# Patient Record
Sex: Male | Born: 1960
Health system: Southern US, Community
[De-identification: ages and names within clinical notes are randomized; demographics above are authoritative.]

## PROBLEM LIST (undated history)

## (undated) DIAGNOSIS — K429 Umbilical hernia without obstruction or gangrene: Secondary | ICD-10-CM

## (undated) DIAGNOSIS — I341 Nonrheumatic mitral (valve) prolapse: Secondary | ICD-10-CM

## (undated) DIAGNOSIS — C801 Malignant (primary) neoplasm, unspecified: Secondary | ICD-10-CM

## (undated) DIAGNOSIS — Z8249 Family history of ischemic heart disease and other diseases of the circulatory system: Secondary | ICD-10-CM

## (undated) DIAGNOSIS — E119 Type 2 diabetes mellitus without complications: Secondary | ICD-10-CM

## (undated) DIAGNOSIS — E785 Hyperlipidemia, unspecified: Secondary | ICD-10-CM

## (undated) DIAGNOSIS — R079 Chest pain, unspecified: Secondary | ICD-10-CM

## (undated) DIAGNOSIS — I77810 Thoracic aortic ectasia: Secondary | ICD-10-CM

## (undated) HISTORY — DX: Nonrheumatic mitral (valve) prolapse: I34.1

## (undated) HISTORY — PX: COLONOSCOPY: SHX174

## (undated) HISTORY — DX: Thoracic aortic ectasia: I77.810

## (undated) HISTORY — DX: Type 2 diabetes mellitus without complications: E11.9

## (undated) HISTORY — PX: HERNIA REPAIR: SHX51

## (undated) HISTORY — DX: Hyperlipidemia, unspecified: E78.5

## (undated) HISTORY — DX: Umbilical hernia without obstruction or gangrene: K42.9

## (undated) HISTORY — DX: Chest pain, unspecified: R07.9

## (undated) HISTORY — DX: Family history of ischemic heart disease and other diseases of the circulatory system: Z82.49

## (undated) HISTORY — PX: NECK SURGERY: SHX720

## (undated) HISTORY — DX: Malignant (primary) neoplasm, unspecified: C80.1

## (undated) HISTORY — PX: PLANTAR FASCIA RELEASE: SHX2239

---

## 1992-07-08 HISTORY — PX: SEPTOPLASTY: SUR1290

## 2001-12-22 ENCOUNTER — Encounter: Admission: RE | Admit: 2001-12-22 | Discharge: 2001-12-22 | Payer: Self-pay | Admitting: *Deleted

## 2001-12-22 ENCOUNTER — Encounter: Payer: Self-pay | Admitting: *Deleted

## 2012-05-20 ENCOUNTER — Encounter (INDEPENDENT_AMBULATORY_CARE_PROVIDER_SITE_OTHER): Payer: Self-pay | Admitting: Surgery

## 2012-05-20 ENCOUNTER — Ambulatory Visit (INDEPENDENT_AMBULATORY_CARE_PROVIDER_SITE_OTHER): Payer: BC Managed Care – PPO | Admitting: Surgery

## 2012-05-20 VITALS — BP 122/68 | HR 70 | Temp 97.9°F | Resp 18 | Ht 74.0 in | Wt 273.2 lb

## 2012-05-20 DIAGNOSIS — K429 Umbilical hernia without obstruction or gangrene: Secondary | ICD-10-CM

## 2012-05-20 NOTE — Patient Instructions (Signed)
If you decide you want to have the umbilical hernia fixed, call our office and we can set this up over the telephone.

## 2012-05-20 NOTE — Progress Notes (Signed)
NAME: Preston Gamble DOB: 1960/07/31 MRN: [id number]                                                                                      DATE: 05/20/2012  PCP: Georgann Housekeeper, MD Referring Provider: No ref. provider found  IMPRESSION:  Small  Reducible umbilical hernia  PLAN:   Discussed repair, risks etc. This is small and unlikely to have an issue with incarceration, so I told him it can be left alone. He does have some pain in it when he exercises so is thinking about having it repaired, but wants to discuss with his wife before deciding                 CC:  Chief Complaint  Patient presents with  . Umbilical Hernia    HPI:  Preston Gamble is a 51 y.o.  male who presents for evaluation of Umbilical hernia.he recently saw his dermatologist who noted a hernia pointed out to him and suggested he obtain surgical consultation. He has a little discomfort in it when he does certain exercises including sit-ups and lifting weights. He's been aware of it for 5 or 6 years but doesn't think it has gotten any bigger. If he is not exercising he really doesn't have many problems.  PMH:  has a past medical history of Chest pain and Umbilical hernia.  PSH:   has no past surgical history on file.  ALLERGIES:  Not on File  MEDICATIONS: Current outpatient prescriptions:glipiZIDE (GLUCOTROL XL) 5 MG 24 hr tablet, Twice daily., Disp: , Rfl: ;  simvastatin (ZOCOR) 20 MG tablet, Daily., Disp: , Rfl:   ROS: He has filled out our 12 point review of systems and it is negative . EXAM:    Vital signs:BP 122/68  Pulse 70  Temp 97.9 F (36.6 C) (Temporal)  Resp 18  Ht 6\' 2"  (1.88 m)  Wt 273 lb 4 oz (123.945 kg)  BMI 35.08 kg/m2 General: Patient alert oriented generally healthy-appearing, NAD. Abdomen: Visible umbilical hernia. Small defect is about a centimeter. It reduces easily and I think has omentum or preperitoneal fat. His abdomen is otherwise benign with no masses tenderness or  organomegaly.  DATA REVIEWED:  No other information available.    Grethel Zenk J 05/20/2012  CC: No ref. provider found, Georgann Housekeeper, MD

## 2012-06-03 ENCOUNTER — Other Ambulatory Visit (INDEPENDENT_AMBULATORY_CARE_PROVIDER_SITE_OTHER): Payer: Self-pay | Admitting: Surgery

## 2012-06-12 ENCOUNTER — Ambulatory Visit: Payer: Self-pay | Admitting: Internal Medicine

## 2012-06-12 ENCOUNTER — Ambulatory Visit: Payer: Self-pay

## 2012-06-12 VITALS — BP 115/75 | HR 77 | Temp 97.8°F | Resp 18 | Ht 74.0 in | Wt 273.0 lb

## 2012-06-12 DIAGNOSIS — R200 Anesthesia of skin: Secondary | ICD-10-CM

## 2012-06-12 DIAGNOSIS — R209 Unspecified disturbances of skin sensation: Secondary | ICD-10-CM

## 2012-06-12 DIAGNOSIS — M542 Cervicalgia: Secondary | ICD-10-CM

## 2012-06-12 DIAGNOSIS — Z23 Encounter for immunization: Secondary | ICD-10-CM

## 2012-06-12 MED ORDER — METHOCARBAMOL 750 MG PO TABS: 750.0000 mg | ORAL_TABLET | Freq: Four times a day (QID) | ORAL | Status: DC

## 2012-06-12 MED ORDER — PREDNISONE 10 MG PO TABS: ORAL_TABLET | ORAL | Status: DC

## 2012-06-12 NOTE — Patient Instructions (Signed)
Prednisone tablets What is this medicine? PREDNISONE (PRED ni sone) is a corticosteroid. It is commonly used to treat inflammation of the skin, joints, lungs, and other organs. Common conditions treated include asthma, allergies, and arthritis. It is also used for other conditions, such as blood disorders and diseases of the adrenal glands. This medicine may be used for other purposes; ask your health care provider or pharmacist if you have questions. What should I tell my health care provider before I take this medicine? They need to know if you have any of these conditions: -Cushing's syndrome -diabetes -glaucoma -heart disease -high blood pressure -infection (especially a virus infection such as chickenpox, cold sores, or herpes) -kidney disease -liver disease -mental illness -myasthenia gravis -osteoporosis -seizures -stomach or intestine problems -thyroid disease -an unusual or allergic reaction to lactose, prednisone, other medicines, foods, dyes, or preservatives -pregnant or trying to get pregnant -breast-feeding How should I use this medicine? Take this medicine by mouth with a Kieara Schwark of water. Follow the directions on the prescription label. Take this medicine with food. If you are taking this medicine once a day, take it in the morning. Do not take more medicine than you are told to take. Do not suddenly stop taking your medicine because you may develop a severe reaction. Your doctor will tell you how much medicine to take. If your doctor wants you to stop the medicine, the dose may be slowly lowered over time to avoid any side effects. Talk to your pediatrician regarding the use of this medicine in children. Special care may be needed. Overdosage: If you think you have taken too much of this medicine contact a poison control center or emergency room at once. NOTE: This medicine is only for you. Do not share this medicine with others. What if I miss a dose? If you miss a dose,  take it as soon as you can. If it is almost time for your next dose, talk to your doctor or health care professional. You may need to miss a dose or take an extra dose. Do not take double or extra doses without advice. What may interact with this medicine? Do not take this medicine with any of the following medications: -metyrapone -mifepristone This medicine may also interact with the following medications: -aminoglutethimide -amphotericin B -aspirin and aspirin-like medicines -barbiturates -certain medicines for diabetes, like glipizide or glyburide -cholestyramine -cholinesterase inhibitors -cyclosporine -digoxin -diuretics -ephedrine -male hormones, like estrogens and birth control pills -isoniazid -ketoconazole -NSAIDS, medicines for pain and inflammation, like ibuprofen or naproxen -phenytoin -rifampin -toxoids -vaccines -warfarin This list may not describe all possible interactions. Give your health care provider a list of all the medicines, herbs, non-prescription drugs, or dietary supplements you use. Also tell them if you smoke, drink alcohol, or use illegal drugs. Some items may interact with your medicine. What should I watch for while using this medicine? Visit your doctor or health care professional for regular checks on your progress. If you are taking this medicine over a prolonged period, carry an identification card with your name and address, the type and dose of your medicine, and your doctor's name and address. This medicine may increase your risk of getting an infection. Tell your doctor or health care professional if you are around anyone with measles or chickenpox, or if you develop sores or blisters that do not heal properly. If you are going to have surgery, tell your doctor or health care professional that you have taken this medicine within the last   twelve months. Ask your doctor or health care professional about your diet. You may need to lower the amount  of salt you eat. This medicine may affect blood sugar levels. If you have diabetes, check with your doctor or health care professional before you change your diet or the dose of your diabetic medicine. What side effects may I notice from receiving this medicine? Side effects that you should report to your doctor or health care professional as soon as possible: -allergic reactions like skin rash, itching or hives, swelling of the face, lips, or tongue -changes in emotions or moods -changes in vision -depressed mood -eye pain -fever or chills, cough, sore throat, pain or difficulty passing urine -increased thirst -swelling of ankles, feet Side effects that usually do not require medical attention (report to your doctor or health care professional if they continue or are bothersome): -confusion, excitement, restlessness -headache -nausea, vomiting -skin problems, acne, thin and shiny skin -trouble sleeping -weight gain This list may not describe all possible side effects. Call your doctor for medical advice about side effects. You may report side effects to FDA at 1-800-FDA-1088. Where should I keep my medicine? Keep out of the reach of children. Store at room temperature between 15 and 30 degrees C (59 and 86 degrees F). Protect from light. Keep container tightly closed. Throw away any unused medicine after the expiration date. NOTE: This sheet is a summary. It may not cover all possible information. If you have questions about this medicine, talk to your doctor, pharmacist, or health care provider.  2012, [employer]. (02/07/2011 10:57:14 AM)

## 2012-06-12 NOTE — Progress Notes (Signed)
  Subjective:    Patient ID: Preston Gamble, male    DOB: 1961/05/18, 51 y.o.   MRN: [id number]  HPI C/o 9 months of tingling and numbness left upper arm. No weakness, no hx serious trauma. Drives and talks on phone all day.   Review of Systems     Objective:   Physical Exam  Constitutional: He is oriented to person, place, and time. He appears well-developed and well-nourished. No distress.  HENT:  Nose: Nose normal.  Neck: Normal range of motion. Neck supple. Muscular tenderness present. No rigidity.    Pulmonary/Chest: Effort normal.  Musculoskeletal:       Cervical back: He exhibits tenderness, pain and spasm. He exhibits no bony tenderness.  Neurological: He is alert and oriented to person, place, and time. He has normal strength. A sensory deficit is present.  Tingling goes down to elbow + spurling left [facility] reading (PRIMARY) by  Dr.Mayara Paulson mild spondylosis c5-6-7        Assessment & Plan:  Neck pain with sensory radiculopathy Need hands free phone use Avoid neck extention Neck manual Prednisone/robaxin MRI if no improvement

## 2012-07-02 ENCOUNTER — Encounter (HOSPITAL_BASED_OUTPATIENT_CLINIC_OR_DEPARTMENT_OTHER): Payer: Self-pay | Admitting: *Deleted

## 2012-07-02 NOTE — Progress Notes (Signed)
To come in for bmet-ekg Has stress test done occ due to family hx-will get last one Does say he snores and wife says her does have occ apnea-he has talked with an ent about doing a study-he will talk with dr Isaac Laud also-did tell him he may need to stay overnight if he has any problems'

## 2012-07-06 ENCOUNTER — Encounter (HOSPITAL_BASED_OUTPATIENT_CLINIC_OR_DEPARTMENT_OTHER)
Admission: RE | Admit: 2012-07-06 | Discharge: 2012-07-06 | Disposition: A | Discharge status: Home / Self Care | Payer: BC Managed Care – PPO | Source: Ambulatory Visit | Attending: Surgery | Admitting: Surgery

## 2012-07-06 LAB — BASIC METABOLIC PANEL
BUN: 16 mg/dL (ref 6–23)
CO2: 23 mEq/L (ref 19–32)
Chloride: 101 mEq/L (ref 96–112)
Glucose, Bld: 252 mg/dL — ABNORMAL HIGH (ref 70–99)
Potassium: 4 mEq/L (ref 3.5–5.1)

## 2012-07-09 ENCOUNTER — Encounter (HOSPITAL_BASED_OUTPATIENT_CLINIC_OR_DEPARTMENT_OTHER)
Admission: RE | Disposition: A | Discharge status: Home / Self Care | Payer: Self-pay | Source: Ambulatory Visit | Attending: Surgery

## 2012-07-09 ENCOUNTER — Encounter (HOSPITAL_BASED_OUTPATIENT_CLINIC_OR_DEPARTMENT_OTHER): Payer: Self-pay | Admitting: *Deleted

## 2012-07-09 ENCOUNTER — Ambulatory Visit (HOSPITAL_BASED_OUTPATIENT_CLINIC_OR_DEPARTMENT_OTHER)
Admission: RE | Admit: 2012-07-09 | Discharge: 2012-07-09 | Disposition: A | Discharge status: Home / Self Care | Payer: BC Managed Care – PPO | Source: Ambulatory Visit | Attending: Surgery | Admitting: Surgery

## 2012-07-09 ENCOUNTER — Encounter (HOSPITAL_BASED_OUTPATIENT_CLINIC_OR_DEPARTMENT_OTHER): Payer: Self-pay | Admitting: Certified Registered Nurse Anesthetist

## 2012-07-09 ENCOUNTER — Ambulatory Visit (HOSPITAL_BASED_OUTPATIENT_CLINIC_OR_DEPARTMENT_OTHER): Payer: BC Managed Care – PPO | Admitting: Certified Registered Nurse Anesthetist

## 2012-07-09 DIAGNOSIS — Z01810 Encounter for preprocedural cardiovascular examination: Secondary | ICD-10-CM | POA: Insufficient documentation

## 2012-07-09 DIAGNOSIS — Z01812 Encounter for preprocedural laboratory examination: Secondary | ICD-10-CM | POA: Insufficient documentation

## 2012-07-09 DIAGNOSIS — Z87891 Personal history of nicotine dependence: Secondary | ICD-10-CM | POA: Insufficient documentation

## 2012-07-09 DIAGNOSIS — K429 Umbilical hernia without obstruction or gangrene: Secondary | ICD-10-CM

## 2012-07-09 DIAGNOSIS — E119 Type 2 diabetes mellitus without complications: Secondary | ICD-10-CM | POA: Insufficient documentation

## 2012-07-09 HISTORY — PX: UMBILICAL HERNIA REPAIR: SHX196

## 2012-07-09 LAB — POCT HEMOGLOBIN-HEMACUE
Hemoglobin: 14.5 g/dL (ref 13.0–17.0)
Hemoglobin: 18.1 g/dL — ABNORMAL HIGH (ref 13.0–17.0)

## 2012-07-09 SURGERY — REPAIR, HERNIA, UMBILICAL, ADULT
Anesthesia: General | Site: Abdomen | Wound class: Clean

## 2012-07-09 MED ORDER — FENTANYL CITRATE 0.05 MG/ML IJ SOLN
INTRAMUSCULAR | Status: DC | PRN
  Administered 2012-07-09: 25 ug via INTRAVENOUS
  Administered 2012-07-09: 50 ug via INTRAVENOUS
  Administered 2012-07-09 (×2): 25 ug via INTRAVENOUS

## 2012-07-09 MED ORDER — ONDANSETRON HCL 4 MG/2ML IJ SOLN
INTRAMUSCULAR | Status: DC | PRN
  Administered 2012-07-09: 4 mg via INTRAVENOUS

## 2012-07-09 MED ORDER — CHLORHEXIDINE GLUCONATE 4 % EX LIQD: 1.0000 "application " | Freq: Once | CUTANEOUS | Status: DC

## 2012-07-09 MED ORDER — CEFAZOLIN SODIUM-DEXTROSE 2-3 GM-% IV SOLR
2.0000 g | INTRAVENOUS | Status: AC
  Administered 2012-07-09: 2 g via INTRAVENOUS

## 2012-07-09 MED ORDER — LIDOCAINE HCL (CARDIAC) 20 MG/ML IV SOLN
INTRAVENOUS | Status: DC | PRN
  Administered 2012-07-09: 60 mg via INTRAVENOUS

## 2012-07-09 MED ORDER — BUPIVACAINE HCL (PF) 0.25 % IJ SOLN
INTRAMUSCULAR | Status: DC | PRN
  Administered 2012-07-09: 20 mL

## 2012-07-09 MED ORDER — FENTANYL CITRATE 0.05 MG/ML IJ SOLN
25.0000 ug | INTRAMUSCULAR | Status: DC | PRN
  Administered 2012-07-09: 50 ug via INTRAVENOUS

## 2012-07-09 MED ORDER — HYDROCODONE-ACETAMINOPHEN 5-325 MG PO TABS: 1.0000 | ORAL_TABLET | ORAL | Status: DC | PRN

## 2012-07-09 MED ORDER — OXYCODONE HCL 5 MG/5ML PO SOLN: 5.0000 mg | Freq: Once | ORAL | Status: DC | PRN

## 2012-07-09 MED ORDER — PROPOFOL 10 MG/ML IV BOLUS
INTRAVENOUS | Status: DC | PRN
  Administered 2012-07-09: 260 mg via INTRAVENOUS

## 2012-07-09 MED ORDER — ONDANSETRON HCL 4 MG/2ML IJ SOLN: 4.0000 mg | Freq: Four times a day (QID) | INTRAMUSCULAR | Status: DC | PRN

## 2012-07-09 MED ORDER — MIDAZOLAM HCL 5 MG/5ML IJ SOLN
INTRAMUSCULAR | Status: DC | PRN
  Administered 2012-07-09: 2 mg via INTRAVENOUS

## 2012-07-09 MED ORDER — DEXAMETHASONE SODIUM PHOSPHATE 4 MG/ML IJ SOLN
INTRAMUSCULAR | Status: DC | PRN
  Administered 2012-07-09: 4 mg via INTRAVENOUS

## 2012-07-09 MED ORDER — LACTATED RINGERS IV SOLN
INTRAVENOUS | Status: DC
  Administered 2012-07-09 (×2): via INTRAVENOUS

## 2012-07-09 MED ORDER — OXYCODONE HCL 5 MG PO TABS
5.0000 mg | ORAL_TABLET | Freq: Once | ORAL | Status: DC | PRN
Start: 2012-07-09 — End: 2012-07-09

## 2012-07-09 SURGICAL SUPPLY — 54 items
ADH SKN CLS APL DERMABOND .7 (GAUZE/BANDAGES/DRESSINGS)
APL SKNCLS STERI-STRIP NONHPOA (GAUZE/BANDAGES/DRESSINGS)
BALL CTTN LRG ABS STRL LF (GAUZE/BANDAGES/DRESSINGS) ×1
BENZOIN TINCTURE PRP APPL 2/3 (GAUZE/BANDAGES/DRESSINGS) IMPLANT
BLADE HEX COATED 2.75 (ELECTRODE) ×2 IMPLANT
BLADE SURG 15 STRL LF DISP TIS (BLADE) ×1 IMPLANT
BLADE SURG 15 STRL SS (BLADE) ×2
BLADE SURG ROTATE 9660 (MISCELLANEOUS) ×1 IMPLANT
CANISTER SUCTION 1200CC (MISCELLANEOUS) IMPLANT
CHLORAPREP W/TINT 26ML (MISCELLANEOUS) ×2 IMPLANT
CLOTH BEACON ORANGE TIMEOUT ST (SAFETY) ×2 IMPLANT
COTTONBALL LRG STERILE PKG (GAUZE/BANDAGES/DRESSINGS) ×2 IMPLANT
COVER MAYO STAND STRL (DRAPES) ×2 IMPLANT
COVER TABLE BACK 60X90 (DRAPES) ×2 IMPLANT
DECANTER SPIKE VIAL GLASS SM (MISCELLANEOUS) ×2 IMPLANT
DERMABOND ADVANCED (GAUZE/BANDAGES/DRESSINGS)
DERMABOND ADVANCED .7 DNX12 (GAUZE/BANDAGES/DRESSINGS) IMPLANT
DRAPE PED LAPAROTOMY (DRAPES) ×2 IMPLANT
DRAPE UTILITY XL STRL (DRAPES) ×2 IMPLANT
ELECT REM PT RETURN 9FT ADLT (ELECTROSURGICAL) ×2
ELECTRODE REM PT RTRN 9FT ADLT (ELECTROSURGICAL) ×1 IMPLANT
GAUZE SPONGE 4X4 12PLY STRL LF (GAUZE/BANDAGES/DRESSINGS) ×4 IMPLANT
GLOVE EUDERMIC 7 POWDERFREE (GLOVE) ×2 IMPLANT
GOWN PREVENTION PLUS XLARGE (GOWN DISPOSABLE) ×4 IMPLANT
MESH HERNIA 3X6 (Mesh General) ×1 IMPLANT
NDL HYPO 25X1 1.5 SAFETY (NEEDLE) ×1 IMPLANT
NEEDLE HYPO 25X1 1.5 SAFETY (NEEDLE) ×2 IMPLANT
NS IRRIG 1000ML POUR BTL (IV SOLUTION) ×1 IMPLANT
PACK BASIN DAY SURGERY FS (CUSTOM PROCEDURE TRAY) ×2 IMPLANT
PENCIL BUTTON HOLSTER BLD 10FT (ELECTRODE) ×2 IMPLANT
SLEEVE SCD COMPRESS KNEE MED (MISCELLANEOUS) ×1 IMPLANT
SPONGE LAP 4X18 X RAY DECT (DISPOSABLE) ×2 IMPLANT
STRIP CLOSURE SKIN 1/2X4 (GAUZE/BANDAGES/DRESSINGS) IMPLANT
SUT CHROMIC 2 0 SH (SUTURE) IMPLANT
SUT MNCRL AB 4-0 PS2 18 (SUTURE) ×2 IMPLANT
SUT PROLENE 0 CT 1 CR/8 (SUTURE) ×2 IMPLANT
SUT PROLENE 0 CT 2 (SUTURE) IMPLANT
SUT PROLENE 2 0 CT2 30 (SUTURE) IMPLANT
SUT SILK 2 0 TIES 17X18 (SUTURE)
SUT SILK 2-0 18XBRD TIE BLK (SUTURE) IMPLANT
SUT SILK 4 0 TIES 17X18 (SUTURE) IMPLANT
SUT VIC AB 2-0 CT1 27 (SUTURE)
SUT VIC AB 2-0 CT1 TAPERPNT 27 (SUTURE) IMPLANT
SUT VIC AB 3-0 54X BRD REEL (SUTURE) IMPLANT
SUT VIC AB 3-0 BRD 54 (SUTURE)
SUT VIC AB 3-0 CT1 27 (SUTURE) ×2
SUT VIC AB 3-0 CT1 27XBRD (SUTURE) ×1 IMPLANT
SUT VICRYL 3-0 CR8 SH (SUTURE) IMPLANT
SYR CONTROL 10ML LL (SYRINGE) ×2 IMPLANT
TOWEL OR 17X24 6PK STRL BLUE (TOWEL DISPOSABLE) ×2 IMPLANT
TOWEL OR NON WOVEN STRL DISP B (DISPOSABLE) ×2 IMPLANT
TUBE CONNECTING 20X1/4 (TUBING) IMPLANT
WATER STERILE IRR 1000ML POUR (IV SOLUTION) ×1 IMPLANT
YANKAUER SUCT BULB TIP NO VENT (SUCTIONS) IMPLANT

## 2012-07-09 NOTE — H&P (Signed)
HPI: Preston Gamble is a 52 y.o. male who presents for surgical repair of an Umbilical hernia.he recently saw his dermatologist who noted a hernia pointed out to him and suggested he obtain surgical consultation. He has a little discomfort in it when he does certain exercises including sit-ups and lifting weights. He's been aware of it for 5 or 6 years but doesn't think it has gotten any bigger. If he is not exercising he really doesn't have many problems.  PMH: has a past medical history of Chest pain and Umbilical hernia.  PSH: has no past surgical history on file.  ALLERGIES: Not on File  MEDICATIONS: Current outpatient prescriptions:glipiZIDE (GLUCOTROL XL) 5 MG 24 hr tablet, Twice daily., Disp: , Rfl: ; simvastatin (ZOCOR) 20 MG tablet, Daily., Disp: , Rfl:  ROS: He  has filled out our 12 point review of systems and it is negative .  EXAM:  Vital signs:BP 125/85  Pulse 68  Temp 97.7 F (36.5 C) (Oral)  Resp 18  Ht 6\' 2"  (1.88 m)  Wt 269 lb 3.2 oz (122.108 kg)  BMI 34.56 kg/m2  SpO2 96%  General: Patient alert oriented generally healthy-appearing, NAD.  Lungs: Clear to auscultation Heart: Reg, no M,R,G. Abdomen: Visible umbilical hernia. Small defect is about a centimeter. It reduces easily and I think has omentum or preperitoneal fat. His abdomen is otherwise benign with no masses tenderness or organomegaly  IMP: Reducible umbilical hernia  PLAN: Repair. Discussed with patient and his wife and all questions answered

## 2012-07-09 NOTE — Anesthesia Preprocedure Evaluation (Signed)
Anesthesia Evaluation  Patient identified by MRN, date of birth, ID band Patient awake    Reviewed: Allergy & Precautions, H&P , NPO status , Patient's Chart, lab work & pertinent test results  Airway Mallampati: II  Neck ROM: full    Dental   Pulmonary former smoker,          Cardiovascular     Neuro/Psych    GI/Hepatic   Endo/Other  diabetes, Type 2obese  Renal/GU      Musculoskeletal   Abdominal   Peds  Hematology   Anesthesia Other Findings   Reproductive/Obstetrics                           Anesthesia Physical Anesthesia Plan  ASA: II  Anesthesia Plan: General   Post-op Pain Management:    Induction: Intravenous  Airway Management Planned: LMA  Additional Equipment:   Intra-op Plan:   Post-operative Plan:   Informed Consent: I have reviewed the patients History and Physical, chart, labs and discussed the procedure including the risks, benefits and alternatives for the proposed anesthesia with the patient or authorized representative who has indicated his/her understanding and acceptance.     Plan Discussed with: CRNA and Surgeon  Anesthesia Plan Comments:         Anesthesia Quick Evaluation

## 2012-07-09 NOTE — Transfer of Care (Signed)
Immediate Anesthesia Transfer of Care Note  Patient: Preston Gamble  Procedure(s) Performed: Procedure(s) (LRB) with comments: HERNIA REPAIR UMBILICAL ADULT (N/A) - repair umbilical hernia  Patient Location: PACU  Anesthesia Type:General  Level of Consciousness: awake and patient cooperative  Airway & Oxygen Therapy: Patient Spontanous Breathing and Patient connected to face mask oxygen  Post-op Assessment: Report given to PACU RN and Post -op Vital signs reviewed and stable  Post vital signs: Reviewed and stable  Complications: No apparent anesthesia complications

## 2012-07-09 NOTE — Op Note (Signed)
TATSUO MUSIAL [birth year] [id number] 06/09/2012  Preoperative diagnosis: reducible umbilical hernia  Postoperative diagnosis: same  Procedure: Umbilical Hernia Repair  Surgeon: Currie Paris, MD, FACS  Anesthesia: General   Clinical History and Indications: this patient has a small umbilical hernia that he wished to have repaired.  Procedure: The patient was seen in the preoperative area and the plans for the procedure reviewed again. He  had no further questions. I marked the area of the umbilicus as the operative site. He wishes to prodeed.  The patient was taken to the operating room and after satisfactory Gen. anesthesia had been obtained the area was clipped as needed, prepped and draped. The timeout was performed.  I used some 0.25% plain Marcaine anesthesia to help with postoperative pain management. This was infiltrated around the umbilical area and additional infiltrated as I worked.  A curvilinear incision was made on the inferior aspect of the umbilicus. The umbilical skin was elevated off of the hernia sac. The hernia sac was dissected free of the subcutaneous tissues.  There is some omentum protruding through which was freed up from the fascia and reduced. The fascial defect was about 1.5 cm. Once I had everything reduced I elevated the subcutaneous tissue off of the fascia for placement of some onlay mesh. The fascial defect was closed with interrupted 2-0 Prolene. A circular piece of mesh was then attached to the closing sutures and then packed laterally around the periphery with some additional 0 Prolene.  Once the repair was complete the incision was closed by using some 3-0 Vicryl subcutaneous and 4-0 Monocryl subcuticular sutures.  The patient tolerated the procedure well. There were no operative complications. There was minimal blood loss. All counts were correct. He was taken to the PACU in satisfactory condition.  Currie Paris, MD,  FACS 07/09/2012 8:12 AM

## 2012-07-09 NOTE — Anesthesia Postprocedure Evaluation (Signed)
Anesthesia Post Note  Patient: Preston Gamble  Procedure(s) Performed: Procedure(s) (LRB): HERNIA REPAIR UMBILICAL ADULT (N/A)  Anesthesia type: General  Patient location: PACU  Post pain: Pain level controlled and Adequate analgesia  Post assessment: Post-op Vital signs reviewed, Patient's Cardiovascular Status Stable, Respiratory Function Stable, Patent Airway and Pain level controlled  Last Vitals:  Filed Vitals:   07/09/12 1000  BP:   Pulse: 61  Temp: 36.4 C  Resp:     Post vital signs: Reviewed and stable  Level of consciousness: awake, alert  and oriented  Complications: No apparent anesthesia complications

## 2012-07-09 NOTE — Anesthesia Procedure Notes (Signed)
Procedure Name: LMA Insertion Date/Time: 07/09/2012 7:28 AM Performed by: Kavan Devan D Pre-anesthesia Checklist: Patient identified, Emergency Drugs available, Suction available and Patient being monitored Patient Re-evaluated:Patient Re-evaluated prior to inductionOxygen Delivery Method: Circle System Utilized Preoxygenation: Pre-oxygenation with 100% oxygen Intubation Type: IV induction Ventilation: Mask ventilation without difficulty LMA: LMA inserted LMA Size: 5.0 Number of attempts: 1 Placement Confirmation: positive ETCO2 Tube secured with: Tape Dental Injury: Teeth and Oropharynx as per pre-operative assessment

## 2012-07-13 ENCOUNTER — Encounter (HOSPITAL_BASED_OUTPATIENT_CLINIC_OR_DEPARTMENT_OTHER): Payer: Self-pay | Admitting: Surgery

## 2012-07-28 ENCOUNTER — Encounter (INDEPENDENT_AMBULATORY_CARE_PROVIDER_SITE_OTHER): Payer: BC Managed Care – PPO | Admitting: Surgery

## 2012-07-31 ENCOUNTER — Ambulatory Visit (INDEPENDENT_AMBULATORY_CARE_PROVIDER_SITE_OTHER): Payer: BC Managed Care – PPO | Admitting: Surgery

## 2012-07-31 ENCOUNTER — Encounter (INDEPENDENT_AMBULATORY_CARE_PROVIDER_SITE_OTHER): Payer: Self-pay | Admitting: Surgery

## 2012-07-31 VITALS — BP 122/82 | HR 69 | Temp 97.0°F | Resp 16 | Ht 74.0 in | Wt 272.6 lb

## 2012-07-31 DIAGNOSIS — Z09 Encounter for follow-up examination after completed treatment for conditions other than malignant neoplasm: Secondary | ICD-10-CM

## 2012-07-31 NOTE — Progress Notes (Signed)
NAME: Preston Gamble                                            DOB: [birth year] DATE: 07/31/2012                                                  MRN: [id number]  CC: Post op   HPI: This patient comes in for post op follow-up .Heunderwent UH repair on 07/09/12 . He feels that he is doing well.  PE:  VITAL SIGNS: BP 122/82  Pulse 69  Temp 97 F (36.1 C) (Temporal)  Resp 16  Ht 6\' 2"  (1.88 m)  Wt 272 lb 9.6 oz (123.651 kg)  BMI 35.00 kg/m2  General: The patient appears to be healthy, NAD Abd: Soft and benign   IMPRESSION: The patient is doing well S/P UH Repair.    PLAN: RTC PRN. Limited activities for a few weeks

## 2012-07-31 NOTE — Patient Instructions (Signed)
We will see you again on an as needed basis. Please call the office at [phone] if you have any questions or concerns. Thank you for allowing us to take care of you.  

## 2012-12-21 ENCOUNTER — Ambulatory Visit: Payer: BC Managed Care – PPO | Admitting: Internal Medicine

## 2013-03-17 ENCOUNTER — Encounter: Payer: Self-pay | Admitting: Gastroenterology

## 2013-05-03 ENCOUNTER — Ambulatory Visit (AMBULATORY_SURGERY_CENTER): Payer: Self-pay

## 2013-05-03 VITALS — Ht 74.0 in | Wt 260.0 lb

## 2013-05-03 DIAGNOSIS — Z1211 Encounter for screening for malignant neoplasm of colon: Secondary | ICD-10-CM

## 2013-05-03 MED ORDER — MOVIPREP 100 G PO SOLR: 1.0000 | Freq: Once | ORAL | Status: DC

## 2013-05-03 NOTE — Progress Notes (Signed)
Pt stresses he wants "to be down"/completely out for procedure.

## 2013-05-04 ENCOUNTER — Encounter: Payer: Self-pay | Admitting: Gastroenterology

## 2013-05-17 ENCOUNTER — Ambulatory Visit (AMBULATORY_SURGERY_CENTER): Payer: BC Managed Care – PPO | Admitting: Gastroenterology

## 2013-05-17 ENCOUNTER — Encounter: Payer: Self-pay | Admitting: Gastroenterology

## 2013-05-17 VITALS — BP 116/69 | HR 53 | Temp 96.8°F | Resp 15 | Ht 74.0 in | Wt 260.0 lb

## 2013-05-17 DIAGNOSIS — D126 Benign neoplasm of colon, unspecified: Secondary | ICD-10-CM

## 2013-05-17 DIAGNOSIS — Z1211 Encounter for screening for malignant neoplasm of colon: Secondary | ICD-10-CM

## 2013-05-17 DIAGNOSIS — K5730 Diverticulosis of large intestine without perforation or abscess without bleeding: Secondary | ICD-10-CM

## 2013-05-17 LAB — GLUCOSE, CAPILLARY

## 2013-05-17 MED ORDER — SODIUM CHLORIDE 0.9 % IV SOLN: 500.0000 mL | INTRAVENOUS | Status: DC

## 2013-05-17 NOTE — Patient Instructions (Signed)
Discharge instructions given with verbal understanding. Handouts on polyps and diverticulosis. Resume previous medications. YOU HAD AN ENDOSCOPIC PROCEDURE TODAY AT THE  ENDOSCOPY CENTER: Refer to the procedure report that was given to you for any specific questions about what was found during the examination.  If the procedure report does not answer your questions, please call your gastroenterologist to clarify.  If you requested that your care partner not be given the details of your procedure findings, then the procedure report has been included in a sealed envelope for you to review at your convenience later.  YOU SHOULD EXPECT: Some feelings of bloating in the abdomen. Passage of more gas than usual.  Walking can help get rid of the air that was put into your GI tract during the procedure and reduce the bloating. If you had a lower endoscopy (such as a colonoscopy or flexible sigmoidoscopy) you may notice spotting of blood in your stool or on the toilet paper. If you underwent a bowel prep for your procedure, then you may not have a normal bowel movement for a few days.  DIET: Your first meal following the procedure should be a light meal and then it is ok to progress to your normal diet.  A half-sandwich or bowl of soup is an example of a good first meal.  Heavy or fried foods are harder to digest and may make you feel nauseous or bloated.  Likewise meals heavy in dairy and vegetables can cause extra gas to form and this can also increase the bloating.  Drink plenty of fluids but you should avoid alcoholic beverages for 24 hours.  ACTIVITY: Your care partner should take you home directly after the procedure.  You should plan to take it easy, moving slowly for the rest of the day.  You can resume normal activity the day after the procedure however you should NOT DRIVE or use heavy machinery for 24 hours (because of the sedation medicines used during the test).    SYMPTOMS TO REPORT  IMMEDIATELY: A gastroenterologist can be reached at any hour.  During normal business hours, 8:30 AM to 5:00 PM Monday through Friday, call [phone].  After hours and on weekends, please call the GI answering service at [phone] who will take a message and have the physician on call contact you.   Following lower endoscopy (colonoscopy or flexible sigmoidoscopy):  Excessive amounts of blood in the stool  Significant tenderness or worsening of abdominal pains  Swelling of the abdomen that is new, acute  Fever of 100F or higher  FOLLOW UP: If any biopsies were taken you will be contacted by phone or by letter within the next 1-3 weeks.  Call your gastroenterologist if you have not heard about the biopsies in 3 weeks.  Our staff will call the home number listed on your records the next business day following your procedure to check on you and address any questions or concerns that you may have at that time regarding the information given to you following your procedure. This is a courtesy call and so if there is no answer at the home number and we have not heard from you through the emergency physician on call, we will assume that you have returned to your regular daily activities without incident.  SIGNATURES/CONFIDENTIALITY: You and/or your care partner have signed paperwork which will be entered into your electronic medical record.  These signatures attest to the fact that that the information above on your After Visit Summary   has been reviewed and is understood.  Full responsibility of the confidentiality of this discharge information lies with you and/or your care-partner. 

## 2013-05-17 NOTE — Progress Notes (Signed)
The pt tolerated the colonoscopy very well. Maw   

## 2013-05-17 NOTE — Op Note (Signed)
[facility] [address] [place], [address]   COLONOSCOPY PROCEDURE REPORT  PATIENT: Preston Gamble, Preston Gamble  MR#: [id number] BIRTHDATE: 09/13/1960 , 52  yrs. old GENDER: Male ENDOSCOPIST: Rachael Fee, MD REFERRED XB:[id number] Tisovec, M.D. PROCEDURE DATE:  05/17/2013 PROCEDURE:   Colonoscopy with snare polypectomy First Screening Colonoscopy - Avg.  risk and is 50 yrs.  old or older Yes.  Prior Negative Screening - Now for repeat screening. N/A  History of Adenoma - Now for follow-up colonoscopy & has been > or = to 3 yrs.  N/A  Polyps Removed Today? Yes. ASA CLASS:   Class II INDICATIONS:average risk screening. MEDICATIONS: Fentanyl 75 mcg IV, Versed 7 mg IV, and These medications were titrated to patient response per physician's verbal order  DESCRIPTION OF PROCEDURE:   After the risks benefits and alternatives of the procedure were thoroughly explained, informed consent was obtained.  A digital rectal exam revealed no abnormalities of the rectum.   The LB [id number]  endoscope was introduced through the anus and advanced to the cecum, which was identified by both the appendix and ileocecal valve. No adverse events experienced.   The quality of the prep was good.  The instrument was then slowly withdrawn as the colon was fully examined.  COLON FINDINGS: Five polyps were found, removed and all were sent to pathology.  Four of them were sessile, ranging in size from 3mm to 6mm, located in descending and sigmoid segments, all were completely removed with cold snare (path jar 1).  The last was pedunculated, 11mm across, located in proximal rectum, completely removed with snare/cautery, path jar 1.  There were numerous diverticulum in the left colon.  The examination was otherwise normal.  Retroflexed views revealed no abnormalities. The time to cecum=3 minutes 35 seconds.  Withdrawal time=15 minutes 15 seconds. The scope was withdrawn and the procedure  completed. COMPLICATIONS: There were no complications. ENDOSCOPIC IMPRESSION: Five polyps were found, removed and all were sent to pathology. There were numerous diverticulum in the left colon. The examination was otherwise normal.  RECOMMENDATIONS: If the polyp(s) removed today are proven to be adenomatous (pre-cancerous) polyps, you will need a colonoscopy in 3 years. Otherwise you should continue to follow colorectal cancer screening guidelines for "routine risk" patients with a colonoscopy in 10 years.  You will receive a letter within 1-2 weeks with the results of your biopsy as well as final recommendations.  Please call my office if you have not received a letter after 3 weeks.   eSigned:  Rachael Fee, MD 05/17/2013 8:27 AM

## 2013-05-17 NOTE — Progress Notes (Signed)
Patient did not experience any of the following events: a burn prior to discharge; a fall within the facility; wrong site/side/patient/procedure/implant event; or a hospital transfer or hospital admission upon discharge from the facility. (G8907) Patient did not have preoperative order for IV antibiotic SSI prophylaxis. (G8918)  

## 2013-05-18 ENCOUNTER — Telehealth: Payer: Self-pay | Admitting: *Deleted

## 2013-05-18 NOTE — Telephone Encounter (Signed)
  Follow up Call-  Call back number 05/17/2013  Post procedure Call Back phone  # [phone]  Permission to leave phone message Yes     Patient questions:  Do you have a fever, pain , or abdominal swelling? no Pain Score  0 *  Have you tolerated food without any problems? yes  Have you been able to return to your normal activities? yes  Do you have any questions about your discharge instructions: Diet   no Medications  no Follow up visit  no  Do you have questions or concerns about your Care? no  Actions: * If pain score is 4 or above: No action needed, pain <4.

## 2013-05-21 ENCOUNTER — Encounter: Payer: Self-pay | Admitting: Gastroenterology

## 2013-07-06 ENCOUNTER — Ambulatory Visit (INDEPENDENT_AMBULATORY_CARE_PROVIDER_SITE_OTHER): Payer: BC Managed Care – PPO | Admitting: Family Medicine

## 2013-07-06 VITALS — BP 126/72 | HR 67 | Temp 98.4°F | Resp 16 | Ht 74.0 in | Wt 262.8 lb

## 2013-07-06 DIAGNOSIS — M79609 Pain in unspecified limb: Secondary | ICD-10-CM

## 2013-07-06 DIAGNOSIS — S86112A Strain of other muscle(s) and tendon(s) of posterior muscle group at lower leg level, left leg, initial encounter: Secondary | ICD-10-CM

## 2013-07-06 DIAGNOSIS — S838X9A Sprain of other specified parts of unspecified knee, initial encounter: Secondary | ICD-10-CM

## 2013-07-06 DIAGNOSIS — M79662 Pain in left lower leg: Secondary | ICD-10-CM

## 2013-07-06 NOTE — Progress Notes (Addendum)
Subjective:    Patient ID: Preston Gamble, male    DOB: 08-04-1960, 52 y.o.   MRN: [id number]  This chart was scribed for Preston Flood, MD by Blanchard Kelch, ED Scribe. The patient was seen in room 2. Patient's care was started at 7:57 PM.  Chief Complaint  Patient presents with  . Leg Pain    left calf pulled while playing basketball   PCP: Gaspar Garbe, MD  HPI  Preston Gamble is a 52 y.o. male who presents to office complaining of a left calf injury that occurred about three hours ago. He states he was playing basketball with some 50 year old boys when he heard a "pop" and felt pain in his left calf immediately after. The pain is worsened with movement of his foot. He has been icing the area with mild relief. He believes he was just standing still when it occurred.  He states it felt like he was hit with a basketball in his leg. He denies falling. He is able to walk but cannot put pressure on the leg without pain. He denies any prior injury or problem with the leg.   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   There are no active problems to display for this patient.  Past Medical History  Diagnosis Date  . Chest pain     due to injury  . Umbilical hernia   . Diabetes mellitus without complication   . Hyperlipidemia    Past Surgical History  Procedure Laterality Date  . Septoplasty  1994  . Umbilical hernia repair  07/09/2012    Procedure: HERNIA REPAIR UMBILICAL ADULT;  Surgeon: Currie Paris, MD;  Location: [facility];  Service: General;  Laterality: N/A;  repair umbilical hernia   No Known Allergies Prior to Admission medications   Medication Sig Start Date End Date Taking? Authorizing Provider  aspirin 81 MG tablet Take 81 mg by mouth daily.   Yes Historical Provider, MD  atorvastatin (LIPITOR) 40 MG tablet  04/18/13  Yes Historical Provider, MD  JENTADUETO 2.11-998 MG TABS  05/03/13  Yes Historical Provider, MD   History    Social History  . Marital Status: Married    Spouse Name: N/A    Number of Children: N/A  . Years of Education: N/A   Occupational History  . Not on file.   Social History Main Topics  . Smoking status: Former Smoker    Quit date: 05/20/1997  . Smokeless tobacco: Never Used  . Alcohol Use: 1.8 oz/week    3 Cans of beer per week     Comment: occasional  . Drug Use: No  . Sexual Activity: Not on file   Other Topics Concern  . Not on file   Social History Narrative  . No narrative on file   Review of Systems  Constitutional: Negative for fever.  HENT: Negative for drooling.   Eyes: Negative for discharge.  Respiratory: Negative for cough.   Cardiovascular: Negative for leg swelling.  Gastrointestinal: Negative for vomiting.  Endocrine: Negative for polyuria.  Genitourinary: Negative for hematuria.  Musculoskeletal: Positive for arthralgias and gait problem.  Skin: Negative for rash.  Allergic/Immunologic: Negative for immunocompromised state.  Neurological: Negative for speech difficulty.  Hematological: Negative for adenopathy.  Psychiatric/Behavioral: Negative for confusion.       Objective:   Physical Exam  Nursing note and vitals reviewed. Constitutional: He is oriented to person, place, and time. He appears  well-developed and well-nourished. No distress.  HENT:  Head: Normocephalic and atraumatic.  Eyes: EOM are normal.  Neck: Neck supple. No tracheal deviation present.  Cardiovascular: Normal rate.   Pulmonary/Chest: Effort normal. No respiratory distress.  Musculoskeletal: Normal range of motion. He exhibits tenderness.  Achilles non tender. Tender to palpation on the medial lower gastroc. Lateral gastroc non tender. Minimal soft tissue swelling at affected area without palpable defect. Tibia non tender.  Neurological: He is alert and oriented to person, place, and time.  Skin: Skin is warm and dry.  Psychiatric: He has a normal mood and affect. His  behavior is normal.   NVI distally with warm toes. Negative thompsons.Ceasar Mons Vitals:   07/06/13 1925  BP: 126/72  Pulse: 67  Temp: 98.4 F (36.9 C)  TempSrc: Oral  Resp: 16  Height: 6\' 2"  (1.88 m)  Weight: 262 lb 12.8 oz (119.205 kg)  SpO2: 100%       Assessment & Plan:  Preston Gamble is a 52 y.o. male Gastrocnemius muscle strain, left, initial encounter  Calf pain, left  No defect appreciated and strength with plantar flexion noted. Will cont compression with Ace bandage,cam walker, crutches until less pain with ambulation, HEP when improving as below. Recheck in 2 weeks. PRICE tx discussed and compartment syndrome precautions reviewed.     No orders of the defined types were placed in this encounter.   Patient Instructions  Ice, wrap with ACE bandage, elevate and crutches if needed. Ibuprofen over the counter if needed. See other information below. Recheck in next 2 weeks. Return to the clinic or go to the nearest emergency room if any of your symptoms worsen or new symptoms occur. Watch for any signs of compartment syndrome as discussed (numbness, cold toes, increased pain - go to ER if these occur).  Medial Head Gastrocnemius Tear (Tennis Leg)  with Rehab Medial head gastrocnemius tear, also called tennis leg, is a tear (strain) in a muscle or tendon of the inner portion (medial head) of one of the calf muscles (gastrocnemius). The inner portion of the calf muscle attaches to the thigh bone (femur) and is responsible for bending the knee and straightening the foot (standing on "tippy toes"). Strains are classified into three categories. Grade 1 strains cause pain, but the tendon is not lengthened. Grade 2 strains include a lengthened ligament, due to the ligament being stretched or partially ruptured. With grade 2 strains there is still function, although function may be decreased. Grade 3 strains involve a complete tear of the tendon or muscle, and function is usually  impaired. SYMPTOMS   Sudden "pop" or tear felt at the time of injury.  Pain, tenderness, swelling, warmth, or redness over the middle inner calf.  Pain and weakness with ankle motion, especially flexing the ankle against resistance, as well as pain with lifting the foot up (extending the ankle).  Bruising (contusion) of the calf, heel and, sometimes, foot within 48 hours of injury.  Muscle spasm in the calf. CAUSES  Muscle and ligament strains occur when a force is placed on the muscle or ligament that is greater than it can handle. Common causes of injury include:  Direct hit (trauma) to the calf.  Sudden forceful pushing off or landing on the foot (jumping, landing, serving a tennis ball, lunging). RISK INCREASES WITH:  Sports that require sudden, explosive calf muscle contraction, such as those involving jumping (basketball), hill running, quick starts (running), or lunging (racquetball, tennis).  Contact sports (  football, soccer, hockey).  Poor strength and flexibility.  Previous lower limb injury. PREVENTION  Warm up and stretch properly before activity.  Allow for adequate recovery between workouts.  Maintain physical fitness:  Strength, flexibility, and endurance.  Cardiovascular fitness.  Learn and use proper exercise technique.  Complete rehabilitation after lower limb injury, before returning to competition or practice. PROGNOSIS  If treated properly, tennis leg usually heals within 6 weeks of non-surgical treatment.  RELATED COMPLICATIONS   Longer healing time, if not properly treated or if not given enough time to heal.  Recurring symptoms and injury, if activity is resumed too soon, with overuse, with a direct blow, or with poor technique.  If untreated, may progress to a complete tear (rare) or other injury, due to limping and favoring of the injured leg.  Persistent limping, due to scarring and shortening of the calf muscles, as a result of  inadequate rehabilitation.  Prolonged disability. TREATMENT  Treatment first involves the use of ice and medication to help reduce pain and inflammation. The use of strengthening and stretching exercises may help reduce pain with activity. These exercises may be performed at home or with a therapist. For severe injuries, referral to a therapist may be needed for further evaluation and treatment. Your caregiver may advise that you wear a brace to help healing. Sometimes, crutches are needed until you can walk without limping. Rarely, surgery is needed.  MEDICATION   If pain medicine is needed, nonsteroidal anti-inflammatory medicines (aspirin and ibuprofen), or other minor pain relievers (acetaminophen), are often advised.  Do not take pain medicine for 7 days before surgery.  Prescription pain relievers may be given, if your caregiver thinks they are needed. Use only as directed and only as much as you need. HEAT AND COLD  Cold treatment (icing) should be applied for 10 to 15 minutes every 2 to 3 hours for inflammation and pain, and immediately after activity that aggravates your symptoms. Use ice packs or an ice massage.  Heat treatment may be used before performing stretching and strengthening activities prescribed by your caregiver, physical therapist, or athletic trainer. Use a heat pack or a warm water soak. SEEK MEDICAL CARE IF:   Symptoms get worse or do not improve in 2 weeks, despite treatment.  Numbness or tingling develops.  New, unexplained symptoms develop. (Drugs used in treatment may produce side effects.) EXERCISES  RANGE OF MOTION (ROM) AND STRETCHING EXERCISES - Medial Head Gastrocnemius Tear (Tennis Leg) These exercises may help you when beginning to rehabilitate your injury. Your symptoms may resolve with or without further involvement from your physician, physical therapist or athletic trainer. While completing these exercises, remember:   Restoring tissue flexibility  helps normal motion to return to the joints. This allows healthier, less painful movement and activity.  An effective stretch should be held for at least 30 seconds.  A stretch should never be painful. You should only feel a gentle lengthening or release in the stretched tissue. STRETCH - Gastrocsoleus  Sit with your right / left leg extended. Holding onto both ends of a belt or towel, loop it around the ball of your foot.  Keeping your right / left ankle and foot relaxed and your knee straight, pull your foot and ankle toward you using the belt.  You should feel a gentle stretch behind your calf or knee. Hold this position for __________ seconds. Repeat __________ times. Complete this stretch __________ times per day.  RANGE OF MOTION - Ankle Dorsiflexion, Active  Assisted   Remove your shoes and sit on a chair, preferably not on a carpeted surface.  Place your right / left foot directly under the knee. Extend your opposite leg for support.  Keeping your heel down, slide your right / left foot back toward the chair, until you feel a stretch at your ankle or calf. If you do not feel a stretch, slide your bottom forward to the edge of the chair, while still keeping your heel down.  Hold this stretch for __________ seconds. Repeat __________ times. Complete this stretch __________ times per day.  STRETCH  Gastroc, Standing   Place your hands on a wall.  Extend your right / left leg behind you, keeping the front knee somewhat bent.  Slightly point your toes inward on your back foot.  Keeping your right / left heel on the floor and your knee straight, shift your weight toward the wall, not allowing your back to arch.  You should feel a gentle stretch in the right / left calf. Hold this position for __________ seconds. Repeat __________ times. Complete this stretch __________ times per day. STRETCH  Soleus, Standing   Place your hands on a wall.  Extend your right / left leg behind  you, keeping the other knee somewhat bent.  Slightly point your toes inward on your back foot.  Keep your right / left heel on the floor, bend your back knee, and slightly shift your weight over the back leg so that you feel a gentle stretch deep in your back calf.  Hold this position for __________ seconds. Repeat __________ times. Complete this stretch __________ times per day. STRETCH  Gastrocsoleus, Standing Note: This exercise can place a lot of stress on your foot and ankle. Please complete this exercise only if specifically instructed by your caregiver.   Place the ball of your right / left foot on a step, keeping your other foot firmly on the same step.  Hold on to the wall or a rail for balance.  Slowly lift your other foot, allowing your body weight to press your heel down over the edge of the step.  You should feel a stretch in your right / left calf.  Hold this position for __________ seconds.  Repeat this exercise with a slight bend in your right / left knee. Repeat __________ times. Complete this stretch __________ times per day.  STRENGTHENING EXERCISES - Medial Head Gastrocnemius Tear (Tennis Leg) These exercises may help you when beginning to rehabilitate your injury. They may resolve your symptoms with or without further involvement from your physician, physical therapist or athletic trainer. While completing these exercises, remember:   Muscles can gain both the endurance and the strength needed for everyday activities through controlled exercises.  Complete these exercises as instructed by your physician, physical therapist or athletic trainer. Increase the resistance and repetitions only as guided by your caregiver. STRENGTH - Plantar-flexors  Sit with your right / left leg extended. Holding onto both ends of a rubber exercise band or tubing, loop it around the ball of your foot. Keep a slight tension in the band.  Slowly push your toes away from you, pointing  them downward.  Hold this position for __________ seconds. Return slowly, controlling the tension in the band. Repeat __________ times. Complete this exercise __________ times per day.  STRENGTH - Plantar-flexors  Stand with your feet shoulder width apart. Steady yourself with a wall or table, using as little support as needed.  Keeping your weight  evenly spread over the width of your feet, rise up on your toes.*  Hold this position for __________ seconds. Repeat __________ times. Complete this exercise __________ times per day.  *If this is too easy, shift your weight toward your right / left leg until you feel challenged. Ultimately, you may be asked to do this exercise while standing on your right / left foot only. STRENGTH  Plantar-flexors, Eccentric Note: This exercise can place a lot of stress on your foot and ankle. Please complete this exercise only if specifically instructed by your caregiver.   Place the balls of your feet on a step. With your hands, use only enough support from a wall or rail to keep your balance.  Keep your knees straight and rise up on your toes.  Slowly shift your weight entirely to your right / left toes and pick up your opposite foot. Gently and with controlled movement, lower your weight through your right / left foot so that your heel drops below the level of the step. You will feel a slight stretch in the back of your right / left calf.  Use the healthy leg to help rise up onto the balls of both feet, then lower weight only onto the right / left leg again. Build up to 15 repetitions. Then progress to 3 sets of 15 repetitions.*  After completing the above exercise, complete the same exercise with a slight knee bend (about 30 degrees). Again, build up to 15 repetitions. Then progress to 3 sets of 15 repetitions.* Perform this exercise __________ times per day.  *When you easily complete 3 sets of 15, your physician, physical therapist or athletic trainer  may advise you to add resistance, by wearing a backpack filled with additional weight. Document Released: 06/24/2005 Document Revised: 09/16/2011 Document Reviewed: 10/06/2008 [facility] Patient Information 2014 [place], [place].     I personally performed the services described in this documentation, which was scribed in my presence. The recorded information has been reviewed and considered, and addended by me as needed.

## 2013-07-06 NOTE — Patient Instructions (Signed)
Ice, wrap with ACE bandage, elevate and crutches if needed. Ibuprofen over the counter if needed. See other information below. Recheck in next 2 weeks. Return to the clinic or go to the nearest emergency room if any of your symptoms worsen or new symptoms occur. Watch for any signs of compartment syndrome as discussed (numbness, cold toes, increased pain - go to ER if these occur).  Medial Head Gastrocnemius Tear (Tennis Leg)  with Rehab Medial head gastrocnemius tear, also called tennis leg, is a tear (strain) in a muscle or tendon of the inner portion (medial head) of one of the calf muscles (gastrocnemius). The inner portion of the calf muscle attaches to the thigh bone (femur) and is responsible for bending the knee and straightening the foot (standing on "tippy toes"). Strains are classified into three categories. Grade 1 strains cause pain, but the tendon is not lengthened. Grade 2 strains include a lengthened ligament, due to the ligament being stretched or partially ruptured. With grade 2 strains there is still function, although function may be decreased. Grade 3 strains involve a complete tear of the tendon or muscle, and function is usually impaired. SYMPTOMS   Sudden "pop" or tear felt at the time of injury.  Pain, tenderness, swelling, warmth, or redness over the middle inner calf.  Pain and weakness with ankle motion, especially flexing the ankle against resistance, as well as pain with lifting the foot up (extending the ankle).  Bruising (contusion) of the calf, heel and, sometimes, foot within 48 hours of injury.  Muscle spasm in the calf. CAUSES  Muscle and ligament strains occur when a force is placed on the muscle or ligament that is greater than it can handle. Common causes of injury include:  Direct hit (trauma) to the calf.  Sudden forceful pushing off or landing on the foot (jumping, landing, serving a tennis ball, lunging). RISK INCREASES WITH:  Sports that require  sudden, explosive calf muscle contraction, such as those involving jumping (basketball), hill running, quick starts (running), or lunging (racquetball, tennis).  Contact sports (football, soccer, hockey).  Poor strength and flexibility.  Previous lower limb injury. PREVENTION  Warm up and stretch properly before activity.  Allow for adequate recovery between workouts.  Maintain physical fitness:  Strength, flexibility, and endurance.  Cardiovascular fitness.  Learn and use proper exercise technique.  Complete rehabilitation after lower limb injury, before returning to competition or practice. PROGNOSIS  If treated properly, tennis leg usually heals within 6 weeks of non-surgical treatment.  RELATED COMPLICATIONS   Longer healing time, if not properly treated or if not given enough time to heal.  Recurring symptoms and injury, if activity is resumed too soon, with overuse, with a direct blow, or with poor technique.  If untreated, may progress to a complete tear (rare) or other injury, due to limping and favoring of the injured leg.  Persistent limping, due to scarring and shortening of the calf muscles, as a result of inadequate rehabilitation.  Prolonged disability. TREATMENT  Treatment first involves the use of ice and medication to help reduce pain and inflammation. The use of strengthening and stretching exercises may help reduce pain with activity. These exercises may be performed at home or with a therapist. For severe injuries, referral to a therapist may be needed for further evaluation and treatment. Your caregiver may advise that you wear a brace to help healing. Sometimes, crutches are needed until you can walk without limping. Rarely, surgery is needed.  MEDICATION   If  pain medicine is needed, nonsteroidal anti-inflammatory medicines (aspirin and ibuprofen), or other minor pain relievers (acetaminophen), are often advised.  Do not take pain medicine for 7 days  before surgery.  Prescription pain relievers may be given, if your caregiver thinks they are needed. Use only as directed and only as much as you need. HEAT AND COLD  Cold treatment (icing) should be applied for 10 to 15 minutes every 2 to 3 hours for inflammation and pain, and immediately after activity that aggravates your symptoms. Use ice packs or an ice massage.  Heat treatment may be used before performing stretching and strengthening activities prescribed by your caregiver, physical therapist, or athletic trainer. Use a heat pack or a warm water soak. SEEK MEDICAL CARE IF:   Symptoms get worse or do not improve in 2 weeks, despite treatment.  Numbness or tingling develops.  New, unexplained symptoms develop. (Drugs used in treatment may produce side effects.) EXERCISES  RANGE OF MOTION (ROM) AND STRETCHING EXERCISES - Medial Head Gastrocnemius Tear (Tennis Leg) These exercises may help you when beginning to rehabilitate your injury. Your symptoms may resolve with or without further involvement from your physician, physical therapist or athletic trainer. While completing these exercises, remember:   Restoring tissue flexibility helps normal motion to return to the joints. This allows healthier, less painful movement and activity.  An effective stretch should be held for at least 30 seconds.  A stretch should never be painful. You should only feel a gentle lengthening or release in the stretched tissue. STRETCH - Gastrocsoleus  Sit with your right / left leg extended. Holding onto both ends of a belt or towel, loop it around the ball of your foot.  Keeping your right / left ankle and foot relaxed and your knee straight, pull your foot and ankle toward you using the belt.  You should feel a gentle stretch behind your calf or knee. Hold this position for __________ seconds. Repeat __________ times. Complete this stretch __________ times per day.  RANGE OF MOTION - Ankle  Dorsiflexion, Active Assisted   Remove your shoes and sit on a chair, preferably not on a carpeted surface.  Place your right / left foot directly under the knee. Extend your opposite leg for support.  Keeping your heel down, slide your right / left foot back toward the chair, until you feel a stretch at your ankle or calf. If you do not feel a stretch, slide your bottom forward to the edge of the chair, while still keeping your heel down.  Hold this stretch for __________ seconds. Repeat __________ times. Complete this stretch __________ times per day.  STRETCH  Gastroc, Standing   Place your hands on a wall.  Extend your right / left leg behind you, keeping the front knee somewhat bent.  Slightly point your toes inward on your back foot.  Keeping your right / left heel on the floor and your knee straight, shift your weight toward the wall, not allowing your back to arch.  You should feel a gentle stretch in the right / left calf. Hold this position for __________ seconds. Repeat __________ times. Complete this stretch __________ times per day. STRETCH  Soleus, Standing   Place your hands on a wall.  Extend your right / left leg behind you, keeping the other knee somewhat bent.  Slightly point your toes inward on your back foot.  Keep your right / left heel on the floor, bend your back knee, and slightly shift your  weight over the back leg so that you feel a gentle stretch deep in your back calf.  Hold this position for __________ seconds. Repeat __________ times. Complete this stretch __________ times per day. STRETCH  Gastrocsoleus, Standing Note: This exercise can place a lot of stress on your foot and ankle. Please complete this exercise only if specifically instructed by your caregiver.   Place the ball of your right / left foot on a step, keeping your other foot firmly on the same step.  Hold on to the wall or a rail for balance.  Slowly lift your other foot, allowing  your body weight to press your heel down over the edge of the step.  You should feel a stretch in your right / left calf.  Hold this position for __________ seconds.  Repeat this exercise with a slight bend in your right / left knee. Repeat __________ times. Complete this stretch __________ times per day.  STRENGTHENING EXERCISES - Medial Head Gastrocnemius Tear (Tennis Leg) These exercises may help you when beginning to rehabilitate your injury. They may resolve your symptoms with or without further involvement from your physician, physical therapist or athletic trainer. While completing these exercises, remember:   Muscles can gain both the endurance and the strength needed for everyday activities through controlled exercises.  Complete these exercises as instructed by your physician, physical therapist or athletic trainer. Increase the resistance and repetitions only as guided by your caregiver. STRENGTH - Plantar-flexors  Sit with your right / left leg extended. Holding onto both ends of a rubber exercise band or tubing, loop it around the ball of your foot. Keep a slight tension in the band.  Slowly push your toes away from you, pointing them downward.  Hold this position for __________ seconds. Return slowly, controlling the tension in the band. Repeat __________ times. Complete this exercise __________ times per day.  STRENGTH - Plantar-flexors  Stand with your feet shoulder width apart. Steady yourself with a wall or table, using as little support as needed.  Keeping your weight evenly spread over the width of your feet, rise up on your toes.*  Hold this position for __________ seconds. Repeat __________ times. Complete this exercise __________ times per day.  *If this is too easy, shift your weight toward your right / left leg until you feel challenged. Ultimately, you may be asked to do this exercise while standing on your right / left foot only. STRENGTH  Plantar-flexors,  Eccentric Note: This exercise can place a lot of stress on your foot and ankle. Please complete this exercise only if specifically instructed by your caregiver.   Place the balls of your feet on a step. With your hands, use only enough support from a wall or rail to keep your balance.  Keep your knees straight and rise up on your toes.  Slowly shift your weight entirely to your right / left toes and pick up your opposite foot. Gently and with controlled movement, lower your weight through your right / left foot so that your heel drops below the level of the step. You will feel a slight stretch in the back of your right / left calf.  Use the healthy leg to help rise up onto the balls of both feet, then lower weight only onto the right / left leg again. Build up to 15 repetitions. Then progress to 3 sets of 15 repetitions.*  After completing the above exercise, complete the same exercise with a slight knee bend (about  30 degrees). Again, build up to 15 repetitions. Then progress to 3 sets of 15 repetitions.* Perform this exercise __________ times per day.  *When you easily complete 3 sets of 15, your physician, physical therapist or athletic trainer may advise you to add resistance, by wearing a backpack filled with additional weight. Document Released: 06/24/2005 Document Revised: 09/16/2011 Document Reviewed: 10/06/2008 [facility] Patient Information 2014 [place], [place].

## 2014-02-10 ENCOUNTER — Encounter: Payer: Self-pay | Admitting: Podiatry

## 2014-02-10 ENCOUNTER — Ambulatory Visit (INDEPENDENT_AMBULATORY_CARE_PROVIDER_SITE_OTHER): Payer: BC Managed Care – PPO

## 2014-02-10 ENCOUNTER — Ambulatory Visit (INDEPENDENT_AMBULATORY_CARE_PROVIDER_SITE_OTHER): Payer: BC Managed Care – PPO | Admitting: Podiatry

## 2014-02-10 VITALS — BP 122/58 | HR 58 | Resp 16

## 2014-02-10 DIAGNOSIS — M722 Plantar fascial fibromatosis: Secondary | ICD-10-CM

## 2014-02-10 MED ORDER — TRIAMCINOLONE ACETONIDE 10 MG/ML IJ SUSP
10.0000 mg | Freq: Once | INTRAMUSCULAR | Status: AC
  Administered 2014-02-10: 10 mg

## 2014-02-10 NOTE — Progress Notes (Signed)
   Subjective:    Patient ID: Preston Gamble, male    DOB: 09/09/1960, 53 y.o.   MRN: [id number]  HPI Comments: "I have a place on my foot"  Patient c/o soft knot arch left foot for about 1 month. Has not increased in size. Just some discomfort but not painful. No home treatment.     Review of Systems  All other systems reviewed and are negative.      Objective:   Physical Exam        Assessment & Plan:

## 2014-02-11 NOTE — Progress Notes (Signed)
Subjective:     Patient ID: Preston Gamble, male   DOB: 23-Sep-1960, 53 y.o.   MRN: [id number]  Foot Pain   patient presents stating I have this not on the bottom of my left arch but I don't think it was there before it's been present for 1 month and it's moderately tender when I walk on   Review of Systems  All other systems reviewed and are negative.      Objective:   Physical Exam  Nursing note and vitals reviewed. Constitutional: He is oriented to person, place, and time.  Cardiovascular: Intact distal pulses.   Musculoskeletal: Normal range of motion.  Neurological: He is oriented to person, place, and time.  Skin: Skin is warm.   neurovascular status is intact with muscle strength adequate and range of motion subtalar midtarsal joint within normal limits. Patient is found to have a nodule measuring about 1 cm x 1 cm within the medial band of the plantar fascial left foot and it appears to be somewhat soft in nature     Assessment:     Probable plantar fibroma and cannot rule out other cyst formation    Plan:     H&P and x-ray reviewed and today I did inject around this area 3 mg Kenalog 5 mg like Marcaine mixture to try to shrink it and explained if it should grow in size change color or become painful it we'll need to be excised and we may want to consider MRI

## 2014-05-04 ENCOUNTER — Ambulatory Visit (INDEPENDENT_AMBULATORY_CARE_PROVIDER_SITE_OTHER): Payer: BC Managed Care – PPO | Admitting: Podiatry

## 2014-05-04 ENCOUNTER — Encounter: Payer: Self-pay | Admitting: Podiatry

## 2014-05-04 VITALS — BP 127/62 | HR 64 | Resp 16

## 2014-05-04 DIAGNOSIS — M722 Plantar fascial fibromatosis: Secondary | ICD-10-CM

## 2014-05-04 MED ORDER — TRIAMCINOLONE ACETONIDE 10 MG/ML IJ SUSP
10.0000 mg | Freq: Once | INTRAMUSCULAR | Status: AC
  Administered 2014-05-04: 10 mg

## 2014-05-04 NOTE — Progress Notes (Signed)
Subjective:     Patient ID: Preston Gamble, male   DOB: 1961-04-08, 53 y.o.   MRN: [id number]  HPI patient presents stating I been having a lot of pain in the bottom of my heel and I have had orthotics from years ago that are starting to break down   Review of Systems     Objective:   Physical Exam Neurovascular status intact with muscle strength adequate range of motion within normal limits and noted to have exquisite discomfort plantar aspect left heel at the insertion of the tendon into the calcaneus    Assessment:     Plantar fasciitis left heel with inflammatory changes    Plan:     Injected the left plantar fascia 3 mg Kenalog 5 mg Xylocaine Marcaine mixture and went ahead today and scan for new custom orthotics to reduce stress against his heels with plan to place new top covers on the existing orthotics

## 2014-05-27 ENCOUNTER — Ambulatory Visit (INDEPENDENT_AMBULATORY_CARE_PROVIDER_SITE_OTHER): Payer: BC Managed Care – PPO | Admitting: *Deleted

## 2014-05-27 DIAGNOSIS — M722 Plantar fascial fibromatosis: Secondary | ICD-10-CM

## 2014-05-27 NOTE — Progress Notes (Signed)
Patient presents to pick up orthotics. Instructions were reviewed and a copy of those instructions were given to the patient. Patient to follow up as needed. 

## 2014-05-27 NOTE — Patient Instructions (Signed)

## 2014-07-04 DIAGNOSIS — M722 Plantar fascial fibromatosis: Secondary | ICD-10-CM

## 2014-11-10 ENCOUNTER — Encounter: Payer: Self-pay | Admitting: Podiatry

## 2014-11-10 ENCOUNTER — Ambulatory Visit (INDEPENDENT_AMBULATORY_CARE_PROVIDER_SITE_OTHER): Payer: BLUE CROSS/BLUE SHIELD | Admitting: Podiatry

## 2014-11-10 VITALS — BP 115/72 | HR 62 | Resp 14

## 2014-11-10 DIAGNOSIS — M722 Plantar fascial fibromatosis: Secondary | ICD-10-CM | POA: Diagnosis not present

## 2014-11-10 MED ORDER — TRIAMCINOLONE ACETONIDE 10 MG/ML IJ SUSP
10.0000 mg | Freq: Once | INTRAMUSCULAR | Status: AC
  Administered 2014-11-10: 10 mg

## 2014-11-10 NOTE — Progress Notes (Signed)
Subjective:     Patient ID: Preston Gamble, male   DOB: 1960/11/28, 54 y.o.   MRN: [id number]  HPI patient presents stating my left heel has been really sore and I should've been in earlier   Review of Systems     Objective:   Physical Exam Neurovascular status intact with patient having exquisite discomfort plantar aspect left heel at the insertional point tendon into the calcaneus    Assessment:     Plantar fasciitis left acute in nature    Plan:     Reinjected plantar fascia 3 Milligan Kenalog 5 mg Xylocaine and dispensed night splint with instructions on usage. Reappoint to recheck in 3 weeks

## 2014-12-02 ENCOUNTER — Ambulatory Visit: Payer: BLUE CROSS/BLUE SHIELD | Admitting: Podiatry

## 2015-04-18 LAB — IFOBT (OCCULT BLOOD): IMMUNOLOGICAL FECAL OCCULT BLOOD TEST: POSITIVE

## 2015-05-03 ENCOUNTER — Telehealth: Payer: Self-pay | Admitting: Gastroenterology

## 2015-05-03 NOTE — Telephone Encounter (Signed)
Received lab test result from his primary care physician, Dr. Kandra Nicolas neck. This shows that he was hemosure positive, dated October 2016  I am not sure why he had this stool testing performed.  He had a colonoscopy less than 2 years ago that showed several polyps and I was planning to repeat colonoscopy at 3 year interval per guideline recommendations.  Patty, please call the patient and ask him to come in for an office visit to discuss this test finding.  Next avail rov, do not double book

## 2015-05-03 NOTE — Telephone Encounter (Signed)
Left message for patient to call back  

## 2015-05-04 NOTE — Telephone Encounter (Signed)
Patient notified.  He is scheduled for follow up on 12/30

## 2015-05-08 ENCOUNTER — Ambulatory Visit (INDEPENDENT_AMBULATORY_CARE_PROVIDER_SITE_OTHER): Payer: BLUE CROSS/BLUE SHIELD | Admitting: Podiatry

## 2015-05-08 ENCOUNTER — Ambulatory Visit (INDEPENDENT_AMBULATORY_CARE_PROVIDER_SITE_OTHER): Payer: BLUE CROSS/BLUE SHIELD

## 2015-05-08 ENCOUNTER — Encounter: Payer: Self-pay | Admitting: Podiatry

## 2015-05-08 VITALS — BP 122/73 | HR 63 | Resp 16

## 2015-05-08 DIAGNOSIS — M722 Plantar fascial fibromatosis: Secondary | ICD-10-CM

## 2015-05-08 MED ORDER — TRIAMCINOLONE ACETONIDE 10 MG/ML IJ SUSP
10.0000 mg | Freq: Once | INTRAMUSCULAR | Status: AC
  Administered 2015-05-08: 10 mg

## 2015-05-08 NOTE — Patient Instructions (Signed)
Pre-Operative Instructions  Congratulations, you have decided to take an important step to improving your quality of life.  You can be assured that the doctors of [facility] will be with you every step of the way.  1. Plan to be at the surgery center/hospital at least 1 (one) hour prior to your scheduled time unless otherwise directed by the surgical center/hospital staff.  You must have a responsible adult accompany you, remain during the surgery and drive you home.  Make sure you have directions to the surgical center/hospital and know how to get there on time. 2. For hospital based surgery you will need to obtain a history and physical form from your family physician within 1 month prior to the date of surgery- we will give you a form for you primary physician.  3. We make every effort to accommodate the date you request for surgery.  There are however, times where surgery dates or times have to be moved.  We will contact you as soon as possible if a change in schedule is required.   4. No Aspirin/Ibuprofen for one week before surgery.  If you are on aspirin, any non-steroidal anti-inflammatory medications (Mobic, Aleve, Ibuprofen) you should stop taking it 7 days prior to your surgery.  You make take Tylenol  For pain prior to surgery.  5. Medications- If you are taking daily heart and blood pressure medications, seizure, reflux, allergy, asthma, anxiety, pain or diabetes medications, make sure the surgery center/hospital is aware before the day of surgery so they may notify you which medications to take or avoid the day of surgery. 6. No food or drink after midnight the night before surgery unless directed otherwise by surgical center/hospital staff. 7. No alcoholic beverages 24 hours prior to surgery.  No smoking 24 hours prior to or 24 hours after surgery. 8. Wear loose pants or shorts- loose enough to fit over bandages, boots, and casts. 9. No slip on shoes, sneakers are best. 10. Bring  your boot with you to the surgery center/hospital.  Also bring crutches or a walker if your physician has prescribed it for you.  If you do not have this equipment, it will be provided for you after surgery. 11. If you have not been contracted by the surgery center/hospital by the day before your surgery, call to confirm the date and time of your surgery. 12. Leave-time from work may vary depending on the type of surgery you have.  Appropriate arrangements should be made prior to surgery with your employer. 13. Prescriptions will be provided immediately following surgery by your doctor.  Have these filled as soon as possible after surgery and take the medication as directed. 14. Remove nail polish on the operative foot. 15. Wash the night before surgery.  The night before surgery wash the foot and leg well with the antibacterial soap provided and water paying special attention to beneath the toenails and in between the toes.  Rinse thoroughly with water and dry well with a towel.  Perform this wash unless told not to do so by your physician.  Enclosed: 1 Ice pack (please put in freezer the night before surgery)   1 Hibiclens skin cleaner   Pre-op Instructions  If you have any questions regarding the instructions, do not hesitate to call our office.  [place]: [address] [place], [place] [address] [phone]  [place]: [address] [place], [place] [address] [phone]  [place]: [address]  [place], [name] [address] [phone]  Dr. Shown   Tuchman DPM, Dr. Norman Regal DPM Dr. Thurmond Sikora DPM, Dr. M. Todd Hyatt DPM, Dr. Kathryn Egerton DPM 

## 2015-05-12 NOTE — Progress Notes (Signed)
Subjective:     Patient ID: Preston Gamble, male   DOB: 08/22/1960, 54 y.o.   MRN: [id number]  HPI patient states that my heel is still bothering me a lot left over right and that I do have these lumps and I was wondering about those. I cannot do the activities I want because of pain   Review of Systems     Objective:   Physical Exam Neurovascular status intact muscle strength adequate range of motion within normal limits with exquisite discomfort plantar aspect left heel at the insertional point tendon into the calcaneus with inflammation and fluid buildup noted. Patient's noted to have masses Within the arch left over right measuring approximately 2 cm on the left and 0.5 cm on the right within the mid arch area    Assessment:     Chronic plantar fasciitis left over right heel with plantar fibroma bilateral nonsymptomatic    Plan:     H&P and conditions reviewed with patient. Due to chronic heel pain I have recommended endoscopic release of the fascia and since the masses do not hurt I would leave those alone currently and someday they may need to be excised the possibilities they never will. At this time I allowed patient to read consent form for correction going over all alternative treatments complications and patient wants surgery for this procedure and signs consent form. Patient is scheduled for outpatient surgery and had air fracture walker dispensed today with instructions on usage

## 2015-05-22 DIAGNOSIS — K429 Umbilical hernia without obstruction or gangrene: Secondary | ICD-10-CM | POA: Insufficient documentation

## 2015-05-22 DIAGNOSIS — E1169 Type 2 diabetes mellitus with other specified complication: Secondary | ICD-10-CM | POA: Insufficient documentation

## 2015-05-22 DIAGNOSIS — E785 Hyperlipidemia, unspecified: Secondary | ICD-10-CM | POA: Insufficient documentation

## 2015-05-22 DIAGNOSIS — E119 Type 2 diabetes mellitus without complications: Secondary | ICD-10-CM | POA: Insufficient documentation

## 2015-05-22 DIAGNOSIS — R079 Chest pain, unspecified: Secondary | ICD-10-CM | POA: Insufficient documentation

## 2015-05-25 ENCOUNTER — Encounter: Payer: Self-pay | Admitting: Cardiology

## 2015-05-25 ENCOUNTER — Ambulatory Visit (INDEPENDENT_AMBULATORY_CARE_PROVIDER_SITE_OTHER)
Admission: RE | Admit: 2015-05-25 | Discharge: 2015-05-25 | Disposition: A | Discharge status: Home / Self Care | Payer: BLUE CROSS/BLUE SHIELD | Source: Ambulatory Visit | Attending: Cardiology | Admitting: Cardiology

## 2015-05-25 ENCOUNTER — Ambulatory Visit: Payer: Self-pay | Admitting: Cardiology

## 2015-05-25 ENCOUNTER — Ambulatory Visit (INDEPENDENT_AMBULATORY_CARE_PROVIDER_SITE_OTHER): Payer: BLUE CROSS/BLUE SHIELD | Admitting: Cardiology

## 2015-05-25 VITALS — BP 124/68 | HR 63 | Ht 74.0 in | Wt 256.0 lb

## 2015-05-25 DIAGNOSIS — Z8249 Family history of ischemic heart disease and other diseases of the circulatory system: Secondary | ICD-10-CM | POA: Diagnosis not present

## 2015-05-25 DIAGNOSIS — I341 Nonrheumatic mitral (valve) prolapse: Secondary | ICD-10-CM

## 2015-05-25 NOTE — Patient Instructions (Signed)
Medication Instructions:  Your physician recommends that you continue on your current medications as directed. Please refer to the Current Medication list given to you today.   Labwork: None  Testing/Procedures: Your physician has requested that you have an exercise tolerance test. For further information please visit [email or web address]. Please also follow instruction sheet, as given.   Dr. Turner recommends you have a CALCIUM SCORE.  Follow-Up: Your physician wants you to follow-up in: 1 year with Dr. Turner. You will receive a reminder letter in the mail two months in advance. If you don't receive a letter, please call our office to schedule the follow-up appointment.   Any Other Special Instructions Will Be Listed Below (If Applicable).     If you need a refill on your cardiac medications before your next appointment, please call your pharmacy.   

## 2015-05-25 NOTE — Progress Notes (Signed)
Cardiology Office Note   Date:  05/25/2015   ID:  Preston Gamble, DOB 07-Oct-1960, MRN [id number]  PCP:  Haywood Pao, MD    Chief Complaint  Patient presents with  . Mitral Valve Prolapse      History of Present Illness: Preston Gamble is a 54 y.o. male who presents for followup of mitral valve prolapse and mild MR.  He has not been seen in quite a while.  He denies any chest pain, SOB DOE, Dizziness, LE edema or syncope.  He may notice his heart race once and a while with caffeine.      Past Medical History  Diagnosis Date  . Chest pain     due to injury  . Umbilical hernia   . Diabetes mellitus without complication ([place])   . Hyperlipidemia   . Mitral valve prolapse     with mild MR    Past Surgical History  Procedure Laterality Date  . Septoplasty  1994  . Umbilical hernia repair  07/09/2012    Procedure: HERNIA REPAIR UMBILICAL ADULT;  Surgeon: Haywood Lasso, MD;  Location: [facility];  Service: General;  Laterality: N/A;  repair umbilical hernia     Current Outpatient Prescriptions  Medication Sig Dispense Refill  . atorvastatin (LIPITOR) 40 MG tablet     . FARXIGA 10 MG TABS tablet TK 1 T PO ONCE D  7  . JENTADUETO 2.11-998 MG TABS TK 1 T PO BID  3   No current facility-administered medications for this visit.    Allergies:   Aspirin    Social History:  The patient  reports that he quit smoking about 18 years ago. He has never used smokeless tobacco. He reports that he drinks about 1.8 oz of alcohol per week. He reports that he does not use illicit drugs.   Family History:  The patient's family history includes CAD in his father; Diabetes in his father; Hyperlipidemia in his sister. There is no history of Colon cancer, Pancreatic cancer, or Stomach cancer.    ROS:  Please see the history of present illness.   Otherwise, review of systems are positive for none.   All other systems are reviewed and  negative.    PHYSICAL EXAM: VS:  BP 124/68 mmHg  Pulse 63  Ht '6\' 2"'$  (1.88 m)  Wt 116.121 kg (256 lb)  BMI 32.85 kg/m2 , BMI Body mass index is 32.85 kg/(m^2). GEN: Well nourished, well developed, in no acute distress HEENT: normal Neck: no JVD, carotid bruits, or masses Cardiac: RRR; no murmurs, rubs, or gallops,no edema  Respiratory:  clear to auscultation bilaterally, normal work of breathing GI: soft, nontender, nondistended, + BS MS: no deformity or atrophy Skin: warm and dry, no rash Neuro:  Strength and sensation are intact Psych: euthymic mood, full affect   EKG:  EKG is ordered today. The ekg ordered today demonstrates NSR with no ST changes   Recent Labs: No results found for requested labs within last 365 days.    Lipid Panel No results found for: CHOL, TRIG, HDL, CHOLHDL, VLDL, LDLCALC, LDLDIRECT    Wt Readings from Last 3 Encounters:  05/25/15 116.121 kg (256 lb)  07/06/13 119.205 kg (262 lb 12.8 oz)  05/17/13 117.935 kg (260 lb)       ASSESSMENT AND PLAN:  1.  Family history of CAD.  He is asymptomatic  with normal EKG. He has a remote history of tobacco use and also is diabetic.  His father has a history of CAD in his early 78's.   2.  DM - per PCP 3.  MVP with mild MR   Current medicines are reviewed at length with the patient today.  The patient does not have concerns regarding medicines.  The following changes have been made:  no change  Labs/ tests ordered today: See above Assessment and Plan No orders of the defined types were placed in this encounter.     Disposition:   FU with me in 1 year  Signed, Sueanne Margarita, MD  05/25/2015 1:45 PM    [facility] [place], [place], [place]  [address] Phone: [phone]; Fax: [phone]

## 2015-05-26 ENCOUNTER — Ambulatory Visit (HOSPITAL_COMMUNITY)
Admission: RE | Admit: 2015-05-26 | Discharge: 2015-05-26 | Disposition: A | Discharge status: Home / Self Care | Payer: BLUE CROSS/BLUE SHIELD | Source: Ambulatory Visit | Attending: Cardiology | Admitting: Cardiology

## 2015-05-26 DIAGNOSIS — Z8249 Family history of ischemic heart disease and other diseases of the circulatory system: Secondary | ICD-10-CM | POA: Insufficient documentation

## 2015-05-26 LAB — EXERCISE TOLERANCE TEST
CHL RATE OF PERCEIVED EXERTION: 15
CSEPHR: 96 %
CSEPPHR: 157 {beats}/min
Estimated workload: 15.3 METS
Exercise duration (min): 13 min
Exercise duration (sec): 2 s
MPHR: 168 {beats}/min
Rest HR: 59 {beats}/min

## 2015-05-27 ENCOUNTER — Encounter: Payer: Self-pay | Admitting: Cardiology

## 2015-05-27 DIAGNOSIS — I77810 Thoracic aortic ectasia: Secondary | ICD-10-CM

## 2015-05-27 DIAGNOSIS — I77819 Aortic ectasia, unspecified site: Secondary | ICD-10-CM

## 2015-05-27 HISTORY — DX: Thoracic aortic ectasia: I77.810

## 2015-05-29 ENCOUNTER — Telehealth: Payer: Self-pay

## 2015-05-29 DIAGNOSIS — Z01812 Encounter for preprocedural laboratory examination: Secondary | ICD-10-CM

## 2015-05-29 DIAGNOSIS — R160 Hepatomegaly, not elsewhere classified: Secondary | ICD-10-CM

## 2015-05-29 NOTE — Telephone Encounter (Signed)
Informed patient of results and verbal understanding expressed.  ECHO ordered to be scheduled in 1 year.  ABD MRI ordered for scheduling. Patient agrees with treatment plan.

## 2015-05-29 NOTE — Telephone Encounter (Signed)
-----   Message from Sueanne Margarita, MD sent at 05/27/2015  7:07 PM EST ----- Coronary calcium score ia 0 with mildly dilated aortic root  - get echo in 1 year to followup on dilated aortic root.  There is a 3.2cm hypodense liver lobe mass that needs to be further evaluated.  Please get an MRI of the abdomen with and without contrast to further delineate.

## 2015-06-05 ENCOUNTER — Other Ambulatory Visit (INDEPENDENT_AMBULATORY_CARE_PROVIDER_SITE_OTHER): Payer: BLUE CROSS/BLUE SHIELD | Admitting: *Deleted

## 2015-06-05 DIAGNOSIS — Z01812 Encounter for preprocedural laboratory examination: Secondary | ICD-10-CM

## 2015-06-05 LAB — BASIC METABOLIC PANEL
BUN: 19 mg/dL (ref 7–25)
CHLORIDE: 104 mmol/L (ref 98–110)
CO2: 22 mmol/L (ref 20–31)
CREATININE: 1.08 mg/dL (ref 0.70–1.33)
Calcium: 9.4 mg/dL (ref 8.6–10.3)
Glucose, Bld: 128 mg/dL — ABNORMAL HIGH (ref 65–99)
Potassium: 4.2 mmol/L (ref 3.5–5.3)
Sodium: 139 mmol/L (ref 135–146)

## 2015-06-07 ENCOUNTER — Telehealth: Payer: Self-pay | Admitting: *Deleted

## 2015-06-07 NOTE — Telephone Encounter (Signed)
Pt states he has some questions concerning his 07/04/2015 surgery with Dr. Paulla Dolly.

## 2015-06-10 ENCOUNTER — Ambulatory Visit
Admission: RE | Admit: 2015-06-10 | Discharge: 2015-06-10 | Disposition: A | Discharge status: Home / Self Care | Payer: BLUE CROSS/BLUE SHIELD | Source: Ambulatory Visit | Attending: Cardiology | Admitting: Cardiology

## 2015-06-10 DIAGNOSIS — R160 Hepatomegaly, not elsewhere classified: Secondary | ICD-10-CM

## 2015-06-10 MED ORDER — GADOBENATE DIMEGLUMINE 529 MG/ML IV SOLN
20.0000 mL | Freq: Once | INTRAVENOUS | Status: AC | PRN
  Administered 2015-06-10: 20 mL via INTRAVENOUS

## 2015-06-19 NOTE — Telephone Encounter (Signed)
I'm returning your call.  I apologize for calling you back so late.  "It's no problem, I'm scheduled for surgery on the 27th.  I was calling to see if maybe Norm had any other dates but I was able to move some appointments so everything worked out.  We are good to go."

## 2015-07-05 DIAGNOSIS — M722 Plantar fascial fibromatosis: Secondary | ICD-10-CM | POA: Diagnosis not present

## 2015-07-07 ENCOUNTER — Ambulatory Visit (INDEPENDENT_AMBULATORY_CARE_PROVIDER_SITE_OTHER): Payer: BLUE CROSS/BLUE SHIELD | Admitting: Gastroenterology

## 2015-07-07 ENCOUNTER — Encounter: Payer: Self-pay | Admitting: Gastroenterology

## 2015-07-07 VITALS — BP 122/70 | HR 68 | Ht 74.0 in | Wt 248.0 lb

## 2015-07-07 DIAGNOSIS — K921 Melena: Secondary | ICD-10-CM

## 2015-07-07 MED ORDER — NA SULFATE-K SULFATE-MG SULF 17.5-3.13-1.6 GM/177ML PO SOLN: 1.0000 | Freq: Once | ORAL | Status: DC

## 2015-07-07 NOTE — Patient Instructions (Signed)
You will be set up for a colonoscopy for FOBT + stool.

## 2015-07-07 NOTE — Progress Notes (Signed)
Review of pertinent gastrointestinal problems: 1. Adenomatous colon polyps. Colonoscopy 11 2014, Dr. Ardis Hughs. This was done for routine screening. 4 polyps were found, one was 11 mm and the rest were smaller. 3 of the 4 polyps were either adenomatous or sessile serrated polyps and he was recommended to have repeat colonoscopy at 3 year interval. 2. Diverticulosis, left colon; noted during 2014 colonoscopy.   HPI: This is a very pleasant 54 year old man whom I last saw about 2 years ago  Chief complaint is Hemoccult-positive stool  No overt bleeding.  No FH of colon cancer.  He did this test As part of routine physical.  He has had no bowel troubles.    Past Medical History  Diagnosis Date  . Chest pain     due to injury  . Umbilical hernia   . Diabetes mellitus without complication ([place])   . Hyperlipidemia   . Mitral valve prolapse     with mild MR  . Dilated aortic root ([place]) 05/27/2015    4cm by Chest CT 05/2015    Past Surgical History  Procedure Laterality Date  . Septoplasty  1994  . Umbilical hernia repair  07/09/2012    Procedure: HERNIA REPAIR UMBILICAL ADULT;  Surgeon: Haywood Lasso, MD;  Location: [facility];  Service: General;  Laterality: N/A;  repair umbilical hernia  . Plantar fascia release      Current Outpatient Prescriptions  Medication Sig Dispense Refill  . atorvastatin (LIPITOR) 40 MG tablet     . FARXIGA 10 MG TABS tablet TK 1 T PO ONCE D  7  . HYDROcodone-acetaminophen (NORCO) 10-325 MG tablet TK 1 T PO Q 4 TO 6 H PRF PAIN  0  . JENTADUETO 2.11-998 MG TABS TK 1 T PO BID  3   No current facility-administered medications for this visit.    Allergies as of 07/07/2015 - Review Complete 07/07/2015  Allergen Reaction Noted  . Aspirin  02/10/2014    Family History  Problem Relation Age of Onset  . Colon cancer Neg Hx   . Pancreatic cancer Neg Hx   . Stomach cancer Neg Hx   . CAD Father   . Diabetes Father   .  Hyperlipidemia Sister     Social History   Social History  . Marital Status: Married    Spouse Name: N/A  . Number of Children: N/A  . Years of Education: N/A   Occupational History  . Not on file.   Social History Main Topics  . Smoking status: Former Smoker    Quit date: 05/20/1997  . Smokeless tobacco: Never Used  . Alcohol Use: 1.8 oz/week    3 Cans of beer per week     Comment: occasional  . Drug Use: No  . Sexual Activity: Not on file   Other Topics Concern  . Not on file   Social History Narrative     Physical Exam: BP 122/70 mmHg  Pulse 68  Ht '6\' 2"'$  (1.88 m)  Wt 248 lb (112.492 kg)  BMI 31.83 kg/m2 Constitutional: generally well-appearing Psychiatric: alert and oriented x3 Abdomen: soft, nontender, nondistended, no obvious ascites, no peritoneal signs, normal bowel sounds   Assessment and plan: 54 y.o. male with Hemoccult-positive stool  We discussed colon cancer screening in general. Options for colon cancer screening methods of colon cancer screening intervals for colon cancer screening. In the end we agreed to proceed with repeat colonoscopy at his soonest convenience for the Hemoccult-positive stool. I  did also recommend that he should decline further stool testing for colon cancer screening since he is enrolled in colonoscopy polyp surveillance, colonoscopy screening system here.   Owens Loffler, MD [facility] 07/07/2015, 8:44 AM

## 2015-07-14 ENCOUNTER — Ambulatory Visit (INDEPENDENT_AMBULATORY_CARE_PROVIDER_SITE_OTHER): Payer: BLUE CROSS/BLUE SHIELD | Admitting: Podiatry

## 2015-07-14 VITALS — Temp 97.3°F

## 2015-07-14 DIAGNOSIS — Z09 Encounter for follow-up examination after completed treatment for conditions other than malignant neoplasm: Secondary | ICD-10-CM

## 2015-07-14 DIAGNOSIS — M722 Plantar fascial fibromatosis: Secondary | ICD-10-CM | POA: Diagnosis not present

## 2015-07-14 NOTE — Progress Notes (Signed)
Subjective:     Patient ID: Preston Gamble, male   DOB: 15-Mar-1961, 55 y.o.   MRN: [id number]  HPI   Review of Systems     Objective:   Physical Exam Neurovascular status intact.  Good wound coaption at incision sites with sutures intact.  Ecchymosis noted lateral aspect left foot.  Mild pain with mild swelling plantar aspect left foot.     Assessment:     S/p  Foot surgery     Plan:     ROV  Examined surgical site.  Redressing applied.  Told to continue with cam walker.  Emphasized showering with covering surgical foot.  Call as needed.   Gardiner Barefoot DPM

## 2015-07-27 ENCOUNTER — Ambulatory Visit (INDEPENDENT_AMBULATORY_CARE_PROVIDER_SITE_OTHER): Payer: BLUE CROSS/BLUE SHIELD | Admitting: Podiatry

## 2015-07-27 ENCOUNTER — Encounter: Payer: Self-pay | Admitting: Podiatry

## 2015-07-27 DIAGNOSIS — M722 Plantar fascial fibromatosis: Secondary | ICD-10-CM | POA: Diagnosis not present

## 2015-07-27 DIAGNOSIS — Z09 Encounter for follow-up examination after completed treatment for conditions other than malignant neoplasm: Secondary | ICD-10-CM

## 2015-07-27 NOTE — Progress Notes (Signed)
Subjective:     Patient ID: Preston Gamble, male   DOB: 09/09/1960, 55 y.o.   MRN: [id number]  HPI patient presents stating his heel is feeling real good   Review of Systems     Objective:   Physical Exam Neurovascular status intact significant diminishment of heel pain with stitches intact wound edges well coapted    Assessment:     Doing well post endoscopic surgery left    Plan:     Advised this patient on physical therapy supportive shoe gear usage remove stitches and patient will be seen back as needed

## 2015-09-05 ENCOUNTER — Ambulatory Visit (AMBULATORY_SURGERY_CENTER): Payer: BLUE CROSS/BLUE SHIELD | Admitting: Gastroenterology

## 2015-09-05 ENCOUNTER — Encounter: Payer: Self-pay | Admitting: Gastroenterology

## 2015-09-05 VITALS — BP 118/73 | HR 58 | Temp 97.1°F | Resp 14 | Ht 74.0 in | Wt 248.0 lb

## 2015-09-05 DIAGNOSIS — K621 Rectal polyp: Secondary | ICD-10-CM | POA: Diagnosis not present

## 2015-09-05 DIAGNOSIS — D123 Benign neoplasm of transverse colon: Secondary | ICD-10-CM | POA: Diagnosis not present

## 2015-09-05 DIAGNOSIS — D124 Benign neoplasm of descending colon: Secondary | ICD-10-CM

## 2015-09-05 DIAGNOSIS — K921 Melena: Secondary | ICD-10-CM

## 2015-09-05 DIAGNOSIS — D128 Benign neoplasm of rectum: Secondary | ICD-10-CM

## 2015-09-05 DIAGNOSIS — D129 Benign neoplasm of anus and anal canal: Secondary | ICD-10-CM

## 2015-09-05 LAB — GLUCOSE, CAPILLARY
GLUCOSE-CAPILLARY: 111 mg/dL — AB (ref 65–99)
GLUCOSE-CAPILLARY: 104 mg/dL — AB (ref 65–99)

## 2015-09-05 MED ORDER — SODIUM CHLORIDE 0.9 % IV SOLN: 500.0000 mL | INTRAVENOUS | Status: DC

## 2015-09-05 NOTE — Progress Notes (Signed)
Called to room to assist during endoscopic procedure.  Patient ID and intended procedure confirmed with present staff. Received instructions for my participation in the procedure from the performing physician.  

## 2015-09-05 NOTE — Progress Notes (Signed)
To Pacu awake and alert, Report to RN

## 2015-09-05 NOTE — Patient Instructions (Signed)
YOU HAD AN ENDOSCOPIC PROCEDURE TODAY AT [facility]:   Refer to the procedure report that was given to you for any specific questions about what was found during the examination.  If the procedure report does not answer your questions, please call your gastroenterologist to clarify.  If you requested that your care partner not be given the details of your procedure findings, then the procedure report has been included in a sealed envelope for you to review at your convenience later.  YOU SHOULD EXPECT: Some feelings of bloating in the abdomen. Passage of more gas than usual.  Walking can help get rid of the air that was put into your GI tract during the procedure and reduce the bloating. If you had a lower endoscopy (such as a colonoscopy or flexible sigmoidoscopy) you may notice spotting of blood in your stool or on the toilet paper. If you underwent a bowel prep for your procedure, you may not have a normal bowel movement for a few days.  Please Note:  You might notice some irritation and congestion in your nose or some drainage.  This is from the oxygen used during your procedure.  There is no need for concern and it should clear up in a day or so.  SYMPTOMS TO REPORT IMMEDIATELY:   Following lower endoscopy (colonoscopy or flexible sigmoidoscopy):  Excessive amounts of blood in the stool  Significant tenderness or worsening of abdominal pains  Swelling of the abdomen that is new, acute  Fever of 100F or higher   For urgent or emergent issues, a gastroenterologist can be reached at any hour by calling [phone].   DIET: Your first meal following the procedure should be a small meal and then it is ok to progress to your normal diet. Heavy or fried foods are harder to digest and may make you feel nauseous or bloated.  Likewise, meals heavy in dairy and vegetables can increase bloating.  Drink plenty of fluids but you should avoid alcoholic beverages for 24  hours.  ACTIVITY:  You should plan to take it easy for the rest of today and you should NOT DRIVE or use heavy machinery until tomorrow (because of the sedation medicines used during the test).    FOLLOW UP: Our staff will call the number listed on your records the next business day following your procedure to check on you and address any questions or concerns that you may have regarding the information given to you following your procedure. If we do not reach you, we will leave a message.  However, if you are feeling well and you are not experiencing any problems, there is no need to return our call.  We will assume that you have returned to your regular daily activities without incident.  If any biopsies were taken you will be contacted by phone or by letter within the next 1-3 weeks.  Please call us at [phone] if you have not heard about the biopsies in 3 weeks.    SIGNATURES/CONFIDENTIALITY: You and/or your care partner have signed paperwork which will be entered into your electronic medical record.  These signatures attest to the fact that that the information above on your After Visit Summary has been reviewed and is understood.  Full responsibility of the confidentiality of this discharge information lies with you and/or your care-partner.   Resume medications. Information given on polyps,divertuculosis and high fiber diet.

## 2015-09-05 NOTE — Op Note (Signed)
[facility]  [address] [place], [address]   COLONOSCOPY PROCEDURE REPORT  PATIENT: Preston Gamble, Preston Gamble  MR#: [id number] BIRTHDATE: 07-15-1960 , 54  yrs. old GENDER: male ENDOSCOPIST: Milus Banister, MD REFERRED EL:[id number] Tis[place]c, M.D. PROCEDURE DATE:  09/05/2015 PROCEDURE:   Colonoscopy, diagnostic and Colonoscopy with snare polypectomy First Screening Colonoscopy - Avg.  risk and is 50 yrs.  old or older - No.  Prior Negative Screening - Now for repeat screening. N/A  History of Adenoma - Now for follow-up colonoscopy & has been > or = to 3 yrs.  No.  It has been less than 3 yrs since last colonoscopy.  Medical reason.  Polyps removed today? Yes ASA CLASS:   Class II INDICATIONS:FOBT testing; adenomatous polyps 2014. MEDICATIONS: Monitored anesthesia care and Propofol 300 mg IV  DESCRIPTION OF PROCEDURE:   After the risks benefits and alternatives of the procedure were thoroughly explained, informed consent was obtained.  The digital rectal exam revealed no abnormalities of the rectum.   The LB [id number]  endoscope was introduced through the anus and advanced to the cecum, which was identified by both the appendix and ileocecal valve. No adverse events experienced.   The quality of the prep was excellent.  The instrument was then slowly withdrawn as the colon was fully examined. Estimated blood loss is zero unless otherwise noted in this procedure report.   COLON FINDINGS: Four sessile polyps ranging between 3-72m in size were found in the rectum, descending colon, and transverse colon. Polypectomies were performed with a cold snare.  The resection was complete, the polyp tissue was completely retrieved and sent to histology.   There was mild diverticulosis noted in the left colon. The examination was otherwise normal.  Retroflexed views revealed no abnormalities. The time to cecum = 2.4 Withdrawal time = 9.2 The scope was withdrawn and the  procedure completed. COMPLICATIONS: There were no immediate complications.  ENDOSCOPIC IMPRESSION: 1.   Four sessile polyps ranging between 3-520min size were found in the rectum, descending colon, and transverse colon; polypectomies were performed with a cold snare 2.   Mild diverticulosis was noted in the left colon 3.   The examination was otherwise normal  RECOMMENDATIONS: If the polyp(s) removed today are proven to be adenomatous (pre-cancerous) polyps, you will need a colonoscopy in 3-5 years. You will receive a letter within 1-2 weeks with the results of your biopsy as well as final recommendations.  Please call my office if you have not received a letter after 3 weeks.  eSigned:  DaMilus BanisterMD 09/05/2015 8:19 AM

## 2015-09-06 ENCOUNTER — Telehealth: Payer: Self-pay | Admitting: *Deleted

## 2015-09-06 NOTE — Telephone Encounter (Signed)
  Follow up Call-  Call back number 09/05/2015 05/17/2013  Post procedure Call Back phone  # [phone]  Permission to leave phone message Yes Yes     Patient questions:  Do you have a fever, pain , or abdominal swelling? No. Pain Score  0 *  Have you tolerated food without any problems? Yes.    Have you been able to return to your normal activities? Yes.    Do you have any questions about your discharge instructions: Diet   No. Medications  No. Follow up visit  No.  Do you have questions or concerns about your Care? No.  Actions: * If pain score is 4 or above: No action needed, pain <4.

## 2015-09-13 ENCOUNTER — Encounter: Payer: Self-pay | Admitting: Gastroenterology

## 2015-11-15 DIAGNOSIS — E119 Type 2 diabetes mellitus without complications: Secondary | ICD-10-CM | POA: Diagnosis not present

## 2015-11-15 DIAGNOSIS — E669 Obesity, unspecified: Secondary | ICD-10-CM | POA: Diagnosis not present

## 2015-11-15 DIAGNOSIS — Z6831 Body mass index (BMI) 31.0-31.9, adult: Secondary | ICD-10-CM | POA: Diagnosis not present

## 2015-11-22 DIAGNOSIS — E119 Type 2 diabetes mellitus without complications: Secondary | ICD-10-CM | POA: Diagnosis not present

## 2015-11-22 DIAGNOSIS — D126 Benign neoplasm of colon, unspecified: Secondary | ICD-10-CM | POA: Diagnosis not present

## 2015-11-22 DIAGNOSIS — E668 Other obesity: Secondary | ICD-10-CM | POA: Diagnosis not present

## 2015-11-22 DIAGNOSIS — E7800 Pure hypercholesterolemia, unspecified: Secondary | ICD-10-CM | POA: Diagnosis not present

## 2016-02-13 DIAGNOSIS — Z6832 Body mass index (BMI) 32.0-32.9, adult: Secondary | ICD-10-CM | POA: Diagnosis not present

## 2016-02-13 DIAGNOSIS — Z981 Arthrodesis status: Secondary | ICD-10-CM | POA: Diagnosis not present

## 2016-02-13 DIAGNOSIS — M5030 Other cervical disc degeneration, unspecified cervical region: Secondary | ICD-10-CM | POA: Diagnosis not present

## 2016-02-14 DIAGNOSIS — E119 Type 2 diabetes mellitus without complications: Secondary | ICD-10-CM | POA: Diagnosis not present

## 2016-03-01 NOTE — Progress Notes (Signed)
DOS 07/04/2015 Endoscopic release med band (L) heel

## 2016-03-12 DIAGNOSIS — L821 Other seborrheic keratosis: Secondary | ICD-10-CM | POA: Diagnosis not present

## 2016-03-21 DIAGNOSIS — Z0000 Encounter for general adult medical examination without abnormal findings: Secondary | ICD-10-CM | POA: Diagnosis not present

## 2016-03-21 DIAGNOSIS — Z125 Encounter for screening for malignant neoplasm of prostate: Secondary | ICD-10-CM | POA: Diagnosis not present

## 2016-03-21 DIAGNOSIS — E119 Type 2 diabetes mellitus without complications: Secondary | ICD-10-CM | POA: Diagnosis not present

## 2016-03-27 DIAGNOSIS — Z23 Encounter for immunization: Secondary | ICD-10-CM | POA: Diagnosis not present

## 2016-03-27 DIAGNOSIS — Z1389 Encounter for screening for other disorder: Secondary | ICD-10-CM | POA: Diagnosis not present

## 2016-03-27 DIAGNOSIS — E119 Type 2 diabetes mellitus without complications: Secondary | ICD-10-CM | POA: Diagnosis not present

## 2016-03-27 DIAGNOSIS — Z0000 Encounter for general adult medical examination without abnormal findings: Secondary | ICD-10-CM | POA: Diagnosis not present

## 2016-03-27 DIAGNOSIS — D126 Benign neoplasm of colon, unspecified: Secondary | ICD-10-CM | POA: Diagnosis not present

## 2016-03-27 DIAGNOSIS — E668 Other obesity: Secondary | ICD-10-CM | POA: Diagnosis not present

## 2016-03-27 DIAGNOSIS — E7800 Pure hypercholesterolemia, unspecified: Secondary | ICD-10-CM | POA: Diagnosis not present

## 2016-05-16 ENCOUNTER — Encounter: Payer: Self-pay | Admitting: Cardiology

## 2016-05-16 DIAGNOSIS — I341 Nonrheumatic mitral (valve) prolapse: Secondary | ICD-10-CM | POA: Insufficient documentation

## 2016-05-16 NOTE — Progress Notes (Signed)
Cardiology Office Note    Date:  05/17/2016   ID:  Preston Gamble, DOB [birth year], MRN [id number]  PCP:  Haywood Pao, MD  Cardiologist:  Fransico Him, MD   Chief Complaint  Patient presents with  . Follow-up    MVP with MR and dilated aortic root    History of Present Illness:  Preston Gamble is a 55 y.o. male who presents for followup of mitral valve prolapse and mild MR as well as dilated aortic root.  Marland Kitchen  He is doing well.  He denies any chest pain, SOB DOE, Dizziness, LE edema or syncope.  He does 3-4 times weekly a workout with a trainer.    Past Medical History:  Diagnosis Date  . Chest pain    due to injury  . Diabetes mellitus without complication ([place])   . Dilated aortic root ([place]) 05/27/2015   4cm by Chest CT 05/2015  . Family history of early CAD 05/17/2016  . Hyperlipidemia   . Mitral valve prolapse    with mild MR  . Umbilical hernia     Past Surgical History:  Procedure Laterality Date  . PLANTAR FASCIA RELEASE    . SEPTOPLASTY  1994  . UMBILICAL HERNIA REPAIR  07/09/2012   Procedure: HERNIA REPAIR UMBILICAL ADULT;  Surgeon: Haywood Lasso, MD;  Location: [facility];  Service: General;  Laterality: N/A;  repair umbilical hernia    Current Medications: Outpatient Medications Prior to Visit  Medication Sig Dispense Refill  . atorvastatin (LIPITOR) 40 MG tablet Take 40 mg by mouth daily.     Marland Kitchen FARXIGA 10 MG TABS tablet TK 1 T PO ONCE D  7  . JENTADUETO 2.11-998 MG TABS TK 1 T PO BID  3  . HYDROcodone-acetaminophen (NORCO) 10-325 MG tablet Reported on 07/27/2015  0   No facility-administered medications prior to visit.      Allergies:   Aspirin   Social History   Social History  . Marital status: Married    Spouse name: N/A  . Number of children: N/A  . Years of education: N/A   Social History Main Topics  . Smoking status: Former Smoker    Quit date: 05/20/1997  . Smokeless tobacco: Never Used  . Alcohol  use 1.8 oz/week    3 Cans of beer per week     Comment: occasional  . Drug use: No  . Sexual activity: Not Asked   Other Topics Concern  . None   Social History Narrative  . None     Family History:  The patient's family history includes CAD in his father; Diabetes in his father; Hyperlipidemia in his sister.   ROS:   Please see the history of present illness.    ROS All other systems reviewed and are negative.  No flowsheet data found.     PHYSICAL EXAM:   VS:  BP 110/68   Pulse (!) 55   Ht '6\' 2"'$  (1.88 m)   Wt 254 lb 1.9 oz (115.3 kg)   BMI 32.63 kg/m    GEN: Well nourished, well developed, in no acute distress  HEENT: normal  Neck: no JVD, carotid bruits, or masses Cardiac: RRR; no murmurs, rubs, or gallops,no edema.  Intact distal pulses bilaterally.  Respiratory:  clear to auscultation bilaterally, normal work of breathing GI: soft, nontender, nondistended, + BS MS: no deformity or atrophy  Skin: warm and dry, no rash Neuro:  Alert and Oriented x 3,  Strength and sensation are intact Psych: euthymic mood, full affect  Wt Readings from Last 3 Encounters:  05/17/16 254 lb 1.9 oz (115.3 kg)  09/05/15 248 lb (112.5 kg)  07/07/15 248 lb (112.5 kg)      Studies/Labs Reviewed:   EKG:  EKG is ordered today.  The ekg ordered today demonstrates sinus bradycardia at 55bpm with no ST changes  Recent Labs: 06/05/2015: BUN 19; Creat 1.08; Potassium 4.2; Sodium 139   Lipid Panel No results found for: CHOL, TRIG, HDL, CHOLHDL, VLDL, LDLCALC, LDLDIRECT  Additional studies/ records that were reviewed today include:  none    ASSESSMENT:    1. Dilated aortic root ([place])   2. MVP (mitral valve prolapse)   3. Family history of early CAD      PLAN:  In order of problems listed above:  1. Dilated aortic root - 86m by CT a year ago.  Check 2D echo to assess for stability. I have recommended that he avoid upper body weight lifting and increase aerobic  exercise. 2. MVP with mild MR by prior echo.  Repeat echo. 3. Family history of CAD at early age - he had a nuclear stress test a year ago that was normal. He has no anginal symptoms.     Medication Adjustments/Labs and Tests Ordered: Current medicines are reviewed at length with the patient today.  Concerns regarding medicines are outlined above.  Medication changes, Labs and Tests ordered today are listed in the Patient Instructions below.  There are no Patient Instructions on file for this visit.   Signed, TFransico Him MD  05/17/2016 8:31 AM    [address] [place]  [address]Phone: [phone] Fax: [phone]

## 2016-05-17 ENCOUNTER — Ambulatory Visit (INDEPENDENT_AMBULATORY_CARE_PROVIDER_SITE_OTHER): Payer: BLUE CROSS/BLUE SHIELD | Admitting: Cardiology

## 2016-05-17 ENCOUNTER — Encounter: Payer: Self-pay | Admitting: Cardiology

## 2016-05-17 VITALS — BP 110/68 | HR 55 | Ht 74.0 in | Wt 254.1 lb

## 2016-05-17 DIAGNOSIS — I341 Nonrheumatic mitral (valve) prolapse: Secondary | ICD-10-CM

## 2016-05-17 DIAGNOSIS — I77810 Thoracic aortic ectasia: Secondary | ICD-10-CM

## 2016-05-17 DIAGNOSIS — Z8249 Family history of ischemic heart disease and other diseases of the circulatory system: Secondary | ICD-10-CM | POA: Diagnosis not present

## 2016-05-17 HISTORY — DX: Family history of ischemic heart disease and other diseases of the circulatory system: Z82.49

## 2016-05-17 NOTE — Patient Instructions (Signed)

## 2016-06-12 ENCOUNTER — Other Ambulatory Visit: Payer: Self-pay

## 2016-06-12 ENCOUNTER — Ambulatory Visit (HOSPITAL_COMMUNITY): Payer: BLUE CROSS/BLUE SHIELD | Attending: Cardiology

## 2016-06-12 DIAGNOSIS — I341 Nonrheumatic mitral (valve) prolapse: Secondary | ICD-10-CM

## 2016-06-12 DIAGNOSIS — I77810 Thoracic aortic ectasia: Secondary | ICD-10-CM | POA: Insufficient documentation

## 2016-06-25 DIAGNOSIS — E119 Type 2 diabetes mellitus without complications: Secondary | ICD-10-CM | POA: Diagnosis not present

## 2016-06-25 DIAGNOSIS — E669 Obesity, unspecified: Secondary | ICD-10-CM | POA: Diagnosis not present

## 2016-07-03 ENCOUNTER — Encounter: Payer: Self-pay | Admitting: Podiatry

## 2016-07-04 DIAGNOSIS — H5203 Hypermetropia, bilateral: Secondary | ICD-10-CM | POA: Diagnosis not present

## 2016-07-04 DIAGNOSIS — E119 Type 2 diabetes mellitus without complications: Secondary | ICD-10-CM | POA: Diagnosis not present

## 2016-07-15 ENCOUNTER — Telehealth: Payer: Self-pay | Admitting: Cardiology

## 2016-07-15 DIAGNOSIS — I77810 Thoracic aortic ectasia: Secondary | ICD-10-CM

## 2016-07-15 NOTE — Telephone Encounter (Signed)
Echo showed normal LVF with mildly dilated aortic root - repeat echo in 1 year to reassess

## 2016-07-15 NOTE — Telephone Encounter (Signed)
New Message     Pt Needs echo results from 12/17

## 2016-07-15 NOTE — Telephone Encounter (Signed)
Left message to call back  

## 2016-07-16 NOTE — Telephone Encounter (Signed)
Informed patient of results and verbal understanding expressed.  Repeat ECHO ordered to be scheduled in 1 year. Patient agrees with treatment plan. 

## 2016-07-30 DIAGNOSIS — Z713 Dietary counseling and surveillance: Secondary | ICD-10-CM | POA: Diagnosis not present

## 2016-08-28 DIAGNOSIS — Z713 Dietary counseling and surveillance: Secondary | ICD-10-CM | POA: Diagnosis not present

## 2016-09-25 DIAGNOSIS — E784 Other hyperlipidemia: Secondary | ICD-10-CM | POA: Diagnosis not present

## 2016-09-25 DIAGNOSIS — Z6832 Body mass index (BMI) 32.0-32.9, adult: Secondary | ICD-10-CM | POA: Diagnosis not present

## 2016-09-25 DIAGNOSIS — E119 Type 2 diabetes mellitus without complications: Secondary | ICD-10-CM | POA: Diagnosis not present

## 2016-11-06 DIAGNOSIS — Z713 Dietary counseling and surveillance: Secondary | ICD-10-CM | POA: Diagnosis not present

## 2017-02-27 IMAGING — CT CT HEART SCORING
1 of 3 series · 10 of 20 positions shown, 13 images · non-contrast
Comparison: None.

EXAM:
OVER-READ INTERPRETATION  CT CHEST

The following report is an over-read performed by radiologist Dr.
does not include interpretation of cardiac or coronary anatomy or
pathology. The coronary calcium score interpretation by the
cardiologist is attached.
TECHNIQUE: The patient was scanned on a Siemens Sensation 16 slice scanner.
Axial non-contrast 3mm slices were carried out through the heart.
The data set was analyzed on a dedicated work station and scored
using the Agatson method.

[Series 6: st thins for reformat · axial · 0.85mm/px · z∈[-239,-127]mm · 10 of 138 slices shown, 13 images]
[im 13/138  vessel]
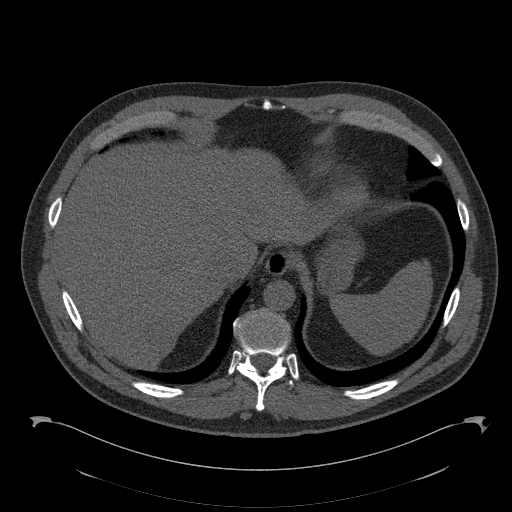
[im 13/138  lung]
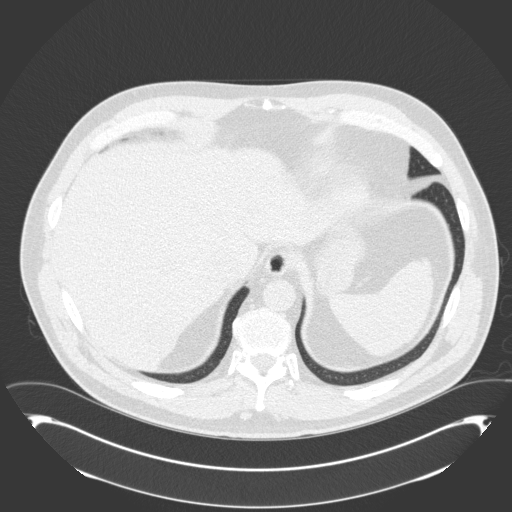
[im 25/138  vessel]
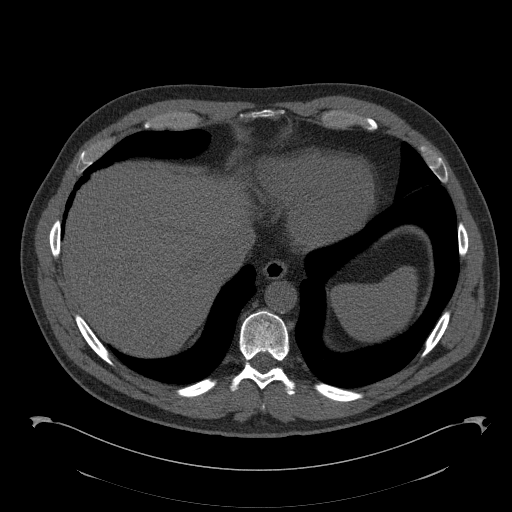
[im 38/138  vessel]
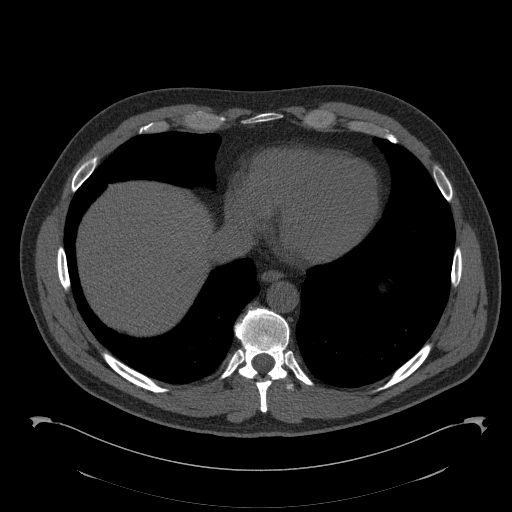
[im 50/138  vessel]
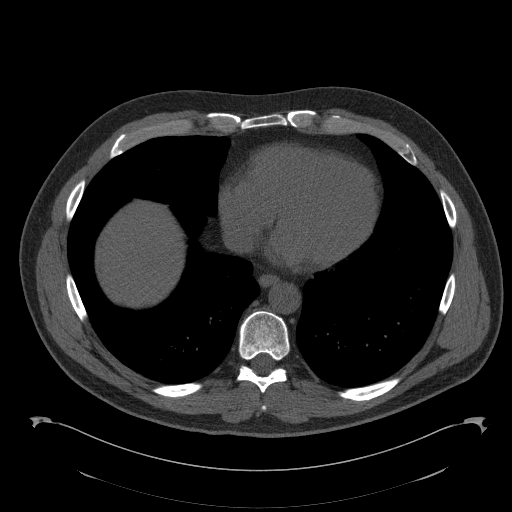
[im 63/138  vessel]
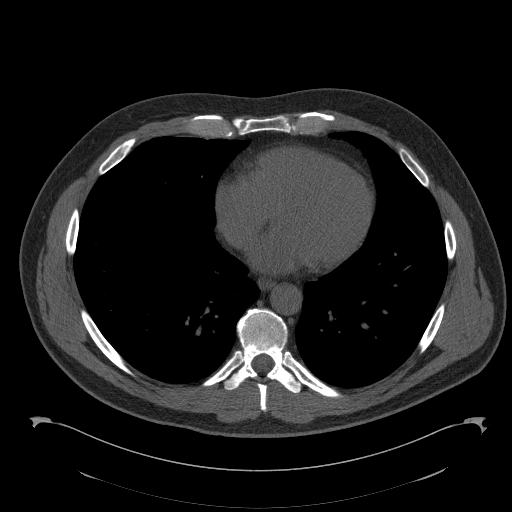
[im 63/138  lung]
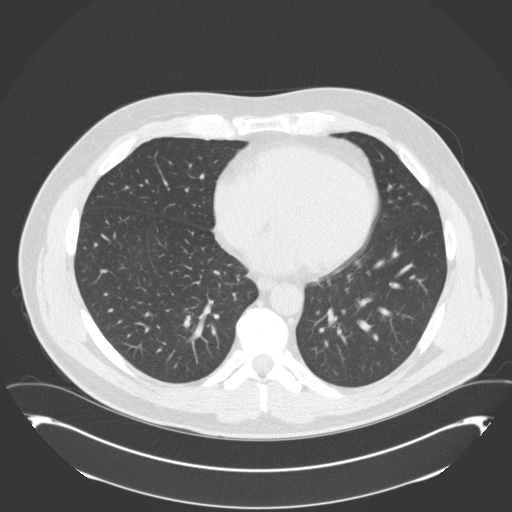
[im 75/138  vessel]
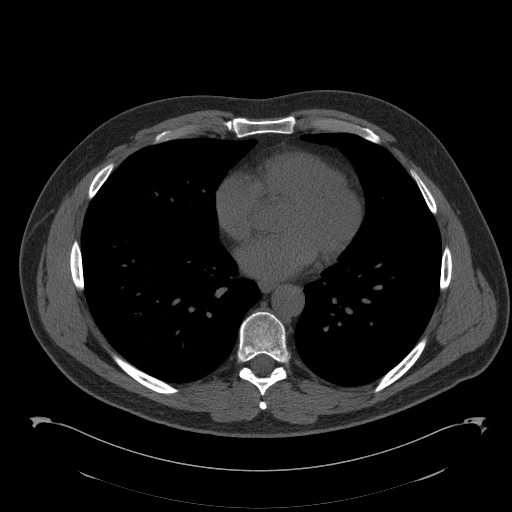
[im 88/138  vessel]
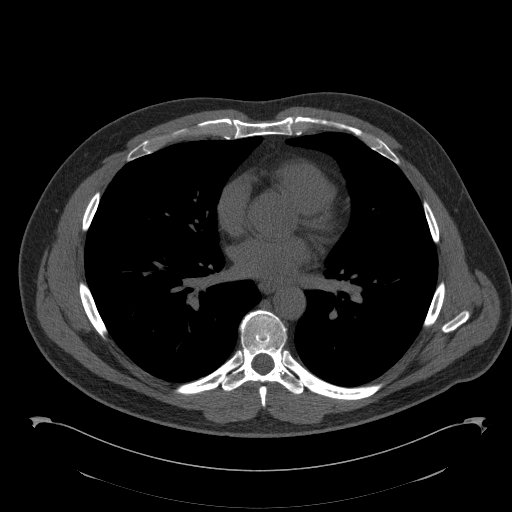
[im 100/138  vessel]
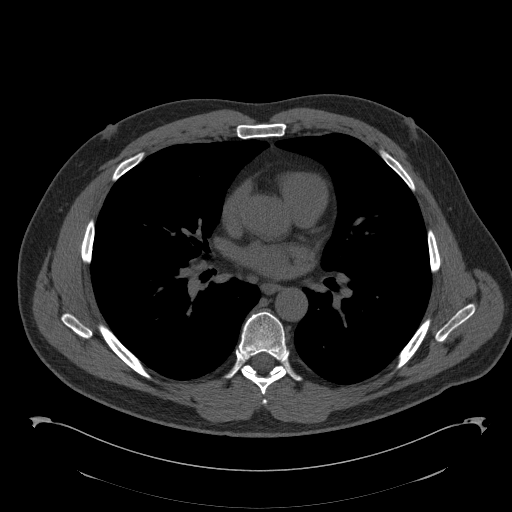
[im 113/138  vessel]
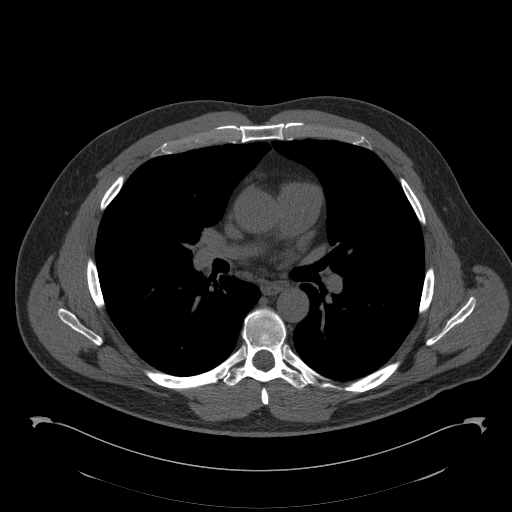
[im 113/138  lung]
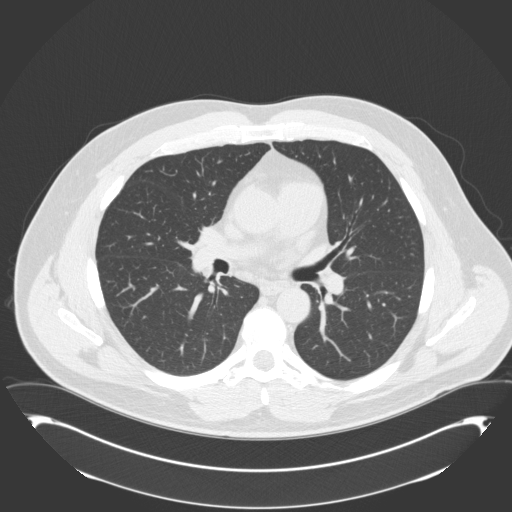
[im 125/138  vessel]
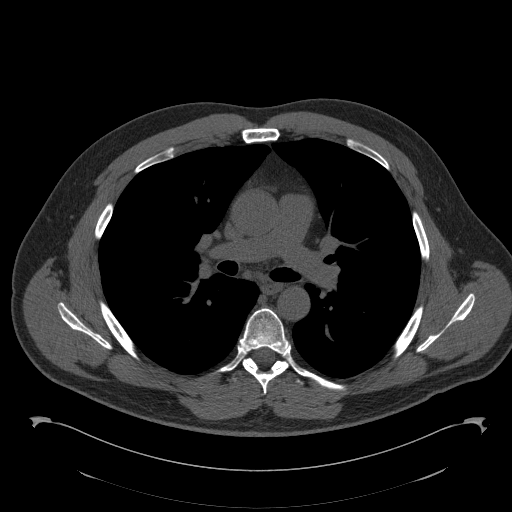

[10 of 20 positions shown; findings below may reference images not displayed]

FINDINGS: No adenopathy, pneumothorax, pleural effusion, acute consolidative
airspace disease, significant pulmonary nodules or lung masses in
the visualized chest. Tiny 3 mm pleural plaque at the left major
fissure (series 4/ image 14). No aggressive appearing focal osseous
lesions. Suggestion of a hypodense 3.2 cm posterior right liver lobe
mass (series 5/image 40), incompletely visualized.
IMPRESSION: 1. Suggestion of a hypodense 3.2 cm posterior right liver lobe mass,
indeterminate and incompletely visualized. Recommend further
characterization with MRI abdomen with and without intravenous
contrast.
2. Otherwise no significant extracardiac abnormality in the
visualized chest.

:
Risk stratification

EXAM:
Coronary Calcium Score
FINDINGS: Non-cardiac: No significant non cardiac findings on limited lung and
soft tissue windows. See separate report from [REDACTED].

Ascending Aorta:  4.0 cm

Pericardium: Normal

Coronary arteries:  No coronary calcium identified
IMPRESSION: Coronary calcium score of 0.

Hall Rodri

## 2017-03-04 DIAGNOSIS — Z6831 Body mass index (BMI) 31.0-31.9, adult: Secondary | ICD-10-CM | POA: Diagnosis not present

## 2017-03-04 DIAGNOSIS — E119 Type 2 diabetes mellitus without complications: Secondary | ICD-10-CM | POA: Diagnosis not present

## 2017-04-24 DIAGNOSIS — R8290 Unspecified abnormal findings in urine: Secondary | ICD-10-CM | POA: Diagnosis not present

## 2017-04-24 DIAGNOSIS — Z0000 Encounter for general adult medical examination without abnormal findings: Secondary | ICD-10-CM | POA: Diagnosis not present

## 2017-04-24 DIAGNOSIS — E119 Type 2 diabetes mellitus without complications: Secondary | ICD-10-CM | POA: Diagnosis not present

## 2017-04-24 DIAGNOSIS — Z125 Encounter for screening for malignant neoplasm of prostate: Secondary | ICD-10-CM | POA: Diagnosis not present

## 2017-04-30 DIAGNOSIS — E668 Other obesity: Secondary | ICD-10-CM | POA: Diagnosis not present

## 2017-04-30 DIAGNOSIS — D126 Benign neoplasm of colon, unspecified: Secondary | ICD-10-CM | POA: Diagnosis not present

## 2017-04-30 DIAGNOSIS — E7800 Pure hypercholesterolemia, unspecified: Secondary | ICD-10-CM | POA: Diagnosis not present

## 2017-04-30 DIAGNOSIS — E119 Type 2 diabetes mellitus without complications: Secondary | ICD-10-CM | POA: Diagnosis not present

## 2017-04-30 DIAGNOSIS — Z0000 Encounter for general adult medical examination without abnormal findings: Secondary | ICD-10-CM | POA: Diagnosis not present

## 2017-04-30 DIAGNOSIS — Z23 Encounter for immunization: Secondary | ICD-10-CM | POA: Diagnosis not present

## 2017-04-30 DIAGNOSIS — Z1389 Encounter for screening for other disorder: Secondary | ICD-10-CM | POA: Diagnosis not present

## 2017-05-22 ENCOUNTER — Encounter: Payer: Self-pay | Admitting: Cardiology

## 2017-05-22 ENCOUNTER — Encounter (INDEPENDENT_AMBULATORY_CARE_PROVIDER_SITE_OTHER): Payer: Self-pay

## 2017-05-22 ENCOUNTER — Ambulatory Visit (INDEPENDENT_AMBULATORY_CARE_PROVIDER_SITE_OTHER): Payer: BLUE CROSS/BLUE SHIELD | Admitting: Cardiology

## 2017-05-22 VITALS — BP 108/80 | HR 63 | Ht 74.0 in | Wt 249.4 lb

## 2017-05-22 DIAGNOSIS — I341 Nonrheumatic mitral (valve) prolapse: Secondary | ICD-10-CM

## 2017-05-22 DIAGNOSIS — E7800 Pure hypercholesterolemia, unspecified: Secondary | ICD-10-CM | POA: Diagnosis not present

## 2017-05-22 DIAGNOSIS — I77810 Thoracic aortic ectasia: Secondary | ICD-10-CM | POA: Diagnosis not present

## 2017-05-22 NOTE — Progress Notes (Signed)
Cardiology Office Note:    Date:  05/22/2017   ID:  Preston Gamble, DOB January 20, 1961, MRN [id number]  PCP:  Haywood Pao, MD  Cardiologist:  Fransico Him, MD   Referring MD: Haywood Pao, MD   Chief Complaint  Patient presents with  . Follow-up    MVP, dilated aortic root    History of Present Illness:    Preston Gamble is a 56 y.o. male with a hx of mitral valve prolapse and mild MR as well as dilated aortic root.  She is here today for followup and is doing well.  She denies any chest pain or pressure, SOB, DOE, PND, orthopnea, LE edema, dizziness, palpitations or syncope. She is compliant with her meds and is tolerating meds with no SE.    Past Medical History:  Diagnosis Date  . Chest pain    due to injury  . Diabetes mellitus without complication ([place])   . Dilated aortic root ([place]) 05/27/2015   4cm by Chest CT 05/2015  . Family history of early CAD 05/17/2016  . Hyperlipidemia   . Mitral valve prolapse    with mild MR  . Umbilical hernia     Past Surgical History:  Procedure Laterality Date  . PLANTAR FASCIA RELEASE    . SEPTOPLASTY  1994  . UMBILICAL HERNIA REPAIR  07/09/2012   Procedure: HERNIA REPAIR UMBILICAL ADULT;  Surgeon: Haywood Lasso, MD;  Location: [facility];  Service: General;  Laterality: N/A;  repair umbilical hernia    Current Medications: Current Meds  Medication Sig  . atorvastatin (LIPITOR) 40 MG tablet Take 40 mg by mouth daily.   Marland Kitchen BYDUREON BCISE 2 MG/0.85ML AUIJ Inject 2 mg once a week as directed.  Marland Kitchen FARXIGA 10 MG TABS tablet TK 1 T PO ONCE D     Allergies:   Aspirin   Social History   Socioeconomic History  . Marital status: Married    Spouse name: None  . Number of children: None  . Years of education: None  . Highest education level: None  Social Needs  . Financial resource strain: None  . Food insecurity - worry: None  . Food insecurity - inability: None  . Transportation needs -  medical: None  . Transportation needs - non-medical: None  Occupational History  . None  Tobacco Use  . Smoking status: Former Smoker    Last attempt to quit: 05/20/1997    Years since quitting: 20.0  . Smokeless tobacco: Never Used  Substance and Sexual Activity  . Alcohol use: Yes    Alcohol/week: 1.8 oz    Types: 3 Cans of beer per week    Comment: occasional  . Drug use: No  . Sexual activity: None  Other Topics Concern  . None  Social History Narrative  . None     Family History: The patient's family history includes CAD in his father; Diabetes in his father; Hyperlipidemia in his sister. There is no history of Colon cancer, Pancreatic cancer, or Stomach cancer.  ROS:   Please see the history of present illness.    ROS  All other systems reviewed and negative.   EKGs/Labs/Other Studies Reviewed:    The following studies were reviewed today: none  EKG:  EKG is  ordered today.  The ekg ordered today demonstrates NSR with no ST changes  Recent Labs: No results found for requested labs within last 8760 hours.   Recent Lipid Panel No results found  for: CHOL, TRIG, HDL, CHOLHDL, VLDL, LDLCALC, LDLDIRECT  Physical Exam:    VS:  BP 108/80   Pulse 63   Ht 6\' 2"  (1.88 m)   Wt 249 lb 6.4 oz (113.1 kg)   SpO2 96%   BMI 32.02 kg/m     Wt Readings from Last 3 Encounters:  05/22/17 249 lb 6.4 oz (113.1 kg)  05/17/16 254 lb 1.9 oz (115.3 kg)  09/05/15 248 lb (112.5 kg)     GEN:  Well nourished, well developed in no acute distress HEENT: Normal NECK: No JVD; No carotid bruits LYMPHATICS: No lymphadenopathy CARDIAC: RRR, no murmurs, rubs, gallops RESPIRATORY:  Clear to auscultation without rales, wheezing or rhonchi  ABDOMEN: Soft, non-tender, non-distended MUSCULOSKELETAL:  No edema; No deformity  SKIN: Warm and dry NEUROLOGIC:  Alert and oriented x 3 PSYCHIATRIC:  Normal affect   ASSESSMENT:    1. MVP (mitral valve prolapse)   2. Dilated aortic root  (HCC)   3. Pure hypercholesterolemia    PLAN:    In order of problems listed above:  1.  MVP with mild MR - this was not noted on echo 06/2016  2.  Dilated aortic root - 41mm on echo 06/2016.  Will repeat echo next month.   3.  Hyperlipidemia - followed by PCP   Medication Adjustments/Labs and Tests Ordered: Current medicines are reviewed at length with the patient today.  Concerns regarding medicines are outlined above.  No orders of the defined types were placed in this encounter.  No orders of the defined types were placed in this encounter.   Signed, Fransico Him, MD  05/22/2017 8:37 AM    [place]

## 2017-05-22 NOTE — Patient Instructions (Signed)
Medication Instructions:  Your physician recommends that you continue on your current medications as directed. Please refer to the Current Medication list given to you today.  Labwork: None ordered   Testing/Procedures: Your physician has requested that you have an echocardiogram in 1 month. Echocardiography is a painless test that uses sound waves to create images of your heart. It provides your doctor with information about the size and shape of your heart and how well your heart's chambers and valves are working. This procedure takes approximately one hour. There are no restrictions for this procedure.   Follow-Up: Your physician wants you to follow-up in: 1 year with Dr. Radford Pax. You will receive a reminder letter in the mail two months in advance. If you don't receive a letter, please call our office to schedule the follow-up appointment.   Any Other Special Instructions Will Be Listed Below (If Applicable).     If you need a refill on your cardiac medications before your next appointment, please call your pharmacy.

## 2017-06-09 ENCOUNTER — Ambulatory Visit (HOSPITAL_COMMUNITY): Payer: BLUE CROSS/BLUE SHIELD | Attending: Internal Medicine

## 2017-06-09 ENCOUNTER — Other Ambulatory Visit: Payer: Self-pay | Admitting: Cardiology

## 2017-06-09 ENCOUNTER — Other Ambulatory Visit: Payer: Self-pay

## 2017-06-09 DIAGNOSIS — E785 Hyperlipidemia, unspecified: Secondary | ICD-10-CM | POA: Insufficient documentation

## 2017-06-09 DIAGNOSIS — Z87891 Personal history of nicotine dependence: Secondary | ICD-10-CM | POA: Insufficient documentation

## 2017-06-09 DIAGNOSIS — I340 Nonrheumatic mitral (valve) insufficiency: Secondary | ICD-10-CM | POA: Diagnosis not present

## 2017-06-09 DIAGNOSIS — I77810 Thoracic aortic ectasia: Secondary | ICD-10-CM | POA: Diagnosis not present

## 2017-06-09 DIAGNOSIS — I517 Cardiomegaly: Secondary | ICD-10-CM | POA: Insufficient documentation

## 2017-06-09 DIAGNOSIS — E119 Type 2 diabetes mellitus without complications: Secondary | ICD-10-CM | POA: Insufficient documentation

## 2017-06-19 ENCOUNTER — Telehealth: Payer: Self-pay

## 2017-06-19 DIAGNOSIS — E119 Type 2 diabetes mellitus without complications: Secondary | ICD-10-CM | POA: Diagnosis not present

## 2017-06-19 DIAGNOSIS — Z6831 Body mass index (BMI) 31.0-31.9, adult: Secondary | ICD-10-CM | POA: Diagnosis not present

## 2017-06-19 DIAGNOSIS — I341 Nonrheumatic mitral (valve) prolapse: Secondary | ICD-10-CM

## 2017-06-19 NOTE — Telephone Encounter (Signed)
Patient made aware of results. Patient verbalizes understanding and would like to further discuss results with MD. Will forward to Dr. Radford Pax     Notes recorded by Sueanne Margarita, MD on 06/19/2017 at 6:42 AM EST Mildly dilated ascending aorta at 66mm which is stable - repeat echo in 1 year  Notes recorded by Sueanne Margarita, MD on 06/19/2017 at 6:40 AM EST Echo showed normal LVF with mild MR

## 2017-06-19 NOTE — Telephone Encounter (Signed)
pleaes reiterate to patient that mild MR is a normal finding in a lot of patients.  If he is still concerned then set up appt with extender to discuss further

## 2017-06-23 NOTE — Telephone Encounter (Signed)
Left message to call back to schedule appointment with extender if he would like more information regarding test results.

## 2017-06-24 DIAGNOSIS — D2221 Melanocytic nevi of right ear and external auricular canal: Secondary | ICD-10-CM | POA: Diagnosis not present

## 2017-07-02 DIAGNOSIS — E119 Type 2 diabetes mellitus without complications: Secondary | ICD-10-CM | POA: Diagnosis not present

## 2017-07-04 ENCOUNTER — Encounter: Payer: Self-pay | Admitting: Cardiology

## 2017-07-04 ENCOUNTER — Encounter (INDEPENDENT_AMBULATORY_CARE_PROVIDER_SITE_OTHER): Payer: Self-pay

## 2017-07-04 ENCOUNTER — Ambulatory Visit (INDEPENDENT_AMBULATORY_CARE_PROVIDER_SITE_OTHER): Payer: BLUE CROSS/BLUE SHIELD | Admitting: Cardiology

## 2017-07-04 VITALS — BP 102/66 | HR 66 | Ht 74.0 in | Wt 247.1 lb

## 2017-07-04 DIAGNOSIS — I340 Nonrheumatic mitral (valve) insufficiency: Secondary | ICD-10-CM

## 2017-07-04 NOTE — Progress Notes (Signed)
07/04/2017 Preston Gamble   04-29-1961  [id number]  Primary Physician Tisovec, Fransico Him, MD Primary Cardiologist: Dr. Radford Pax   Reason for Visit/CC: F/u for MVP w/ mild MR and mildly dilated Aortic Root.   HPI:  Preston Gamble is a 56 y.o. male who is being seen today for the evaluation MVP with mild MR and mildly dilated aortic root. Also with h/o HLD. No established history of CAD. He is followed by Dr. Radford Pax. He recently had an Echocardiogram 06/09/17 that showed normal LVF with mild MR and mildly dilated ascending aorta at 39 mm, which is stable in comparison to previous echo. Dr. Radford Pax recommended f/u in 1 year with repeat echo, however the patient wanted to further discuss diagnosis of MR. Pt is w/o cardiac symptoms. No CP, dyspnea or palpitations. VSS.   2D Echo 06/09/17 Study Conclusions  - Left ventricle: The cavity size was normal. Wall thickness was   increased in a pattern of mild LVH. Systolic function was normal.   The estimated ejection fraction was in the range of 55% to 60%. - Mitral valve: There was mild regurgitation. - Right atrium: The atrium was mildly dilated. - Pulmonary arteries: PA peak pressure: 34 mm Hg (S).  No outpatient medications have been marked as taking for the 07/04/17 encounter (Office Visit) with Consuelo Pandy, PA-C.   Allergies  Allergen Reactions  . Aspirin    Past Medical History:  Diagnosis Date  . Chest pain    due to injury  . Diabetes mellitus without complication ([place])   . Dilated aortic root ([place]) 05/27/2015   4cm by Chest CT 05/2015  . Family history of early CAD 05/17/2016  . Hyperlipidemia   . Mitral valve prolapse    with mild MR  . Umbilical hernia    Family History  Problem Relation Age of Onset  . CAD Father   . Diabetes Father   . Hyperlipidemia Sister   . Colon cancer Neg Hx   . Pancreatic cancer Neg Hx   . Stomach cancer Neg Hx    Past Surgical History:  Procedure Laterality Date  .  PLANTAR FASCIA RELEASE    . SEPTOPLASTY  1994  . UMBILICAL HERNIA REPAIR  07/09/2012   Procedure: HERNIA REPAIR UMBILICAL ADULT;  Surgeon: Haywood Lasso, MD;  Location: [facility];  Service: General;  Laterality: N/A;  repair umbilical hernia   Social History   Socioeconomic History  . Marital status: Married    Spouse name: Not on file  . Number of children: Not on file  . Years of education: Not on file  . Highest education level: Not on file  Social Needs  . Financial resource strain: Not on file  . Food insecurity - worry: Not on file  . Food insecurity - inability: Not on file  . Transportation needs - medical: Not on file  . Transportation needs - non-medical: Not on file  Occupational History  . Not on file  Tobacco Use  . Smoking status: Former Smoker    Last attempt to quit: 05/20/1997    Years since quitting: 20.1  . Smokeless tobacco: Never Used  Substance and Sexual Activity  . Alcohol use: Yes    Alcohol/week: 1.8 oz    Types: 3 Cans of beer per week    Comment: occasional  . Drug use: No  . Sexual activity: Not on file  Other Topics Concern  . Not on file  Social History Narrative  .  Not on file     Review of Systems: General: negative for chills, fever, night sweats or weight changes.  Cardiovascular: negative for chest pain, dyspnea on exertion, edema, orthopnea, palpitations, paroxysmal nocturnal dyspnea or shortness of breath Dermatological: negative for rash Respiratory: negative for cough or wheezing Urologic: negative for hematuria Abdominal: negative for nausea, vomiting, diarrhea, bright red blood per rectum, melena, or hematemesis Neurologic: negative for visual changes, syncope, or dizziness All other systems reviewed and are otherwise negative except as noted above.   Physical Exam:  Blood pressure 102/66, pulse 66, height 6\' 2"  (1.88 m), weight 247 lb 1.9 oz (112.1 kg), SpO2 98 %.  General appearance: alert, cooperative  and no distress Neck: no carotid bruit and no JVD Lungs: clear to auscultation bilaterally Heart: regular rate and rhythm, S1, S2 normal, no murmur, click, rub or gallop Extremities: extremities normal, atraumatic, no cyanosis or edema Pulses: 2+ and symmetric Skin: Skin color, texture, turgor normal. No rashes or lesions Neurologic: Grossly normal  EKG not performed-- personally reviewed   ASSESSMENT AND PLAN:   1. MVP with mild MR: noted on echo. Pt asymptomatic. Plan for f/u 2D echo in 1 year.   2. Mildly Dilated Aortic Root: 39 mm. Monitor. F/u echo in 1 year. BP is well controlled and he is on statin therapy.    Follow-Up w/ Dr. Radford Pax in 1 year   Preston Gamble, MHS [facility] 07/04/2017 11:40 AM

## 2017-07-04 NOTE — Patient Instructions (Signed)
Medication Instructions:  Your physician recommends that you continue on your current medications as directed. Please refer to the Current Medication list given to you today.   Labwork: None Ordered   Testing/Procedures: None Ordered   Follow-Up: Your physician wants you to follow-up in: November 2019 with Dr. Radford Pax.  You will receive a reminder letter in the mail two months in advance. If you don't receive a letter, please call our office to schedule the follow-up appointment.   If you need a refill on your cardiac medications before your next appointment, please call your pharmacy.   Thank you for choosing [facility]! Christen Bame, RN [phone]

## 2017-10-21 DIAGNOSIS — Z6831 Body mass index (BMI) 31.0-31.9, adult: Secondary | ICD-10-CM | POA: Diagnosis not present

## 2017-10-21 DIAGNOSIS — E119 Type 2 diabetes mellitus without complications: Secondary | ICD-10-CM | POA: Diagnosis not present

## 2018-03-04 DIAGNOSIS — E119 Type 2 diabetes mellitus without complications: Secondary | ICD-10-CM | POA: Diagnosis not present

## 2018-03-04 DIAGNOSIS — Z23 Encounter for immunization: Secondary | ICD-10-CM | POA: Diagnosis not present

## 2018-04-01 ENCOUNTER — Ambulatory Visit (INDEPENDENT_AMBULATORY_CARE_PROVIDER_SITE_OTHER): Payer: BLUE CROSS/BLUE SHIELD | Admitting: Podiatry

## 2018-04-01 ENCOUNTER — Encounter: Payer: Self-pay | Admitting: Podiatry

## 2018-04-01 ENCOUNTER — Ambulatory Visit (INDEPENDENT_AMBULATORY_CARE_PROVIDER_SITE_OTHER): Payer: BLUE CROSS/BLUE SHIELD

## 2018-04-01 VITALS — BP 120/78

## 2018-04-01 DIAGNOSIS — M722 Plantar fascial fibromatosis: Secondary | ICD-10-CM

## 2018-04-01 MED ORDER — TRIAMCINOLONE ACETONIDE 10 MG/ML IJ SUSP
10.0000 mg | Freq: Once | INTRAMUSCULAR | Status: AC
  Administered 2018-04-01: 10 mg

## 2018-04-04 NOTE — Progress Notes (Signed)
Subjective:   Patient ID: Preston Gamble, male   DOB: 57 y.o.   MRN: [id number]   HPI Patient states that he started to develop a lot of pain in the back and bottom of his right heel and is not sure what happened but he is active   ROS      Objective:  Physical Exam  Neurovascular status intact with exquisite discomfort plantar lateral aspect of the right heel     Assessment:  Acute plantar fasciitis plantar lateral right     Plan:  X-ray reviewed and today careful lateral injection administered 3 mg Kenalog 5 mg Xylocaine advised on reduced activity and reappoint to recheck  X-ray indicated spur with no indication of stress fracture arthritis

## 2018-05-06 DIAGNOSIS — Z0000 Encounter for general adult medical examination without abnormal findings: Secondary | ICD-10-CM | POA: Diagnosis not present

## 2018-05-06 DIAGNOSIS — R82998 Other abnormal findings in urine: Secondary | ICD-10-CM | POA: Diagnosis not present

## 2018-05-06 DIAGNOSIS — Z125 Encounter for screening for malignant neoplasm of prostate: Secondary | ICD-10-CM | POA: Diagnosis not present

## 2018-05-06 DIAGNOSIS — E119 Type 2 diabetes mellitus without complications: Secondary | ICD-10-CM | POA: Diagnosis not present

## 2018-05-13 DIAGNOSIS — Z1389 Encounter for screening for other disorder: Secondary | ICD-10-CM | POA: Diagnosis not present

## 2018-05-13 DIAGNOSIS — D126 Benign neoplasm of colon, unspecified: Secondary | ICD-10-CM | POA: Diagnosis not present

## 2018-05-13 DIAGNOSIS — I341 Nonrheumatic mitral (valve) prolapse: Secondary | ICD-10-CM | POA: Diagnosis not present

## 2018-05-13 DIAGNOSIS — Z125 Encounter for screening for malignant neoplasm of prostate: Secondary | ICD-10-CM | POA: Diagnosis not present

## 2018-05-13 DIAGNOSIS — I77819 Aortic ectasia, unspecified site: Secondary | ICD-10-CM | POA: Diagnosis not present

## 2018-05-13 DIAGNOSIS — Z0000 Encounter for general adult medical examination without abnormal findings: Secondary | ICD-10-CM | POA: Diagnosis not present

## 2018-05-13 DIAGNOSIS — E7849 Other hyperlipidemia: Secondary | ICD-10-CM | POA: Diagnosis not present

## 2018-06-09 ENCOUNTER — Ambulatory Visit (INDEPENDENT_AMBULATORY_CARE_PROVIDER_SITE_OTHER): Payer: BLUE CROSS/BLUE SHIELD | Admitting: Cardiology

## 2018-06-09 ENCOUNTER — Other Ambulatory Visit: Payer: Self-pay

## 2018-06-09 ENCOUNTER — Encounter: Payer: Self-pay | Admitting: Cardiology

## 2018-06-09 ENCOUNTER — Ambulatory Visit (HOSPITAL_COMMUNITY): Payer: BLUE CROSS/BLUE SHIELD | Attending: Cardiology

## 2018-06-09 ENCOUNTER — Telehealth: Payer: Self-pay

## 2018-06-09 VITALS — BP 112/78 | HR 59 | Ht 74.0 in | Wt 253.6 lb

## 2018-06-09 DIAGNOSIS — R079 Chest pain, unspecified: Secondary | ICD-10-CM

## 2018-06-09 DIAGNOSIS — I341 Nonrheumatic mitral (valve) prolapse: Secondary | ICD-10-CM

## 2018-06-09 DIAGNOSIS — R0789 Other chest pain: Secondary | ICD-10-CM | POA: Diagnosis not present

## 2018-06-09 DIAGNOSIS — I77810 Thoracic aortic ectasia: Secondary | ICD-10-CM

## 2018-06-09 DIAGNOSIS — E119 Type 2 diabetes mellitus without complications: Secondary | ICD-10-CM | POA: Diagnosis not present

## 2018-06-09 LAB — ECHOCARDIOGRAM COMPLETE
Height: 74 in
Weight: 4057.6 oz

## 2018-06-09 NOTE — Telephone Encounter (Signed)
Spoke with the patient, he expressed understanding and accepted the repeat echo in 1 year.

## 2018-06-09 NOTE — Progress Notes (Signed)
Cardiology Office Note:    Date:  06/09/2018   ID:  Preston Gamble, DOB [birth year], MRN [id number]  PCP:  Haywood Pao, MD  Cardiologist:  No primary care provider on file.    Referring MD: Haywood Pao, MD   Chief Complaint  Patient presents with  . Mitral Regurgitation    History of Present Illness:    Preston Gamble is a 57 y.o. male with a hx of mitral valve prolapse and mild MRas well as dilated aortic root.   He is here today for followup and is doing well.  He denies any PND, orthopnea, LE edema, dizziness, palpitations or syncope. He has some DOE which he attributes to deconditioning.  He has had a few bouts of chest tightness over the past year that are nonexertional and not associated with any other sx. He is compliant with his meds and is tolerating meds with no SE.    Past Medical History:  Diagnosis Date  . Chest pain    due to injury  . Diabetes mellitus without complication ([place])   . Dilated aortic root ([employer]) 05/27/2015   4cm by Chest CT 05/2015  . Family history of early CAD 05/17/2016  . Hyperlipidemia   . Mitral valve prolapse    with mild MR  . Umbilical hernia     Past Surgical History:  Procedure Laterality Date  . PLANTAR FASCIA RELEASE    . SEPTOPLASTY  1994  . UMBILICAL HERNIA REPAIR  07/09/2012   Procedure: HERNIA REPAIR UMBILICAL ADULT;  Surgeon: Haywood Lasso, MD;  Location: [facility];  Service: General;  Laterality: N/A;  repair umbilical hernia    Current Medications: Current Meds  Medication Sig  . aspirin EC 81 MG tablet Take 81 mg by mouth daily.  Marland Kitchen atorvastatin (LIPITOR) 40 MG tablet Take 40 mg by mouth daily.   Marland Kitchen BYDUREON BCISE 2 MG/0.85ML AUIJ Inject 2 mg once a week as directed.  Marland Kitchen FARXIGA 10 MG TABS tablet TAKE 1 TABLET VMY MOUTH ONCE DAILY  . metFORMIN (GLUCOPHAGE) 1000 MG tablet Take 1 tablet by mouth 2 (two) times daily.     Allergies:   Patient has no active allergies.   Social  History   Socioeconomic History  . Marital status: Married    Spouse name: Not on file  . Number of children: Not on file  . Years of education: Not on file  . Highest education level: Not on file  Occupational History  . Not on file  Social Needs  . Financial resource strain: Not on file  . Food insecurity:    Worry: Not on file    Inability: Not on file  . Transportation needs:    Medical: Not on file    Non-medical: Not on file  Tobacco Use  . Smoking status: Former Smoker    Last attempt to quit: 05/20/1997    Years since quitting: 21.0  . Smokeless tobacco: Never Used  Substance and Sexual Activity  . Alcohol use: Yes    Alcohol/week: 3.0 standard drinks    Types: 3 Cans of beer per week    Comment: occasional  . Drug use: No  . Sexual activity: Not on file  Lifestyle  . Physical activity:    Days per week: Not on file    Minutes per session: Not on file  . Stress: Not on file  Relationships  . Social connections:    Talks on phone: Not on  file    Gets together: Not on file    Attends religious service: Not on file    Active member of club or organization: Not on file    Attends meetings of clubs or organizations: Not on file    Relationship status: Not on file  Other Topics Concern  . Not on file  Social History Narrative  . Not on file     Family History: The patient's family history includes CAD in his father; Diabetes in his father; Hyperlipidemia in his sister. There is no history of Colon cancer, Pancreatic cancer, or Stomach cancer.  ROS:   Please see the history of present illness.    ROS  All other systems reviewed and negative.   EKGs/Labs/Other Studies Reviewed:    The following studies were reviewed today: none  EKG:  EKG is  ordered today.  The ekg ordered today demonstrates sinus bradycardia at 59bpm with no ST changes  Recent Labs: No results found for requested labs within last 8760 hours.   Recent Lipid Panel No results found  for: CHOL, TRIG, HDL, CHOLHDL, VLDL, LDLCALC, LDLDIRECT  Physical Exam:    VS:  BP 112/78   Pulse (!) 59   Ht 6\' 2"  (1.88 m)   Wt 253 lb 9.6 oz (115 kg)   BMI 32.56 kg/m     Wt Readings from Last 3 Encounters:  06/09/18 253 lb 9.6 oz (115 kg)  07/04/17 247 lb 1.9 oz (112.1 kg)  05/22/17 249 lb 6.4 oz (113.1 kg)     GEN:  Well nourished, well developed in no acute distress HEENT: Normal NECK: No JVD; No carotid bruits LYMPHATICS: No lymphadenopathy CARDIAC: RRR, no murmurs, rubs, gallops RESPIRATORY:  Clear to auscultation without rales, wheezing or rhonchi  ABDOMEN: Soft, non-tender, non-distended MUSCULOSKELETAL:  No edema; No deformity  SKIN: Warm and dry NEUROLOGIC:  Alert and oriented x 3 PSYCHIATRIC:  Normal affect   ASSESSMENT:    1. Chest tightness   2. MVP (mitral valve prolapse)   3. Dilated aortic root ([place])   4. Diabetes mellitus without complication ([place])    PLAN:    In order of problems listed above:  1.  MVP  - last echo 06/2017 showed normal LVF with mild MR.  2.  Dilated aortic root - echo 06/2017 showed 59mm aortic root and dilated ascending aorta 83mm.  I will repeat echo to make sure this is stable.  He will continue on statin.  3.  DM - this is followed by his PCP.  He will continue on Metformin  4.  Chest tightness - he has had a few bouts of pressure in his left chest that is short lived with no associated sx.  Given his DM, I have recommended ETT to rule out ischemia.     Medication Adjustments/Labs and Tests Ordered: Current medicines are reviewed at length with the patient today.  Concerns regarding medicines are outlined above.  Orders Placed This Encounter  Procedures  . EKG 12-Lead   No orders of the defined types were placed in this encounter.   Signed, Fransico Him, MD  06/09/2018 8:15 AM    [place]

## 2018-06-09 NOTE — Patient Instructions (Signed)
Medication Instructions:  Your physician recommends that you continue on your current medications as directed. Please refer to the Current Medication list given to you today.  If you need a refill on your cardiac medications before your next appointment, please call your pharmacy.   Lab work:  If you have labs (blood work) drawn today and your tests are completely normal, you will receive your results only by: Marland Kitchen MyChart Message (if you have MyChart) OR . A paper copy in the mail If you have any lab test that is abnormal or we need to change your treatment, we will call you to review the results.  Testing/Procedures: Your physician has requested that you have an exercise tolerance test. For further information please visit [email or web address]. Please also follow instruction sheet, as given.  Follow-Up: At [facility], you and your health needs are our priority.  As part of our continuing mission to provide you with exceptional heart care, we have created designated Provider Care Teams.  These Care Teams include your primary Cardiologist (physician) and Advanced Practice Providers (APPs -  Physician Assistants and Nurse Practitioners) who all work together to provide you with the care you need, when you need it. You will need a follow up appointment in 1 years.  Please call our office 2 months in advance to schedule this appointment.  You may see Dr. Radford Pax or one of the following Advanced Practice Providers on your designated Care Team:   [place], PA-C Melina Copa, PA-C . Ermalinda Barrios, PA-C

## 2018-06-09 NOTE — Telephone Encounter (Signed)
-----   Message from Sueanne Margarita, MD sent at 06/09/2018  1:02 PM EST ----- Echo showed normal LVF with EF 60-65% with mildly calcified AV with no AS, mildly dilated ascending aorta at 57mm and no MVP but trivial MR - repeat echo in 1 year to reassess aorta

## 2018-06-22 DIAGNOSIS — E119 Type 2 diabetes mellitus without complications: Secondary | ICD-10-CM | POA: Diagnosis not present

## 2018-06-22 DIAGNOSIS — H5203 Hypermetropia, bilateral: Secondary | ICD-10-CM | POA: Diagnosis not present

## 2018-06-25 ENCOUNTER — Ambulatory Visit (INDEPENDENT_AMBULATORY_CARE_PROVIDER_SITE_OTHER): Payer: BLUE CROSS/BLUE SHIELD

## 2018-06-25 DIAGNOSIS — R0789 Other chest pain: Secondary | ICD-10-CM

## 2018-06-25 DIAGNOSIS — R079 Chest pain, unspecified: Secondary | ICD-10-CM

## 2018-06-25 LAB — EXERCISE TOLERANCE TEST
Estimated workload: 15.3 METS
Exercise duration (min): 13 min
Exercise duration (sec): 3 s
MPHR: 165 {beats}/min
Peak HR: 157 {beats}/min
Percent HR: 95 %
RPE: 15
Rest HR: 64 {beats}/min

## 2018-11-26 DIAGNOSIS — E669 Obesity, unspecified: Secondary | ICD-10-CM | POA: Diagnosis not present

## 2018-11-26 DIAGNOSIS — E119 Type 2 diabetes mellitus without complications: Secondary | ICD-10-CM | POA: Diagnosis not present

## 2019-03-02 DIAGNOSIS — E119 Type 2 diabetes mellitus without complications: Secondary | ICD-10-CM | POA: Diagnosis not present

## 2019-03-05 DIAGNOSIS — Z981 Arthrodesis status: Secondary | ICD-10-CM | POA: Diagnosis not present

## 2019-03-05 DIAGNOSIS — M4722 Other spondylosis with radiculopathy, cervical region: Secondary | ICD-10-CM | POA: Diagnosis not present

## 2019-03-05 DIAGNOSIS — M542 Cervicalgia: Secondary | ICD-10-CM | POA: Diagnosis not present

## 2019-03-05 DIAGNOSIS — M5412 Radiculopathy, cervical region: Secondary | ICD-10-CM | POA: Diagnosis not present

## 2019-03-05 DIAGNOSIS — M5030 Other cervical disc degeneration, unspecified cervical region: Secondary | ICD-10-CM | POA: Diagnosis not present

## 2019-03-10 DIAGNOSIS — E559 Vitamin D deficiency, unspecified: Secondary | ICD-10-CM | POA: Diagnosis not present

## 2019-03-10 DIAGNOSIS — E119 Type 2 diabetes mellitus without complications: Secondary | ICD-10-CM | POA: Diagnosis not present

## 2019-03-10 DIAGNOSIS — E7849 Other hyperlipidemia: Secondary | ICD-10-CM | POA: Diagnosis not present

## 2019-03-10 DIAGNOSIS — Z23 Encounter for immunization: Secondary | ICD-10-CM | POA: Diagnosis not present

## 2019-05-10 DIAGNOSIS — M5412 Radiculopathy, cervical region: Secondary | ICD-10-CM | POA: Diagnosis not present

## 2019-05-10 DIAGNOSIS — M5030 Other cervical disc degeneration, unspecified cervical region: Secondary | ICD-10-CM | POA: Diagnosis not present

## 2019-05-10 DIAGNOSIS — Z981 Arthrodesis status: Secondary | ICD-10-CM | POA: Diagnosis not present

## 2019-05-10 DIAGNOSIS — M4722 Other spondylosis with radiculopathy, cervical region: Secondary | ICD-10-CM | POA: Diagnosis not present

## 2019-05-12 DIAGNOSIS — E7849 Other hyperlipidemia: Secondary | ICD-10-CM | POA: Diagnosis not present

## 2019-05-12 DIAGNOSIS — Z0000 Encounter for general adult medical examination without abnormal findings: Secondary | ICD-10-CM | POA: Diagnosis not present

## 2019-05-12 DIAGNOSIS — Z125 Encounter for screening for malignant neoplasm of prostate: Secondary | ICD-10-CM | POA: Diagnosis not present

## 2019-05-18 DIAGNOSIS — E119 Type 2 diabetes mellitus without complications: Secondary | ICD-10-CM | POA: Diagnosis not present

## 2019-05-18 DIAGNOSIS — E7800 Pure hypercholesterolemia, unspecified: Secondary | ICD-10-CM | POA: Diagnosis not present

## 2019-05-18 DIAGNOSIS — D126 Benign neoplasm of colon, unspecified: Secondary | ICD-10-CM | POA: Diagnosis not present

## 2019-05-18 DIAGNOSIS — I341 Nonrheumatic mitral (valve) prolapse: Secondary | ICD-10-CM | POA: Diagnosis not present

## 2019-05-18 DIAGNOSIS — Z1331 Encounter for screening for depression: Secondary | ICD-10-CM | POA: Diagnosis not present

## 2019-05-18 DIAGNOSIS — Z0000 Encounter for general adult medical examination without abnormal findings: Secondary | ICD-10-CM | POA: Diagnosis not present

## 2019-05-25 DIAGNOSIS — M791 Myalgia, unspecified site: Secondary | ICD-10-CM | POA: Diagnosis not present

## 2019-05-25 DIAGNOSIS — Z03818 Encounter for observation for suspected exposure to other biological agents ruled out: Secondary | ICD-10-CM | POA: Diagnosis not present

## 2019-05-25 DIAGNOSIS — R6883 Chills (without fever): Secondary | ICD-10-CM | POA: Diagnosis not present

## 2019-05-25 DIAGNOSIS — H6122 Impacted cerumen, left ear: Secondary | ICD-10-CM | POA: Diagnosis not present

## 2019-05-26 DIAGNOSIS — U071 COVID-19: Secondary | ICD-10-CM | POA: Diagnosis not present

## 2019-05-26 DIAGNOSIS — Z9189 Other specified personal risk factors, not elsewhere classified: Secondary | ICD-10-CM | POA: Diagnosis not present

## 2019-05-26 DIAGNOSIS — H6122 Impacted cerumen, left ear: Secondary | ICD-10-CM | POA: Diagnosis not present

## 2019-05-26 DIAGNOSIS — R509 Fever, unspecified: Secondary | ICD-10-CM | POA: Diagnosis not present

## 2019-06-09 ENCOUNTER — Ambulatory Visit (INDEPENDENT_AMBULATORY_CARE_PROVIDER_SITE_OTHER): Payer: BC Managed Care – PPO | Admitting: Cardiology

## 2019-06-09 ENCOUNTER — Encounter: Payer: Self-pay | Admitting: Cardiology

## 2019-06-09 ENCOUNTER — Other Ambulatory Visit: Payer: Self-pay

## 2019-06-09 VITALS — BP 126/74 | HR 56 | Ht 74.0 in | Wt 243.2 lb

## 2019-06-09 DIAGNOSIS — E119 Type 2 diabetes mellitus without complications: Secondary | ICD-10-CM

## 2019-06-09 DIAGNOSIS — I77810 Thoracic aortic ectasia: Secondary | ICD-10-CM | POA: Diagnosis not present

## 2019-06-09 DIAGNOSIS — Z7189 Other specified counseling: Secondary | ICD-10-CM

## 2019-06-09 DIAGNOSIS — I341 Nonrheumatic mitral (valve) prolapse: Secondary | ICD-10-CM

## 2019-06-09 DIAGNOSIS — E7800 Pure hypercholesterolemia, unspecified: Secondary | ICD-10-CM

## 2019-06-09 NOTE — Patient Instructions (Signed)
Medication Instructions:  Your physician recommends that you continue on your current medications as directed. Please refer to the Current Medication list given to you today.  *If you need a refill on your cardiac medications before your next appointment, please call your pharmacy*  Lab Work: None ordered.  Testing/Procedures: Your physician has requested that you have an echocardiogram. Echocardiography is a painless test that uses sound waves to create images of your heart. It provides your doctor with information about the size and shape of your heart and how well your heart's chambers and valves are working. This procedure takes approximately one hour. There are no restrictions for this procedure.  Follow-Up: At [facility], you and your health needs are our priority.  As part of our continuing mission to provide you with exceptional heart care, we have created designated Provider Care Teams.  These Care Teams include your primary Cardiologist (physician) and Advanced Practice Providers (APPs -  Physician Assistants and Nurse Practitioners) who all work together to provide you with the care you need, when you need it.  Your next appointment:   1 year(s)  The format for your next appointment:   In Person  Provider:   Fransico Him, MD  Other Instructions HDL is your good cholesterol - the goal is for HDL > 40 and preferably > 50 LDL is your bad cholesterol - the goal is for LDL <70

## 2019-06-09 NOTE — Progress Notes (Addendum)
Virtual Visit via Video Note   This visit type was conducted due to national recommendations for restrictions regarding the COVID-19 Pandemic (e.g. social distancing) in an effort to limit this patient's exposure and mitigate transmission in our community.  Due to his co-morbid illnesses, this patient is at least at moderate risk for complications without adequate follow up.  This format is felt to be most appropriate for this patient at this time.  All issues noted in this document were discussed and addressed.  A limited physical exam was performed with this format.  Please refer to the patient's chart for his consent to telehealth for [facility].  Evaluation Performed:  Follow-up visit  This visit type was conducted due to national recommendations for restrictions regarding the COVID-19 Pandemic (e.g. social distancing).  This format is felt to be most appropriate for this patient at this time.  All issues noted in this document were discussed and addressed.  No physical exam was performed (except for noted visual exam findings with Video Visits).  Please refer to the patient's chart (MyChart message for video visits and phone note for telephone visits) for the patient's consent to telehealth for [facility].  Date:  06/09/2019   ID:  Preston Gamble, DOB 25-Nov-1960, MRN [id number]  Patient Location:  Office  Provider location:   [facility]   Cardiology Office Note:    Date:  06/09/2019   ID:  Preston Gamble, DOB July 26, 1960, MRN [id number]  PCP:  Haywood Pao, MD  Cardiologist:  Fransico Him, MD    Referring MD: Haywood Pao, MD   Chief Complaint  Patient presents with  . Mitral Regurgitation    History of Present Illness:    Preston Gamble is a 58 y.o. male with a hx of mitral valve prolapse and mild MRas well as dilated aortic root.  He is here today for followup and is doing well. He recently had COVID 19 2 weeks ago and was treated with  steroids for SOB.  He has residual SOB that he noticed when [place].   He denies any chest pain or pressure,  PND, orthopnea, LE edema, dizziness, palpitations or syncope. He is compliant with his meds and is tolerating meds with no SE.    Past Medical History:  Diagnosis Date  . Chest pain    due to injury  . Diabetes mellitus without complication ([place])   . Dilated aortic root ([place]) 05/27/2015   4cm by Chest CT 05/2015  . Family history of early CAD 05/17/2016  . Hyperlipidemia   . Mitral valve prolapse    with mild MR  . Umbilical hernia     Past Surgical History:  Procedure Laterality Date  . PLANTAR FASCIA RELEASE    . SEPTOPLASTY  1994  . UMBILICAL HERNIA REPAIR  07/09/2012   Procedure: HERNIA REPAIR UMBILICAL ADULT;  Surgeon: Haywood Lasso, MD;  Location: [place];  Service: General;  Laterality: N/A;  repair umbilical hernia    Current Medications: Current Meds  Medication Sig  . aspirin EC 81 MG tablet Take 81 mg by mouth daily.  Marland Kitchen atorvastatin (LIPITOR) 40 MG tablet Take 40 mg by mouth daily.   Marland Kitchen BYDUREON BCISE 2 MG/0.85ML AUIJ Inject 2 mg once a week as directed.  Marland Kitchen FARXIGA 10 MG TABS tablet TAKE 1 TABLET VMY MOUTH ONCE DAILY  . metFORMIN (GLUCOPHAGE) 1000 MG tablet Take 1  tablet by mouth 2 (two) times daily.     Allergies:   Patient has no known allergies.   Social History   Socioeconomic History  . Marital status: Married    Spouse name: Not on file  . Number of children: Not on file  . Years of education: Not on file  . Highest education level: Not on file  Occupational History  . Not on file  Social Needs  . Financial resource strain: Not on file  . Food insecurity    Worry: Not on file    Inability: Not on file  . Transportation needs    Medical: Not on file    Non-medical: Not on file  Tobacco Use  . Smoking status: Former Smoker    Quit date: 05/20/1997    Years  since quitting: 22.0  . Smokeless tobacco: Never Used  Substance and Sexual Activity  . Alcohol use: Yes    Alcohol/week: 3.0 standard drinks    Types: 3 Cans of beer per week    Comment: occasional  . Drug use: No  . Sexual activity: Not on file  Lifestyle  . Physical activity    Days per week: Not on file    Minutes per session: Not on file  . Stress: Not on file  Relationships  . Social Herbalist on phone: Not on file    Gets together: Not on file    Attends religious service: Not on file    Active member of club or organization: Not on file    Attends meetings of clubs or organizations: Not on file    Relationship status: Not on file  Other Topics Concern  . Not on file  Social History Narrative  . Not on file     Family History: The patient's family history includes CAD in his father; Diabetes in his father; Hyperlipidemia in his sister. There is no history of Colon cancer, Pancreatic cancer, or Stomach cancer.  ROS:   Please see the history of present illness.    ROS  All other systems reviewed and negative.   EKGs/Labs/Other Studies Reviewed:    The following studies were reviewed today: none  EKG:  EKG is  ordered today.  The ekg ordered today demonstrates sinus bradycardia at 56bpm with no ST changes  Recent Labs: No results found for requested labs within last 8760 hours.   Recent Lipid Panel No results found for: CHOL, TRIG, HDL, CHOLHDL, VLDL, LDLCALC, LDLDIRECT  Physical Exam:    VS:  BP 126/74   Pulse (!) 56   Ht 6\' 2"  (1.88 m)   Wt 243 lb 3.2 oz (110.3 kg)   SpO2 98%   BMI 31.23 kg/m     Wt Readings from Last 3 Encounters:  06/09/19 243 lb 3.2 oz (110.3 kg)  06/09/18 253 lb 9.6 oz (115 kg)  07/04/17 247 lb 1.9 oz (112.1 kg)      ASSESSMENT:    1. MVP (mitral valve prolapse)   2. Dilated aortic root ([place])   3. Diabetes mellitus without complication ([place])   4. Pure hypercholesterolemia   5. Educated about COVID-19 virus  infection    PLAN:    In order of problems listed above:  1.  MVP   -last echo 2019 with trivial MR  2.  Dilated aortic root -echo 2019 mildly dilated at 81mm -BP controlled -continue statin -repeat echo  3.  DM  - this is followed by his PCP.   -  He will continue on Metformin  4.  HLD -LDL goal < 70 due to DM and mildly dilated aorta -his last LDL was 81 in November -we discussed at length the need to keep DM and lipids at goal given mild vascular disease of his aorta -recommended tightening control of diet and repeating lipids in 3-4 months and if LDL not < 70 then increasing Lipitor to 80mg  daily  5.  COVID 19 -recent infection 2 weeks ago -treated with steroids -still with some SOB -check 2D echo to assess LVF  Time:   Today, I have spent 20 minutes directly with the patient on telemedicine discussing medical problems including MR, dilated aorta, HLD, COVID infection.  We also reviewed the symptoms of COVID 19 and the ways to protect against contracting the virus with telehealth technology.  I spent an additional 5 minutes reviewing patient's chart including labs.  Medication Adjustments/Labs and Tests Ordered: Current medicines are reviewed at length with the patient today.  Concerns regarding medicines are outlined above.  Tests Ordered: Orders Placed This Encounter  Procedures  . EKG 12-Lead   Medication Changes: No orders of the defined types were placed in this encounter.   Disposition:  Follow up in 1 year(s)  Signed, Fransico Him, MD  06/09/2019 9:06 AM     Medication Adjustments/Labs and Tests Ordered: Current medicines are reviewed at length with the patient today.  Concerns regarding medicines are outlined above.  Orders Placed This Encounter  Procedures  . EKG 12-Lead   No orders of the defined types were placed in this encounter.   Signed, Fransico Him, MD  06/09/2019 9:11 AM    [place]

## 2019-06-15 ENCOUNTER — Other Ambulatory Visit: Payer: Self-pay

## 2019-06-15 ENCOUNTER — Ambulatory Visit (HOSPITAL_COMMUNITY): Payer: BC Managed Care – PPO | Attending: Cardiology

## 2019-06-15 DIAGNOSIS — I77810 Thoracic aortic ectasia: Secondary | ICD-10-CM | POA: Insufficient documentation

## 2019-06-21 ENCOUNTER — Telehealth: Payer: Self-pay | Admitting: *Deleted

## 2019-06-21 DIAGNOSIS — I712 Thoracic aortic aneurysm, without rupture, unspecified: Secondary | ICD-10-CM

## 2019-06-21 DIAGNOSIS — Z01812 Encounter for preprocedural laboratory examination: Secondary | ICD-10-CM

## 2019-06-21 NOTE — Telephone Encounter (Signed)
-----   Message from Sueanne Margarita, MD sent at 06/16/2019 11:34 AM EST ----- Echo showed normal LVF with mild LA enlargement, trivial leakiness of TV which is normal, mild to moderate aneurysm of the aortic root at 57mm and ascending aorta at 41mm.  Please get Chest CTA to evaluate aortic aneurysm further.  Will need BMET prior to scan.

## 2019-06-21 NOTE — Telephone Encounter (Signed)
I spoke with pt and reviewed results with him. He will come to office for BMP on 12/18 at 11:30. I told him our CT department would contact him with CT appointment information.

## 2019-06-25 ENCOUNTER — Other Ambulatory Visit: Payer: BC Managed Care – PPO | Admitting: *Deleted

## 2019-06-25 ENCOUNTER — Other Ambulatory Visit: Payer: Self-pay

## 2019-06-25 DIAGNOSIS — I712 Thoracic aortic aneurysm, without rupture, unspecified: Secondary | ICD-10-CM

## 2019-06-25 DIAGNOSIS — Z01812 Encounter for preprocedural laboratory examination: Secondary | ICD-10-CM

## 2019-06-25 LAB — BASIC METABOLIC PANEL
BUN/Creatinine Ratio: 13 (ref 9–20)
BUN: 15 mg/dL (ref 6–24)
CO2: 24 mmol/L (ref 20–29)
Calcium: 9.6 mg/dL (ref 8.7–10.2)
Chloride: 102 mmol/L (ref 96–106)
Creatinine, Ser: 1.19 mg/dL (ref 0.76–1.27)
GFR calc Af Amer: 78 mL/min/{1.73_m2} (ref >59)
GFR calc non Af Amer: 68 mL/min/{1.73_m2} (ref >59)
Glucose: 189 mg/dL — ABNORMAL HIGH (ref 65–99)
Potassium: 4.7 mmol/L (ref 3.5–5.2)
Sodium: 140 mmol/L (ref 134–144)

## 2019-06-28 DIAGNOSIS — E119 Type 2 diabetes mellitus without complications: Secondary | ICD-10-CM | POA: Diagnosis not present

## 2019-06-28 DIAGNOSIS — H5203 Hypermetropia, bilateral: Secondary | ICD-10-CM | POA: Diagnosis not present

## 2019-06-28 LAB — HM DIABETES EYE EXAM

## 2019-07-06 ENCOUNTER — Inpatient Hospital Stay: Admission: RE | Admit: 2019-07-06 | Payer: BC Managed Care – PPO | Source: Ambulatory Visit

## 2019-07-06 ENCOUNTER — Ambulatory Visit (INDEPENDENT_AMBULATORY_CARE_PROVIDER_SITE_OTHER)
Admission: RE | Admit: 2019-07-06 | Discharge: 2019-07-06 | Disposition: A | Discharge status: Home / Self Care | Payer: BC Managed Care – PPO | Source: Ambulatory Visit | Attending: Cardiology | Admitting: Cardiology

## 2019-07-06 ENCOUNTER — Other Ambulatory Visit: Payer: Self-pay

## 2019-07-06 DIAGNOSIS — I712 Thoracic aortic aneurysm, without rupture, unspecified: Secondary | ICD-10-CM

## 2019-07-06 MED ORDER — IOHEXOL 350 MG/ML SOLN
100.0000 mL | Freq: Once | INTRAVENOUS | Status: AC | PRN
  Administered 2019-07-06: 100 mL via INTRAVENOUS

## 2019-07-14 ENCOUNTER — Telehealth: Payer: Self-pay

## 2019-07-14 DIAGNOSIS — I77810 Thoracic aortic ectasia: Secondary | ICD-10-CM

## 2019-07-14 DIAGNOSIS — I712 Thoracic aortic aneurysm, without rupture, unspecified: Secondary | ICD-10-CM

## 2019-07-14 DIAGNOSIS — R079 Chest pain, unspecified: Secondary | ICD-10-CM

## 2019-07-14 NOTE — Telephone Encounter (Signed)
The patient has been notified of the result and verbalized understanding.  All questions (if any) were answered. Patient will follow-up with PCP in regards to lung nodule.  [name], RN 07/14/2019 1:40 PM

## 2019-07-14 NOTE — Telephone Encounter (Signed)
-----   Message from Sueanne Margarita, MD sent at 07/08/2019  7:02 PM EST ----- Chest CT showed 4.1cm ascending aortic aneurysm which is small.  There were coronary calcifications as well.  He also has a 53mm nodule in LUL.  Recommend repeat CT in 6 months for followup.  Please forward this scan to PCP to followup on lung nodule.  The ascending aortic measurement is similar to echo so to reduced radiation - please repeat 2D echo limited in 1 year for ascending aortic aneurysm.  Please get a calcium score to assess degree of coronary calcifications.

## 2019-07-26 ENCOUNTER — Telehealth: Payer: Self-pay | Admitting: Cardiology

## 2019-07-26 NOTE — Telephone Encounter (Signed)
Patient needs to be scheduled for a calcium CT scan.

## 2019-07-26 NOTE — Telephone Encounter (Signed)
Patient was told he would need further Cardiac testing, but has not been contacted by the office to set up any tests. He needs to know what the office needs him to do in regards to any future testing

## 2019-08-13 ENCOUNTER — Ambulatory Visit (INDEPENDENT_AMBULATORY_CARE_PROVIDER_SITE_OTHER)
Admission: RE | Admit: 2019-08-13 | Discharge: 2019-08-13 | Disposition: A | Discharge status: Home / Self Care | Payer: Self-pay | Source: Ambulatory Visit | Attending: Cardiology | Admitting: Cardiology

## 2019-08-13 ENCOUNTER — Telehealth: Payer: Self-pay | Admitting: *Deleted

## 2019-08-13 ENCOUNTER — Other Ambulatory Visit: Payer: Self-pay

## 2019-08-13 DIAGNOSIS — I77810 Thoracic aortic ectasia: Secondary | ICD-10-CM

## 2019-08-13 DIAGNOSIS — E7800 Pure hypercholesterolemia, unspecified: Secondary | ICD-10-CM

## 2019-08-13 DIAGNOSIS — I712 Thoracic aortic aneurysm, without rupture, unspecified: Secondary | ICD-10-CM

## 2019-08-13 DIAGNOSIS — R079 Chest pain, unspecified: Secondary | ICD-10-CM

## 2019-08-13 MED ORDER — ATORVASTATIN CALCIUM 80 MG PO TABS: 80.0000 mg | ORAL_TABLET | Freq: Every day | ORAL | 3 refills | Status: DC

## 2019-08-13 NOTE — Telephone Encounter (Signed)
Sueanne Margarita, MD  08/13/2019 9:39 AM EST    Coronary calcium score was 101. Needs aggressive treatment of lipids. Please get a copy of his last FLP and ALT from PCP. He also has a mildly dilated aorta at 32mm (normal < 43mm), Please have him check his BP daily for a week and call with results.    I spoke with patient and reviewed results and recommendations from Dr Radford Pax with him. He is concerned because last Calcium score in 2016 was 0 and it is now 101.  Also concerned about 77th percentile.  He is not having any chest pain but reports a strong family history. I told him I would send message to Dr Radford Pax for review. He does not have BP cuff but will purchase one He will come in for fasting lab work on March 22,2021.  Will send prescription for increase dose of atorvastatin to [facility] on [address] and [address].

## 2019-08-13 NOTE — Telephone Encounter (Signed)
He is at the age where we start seeing CAD.  Also he may have had noncalcified plaque in 2016 that would not have been picked up and with statin therapy it becomes calcified.  A noncalcified plaque is more unstable and with statin therapy can become calcified which actually makes the plaque more stable.

## 2019-08-13 NOTE — Telephone Encounter (Signed)
-----   Message from Sueanne Margarita, MD sent at 08/13/2019  9:42 AM EST ----- Correction I found his lipids and his LDL was 81 in Nov and goal is < 70. Marland Kitchen Please increase Atorvastatin to 80mg  daily and repeat FLP and ALT in 6 weeks

## 2019-08-16 ENCOUNTER — Telehealth: Payer: Self-pay

## 2019-08-16 DIAGNOSIS — I77810 Thoracic aortic ectasia: Secondary | ICD-10-CM

## 2019-08-16 NOTE — Telephone Encounter (Signed)
Spoke with patient and informed him of Dr. Theodosia Blender explanation.Also advised him on the meaning of his calcium score and importance of diet and exercise. Patient verbalized understanding.

## 2019-08-16 NOTE — Telephone Encounter (Signed)
-----   Message from Sueanne Margarita, MD sent at 08/15/2019 12:13 AM EST ----- Mildly enlarged ascending aorta.  Coronary calcium noted in moderate range in LAD.  LDL goal < 70.  Please get a copy of FLP from PCP.  Check 2D echo in 1 year to followup on dilated aorta. Followup with me in 1 year

## 2019-08-23 ENCOUNTER — Other Ambulatory Visit: Payer: BC Managed Care – PPO

## 2019-09-27 ENCOUNTER — Other Ambulatory Visit: Payer: BC Managed Care – PPO

## 2019-09-28 ENCOUNTER — Other Ambulatory Visit: Payer: BC Managed Care – PPO

## 2019-09-28 ENCOUNTER — Other Ambulatory Visit: Payer: Self-pay

## 2019-09-28 ENCOUNTER — Encounter (INDEPENDENT_AMBULATORY_CARE_PROVIDER_SITE_OTHER): Payer: Self-pay

## 2019-09-28 DIAGNOSIS — E7800 Pure hypercholesterolemia, unspecified: Secondary | ICD-10-CM

## 2019-09-28 LAB — LIPID PANEL
Chol/HDL Ratio: 3.3 ratio (ref 0.0–5.0)
Cholesterol, Total: 127 mg/dL (ref 100–199)
HDL: 39 mg/dL — ABNORMAL LOW (ref >39)
LDL Chol Calc (NIH): 67 mg/dL (ref 0–99)
Triglycerides: 118 mg/dL (ref 0–149)
VLDL Cholesterol Cal: 21 mg/dL (ref 5–40)

## 2019-09-28 LAB — ALT: ALT: 43 IU/L (ref 0–44)

## 2020-03-20 DIAGNOSIS — E1165 Type 2 diabetes mellitus with hyperglycemia: Secondary | ICD-10-CM | POA: Diagnosis not present

## 2020-03-20 DIAGNOSIS — Z6832 Body mass index (BMI) 32.0-32.9, adult: Secondary | ICD-10-CM | POA: Diagnosis not present

## 2020-03-20 DIAGNOSIS — E559 Vitamin D deficiency, unspecified: Secondary | ICD-10-CM | POA: Diagnosis not present

## 2020-03-20 DIAGNOSIS — E785 Hyperlipidemia, unspecified: Secondary | ICD-10-CM | POA: Diagnosis not present

## 2020-03-28 DIAGNOSIS — D2371 Other benign neoplasm of skin of right lower limb, including hip: Secondary | ICD-10-CM | POA: Diagnosis not present

## 2020-03-28 DIAGNOSIS — D2271 Melanocytic nevi of right lower limb, including hip: Secondary | ICD-10-CM | POA: Diagnosis not present

## 2020-03-28 DIAGNOSIS — L821 Other seborrheic keratosis: Secondary | ICD-10-CM | POA: Diagnosis not present

## 2020-03-28 DIAGNOSIS — B360 Pityriasis versicolor: Secondary | ICD-10-CM | POA: Diagnosis not present

## 2020-03-28 DIAGNOSIS — B078 Other viral warts: Secondary | ICD-10-CM | POA: Diagnosis not present

## 2020-06-13 ENCOUNTER — Other Ambulatory Visit: Payer: Self-pay

## 2020-06-13 ENCOUNTER — Encounter: Payer: Self-pay | Admitting: Cardiology

## 2020-06-13 ENCOUNTER — Ambulatory Visit (INDEPENDENT_AMBULATORY_CARE_PROVIDER_SITE_OTHER): Payer: BC Managed Care – PPO | Admitting: Cardiology

## 2020-06-13 VITALS — BP 114/70 | HR 57 | Ht 74.0 in | Wt 253.0 lb

## 2020-06-13 DIAGNOSIS — E119 Type 2 diabetes mellitus without complications: Secondary | ICD-10-CM

## 2020-06-13 DIAGNOSIS — I77810 Thoracic aortic ectasia: Secondary | ICD-10-CM

## 2020-06-13 DIAGNOSIS — E7800 Pure hypercholesterolemia, unspecified: Secondary | ICD-10-CM

## 2020-06-13 DIAGNOSIS — I341 Nonrheumatic mitral (valve) prolapse: Secondary | ICD-10-CM | POA: Diagnosis not present

## 2020-06-13 DIAGNOSIS — R918 Other nonspecific abnormal finding of lung field: Secondary | ICD-10-CM

## 2020-06-13 DIAGNOSIS — R931 Abnormal findings on diagnostic imaging of heart and coronary circulation: Secondary | ICD-10-CM

## 2020-06-13 NOTE — Patient Instructions (Signed)
Medication Instructions:  Your physician recommends that you continue on your current medications as directed. Please refer to the Current Medication list given to you today.  *If you need a refill on your cardiac medications before your next appointment, please call your pharmacy*  Testing/Procedures: Your physician has requested that you have en exercise stress myoview. For further information please visit [email or web address]. Please follow instruction sheet, as given.  Your physician has requested that you have chest CT.    Follow-Up: At [facility], you and your health needs are our priority.  As part of our continuing mission to provide you with exceptional heart care, we have created designated Provider Care Teams.  These Care Teams include your primary Cardiologist (physician) and Advanced Practice Providers (APPs -  Physician Assistants and Nurse Practitioners) who all work together to provide you with the care you need, when you need it.   Your next appointment:   1 year(s)  The format for your next appointment:   In Person  Provider:   You may see Fransico Him, MD or one of the following Advanced Practice Providers on your designated Care Team:    Melina Copa, PA-C  Ermalinda Barrios, PA-C

## 2020-06-13 NOTE — Addendum Note (Signed)
Addended by: [name] on: 06/13/2020 02:05 PM   Modules accepted: Orders

## 2020-06-13 NOTE — Addendum Note (Signed)
Addended by: [name] on: 06/13/2020 09:09 AM   Modules accepted: Orders

## 2020-06-13 NOTE — Progress Notes (Signed)
Date:  06/09/2019   ID:  [place] WENZLICK, DOB January 17, 1961, MRN [id number]   Cardiology Office Note:    Date:  06/13/2020   ID:  [name], DOB February 12, 1961, MRN [id number]  PCP:  Haywood Pao, MD  Cardiologist:  Fransico Him, MD    Referring MD: Haywood Pao, MD   Chief Complaint  Patient presents with  . Follow-up    MVP with MR and dilated aortic root    History of Present Illness:    Preston Gamble is a 59 y.o. male with a hx of mitral valve prolapse and mild MRas well as dilated aortic root. He also had coronary artery ca+ with a score of 101 which is 77th% for his age and sex.  He also was noted to have a 22mm ground glass density in the LUL on CT 06/2019 but not noted on coronary CTA 08/2019 but not scanned high enough to evaluate.    He is here today for followup and is doing well.  He denies any chest pain or pressure,  PND, orthopnea, LE edema, dizziness, palpitations or syncope. He walks 3-4 miles daily and has some mild DOE when walking up stairs or hills.  He is compliant with his meds and is tolerating meds with no SE.    Past Medical History:  Diagnosis Date  . Chest pain    due to injury  . Diabetes mellitus without complication ([place])   . Dilated aortic root ([place]) 05/27/2015   80mm by Chest CTA 2021  . Family history of early CAD 05/17/2016  . Hyperlipidemia   . Mitral valve prolapse    with mild MR  . Umbilical hernia     Past Surgical History:  Procedure Laterality Date  . PLANTAR FASCIA RELEASE    . SEPTOPLASTY  1994  . UMBILICAL HERNIA REPAIR  07/09/2012   Procedure: HERNIA REPAIR UMBILICAL ADULT;  Surgeon: Haywood Lasso, MD;  Location: [place];  Service: General;  Laterality: N/A;  repair umbilical hernia    Current Medications: Current Meds  Medication Sig  . aspirin EC 81 MG tablet Take 81 mg by mouth daily.  Marland Kitchen atorvastatin (LIPITOR) 80 MG tablet Take 1 tablet (80 mg total) by mouth daily.  Marland Kitchen  BYDUREON BCISE 2 MG/0.85ML AUIJ Inject 2 mg once a week as directed.  Marland Kitchen FARXIGA 10 MG TABS tablet TAKE 1 TABLET VMY MOUTH ONCE DAILY  . metFORMIN (GLUCOPHAGE) 1000 MG tablet Take 1 tablet by mouth daily.      Allergies:   Patient has no known allergies.   Social History   Socioeconomic History  . Marital status: Married    Spouse name: Not on file  . Number of children: Not on file  . Years of education: Not on file  . Highest education level: Not on file  Occupational History  . Not on file  Tobacco Use  . Smoking status: Former Smoker    Quit date: 05/20/1997    Years since quitting: 23.0  . Smokeless tobacco: Never Used  Substance and Sexual Activity  . Alcohol use: Yes    Alcohol/week: 3.0 standard drinks    Types: 3 Cans of beer per week    Comment: occasional  . Drug use: No  . Sexual activity: Not on file  Other Topics Concern  . Not on file  Social History Narrative  . Not on file   Social Determinants of Health   Financial Resource Strain:   .  Difficulty of Paying Living Expenses: Not on file  Food Insecurity:   . Worried About Charity fundraiser in the Last Year: Not on file  . Ran Out of Food in the Last Year: Not on file  Transportation Needs:   . Lack of Transportation (Medical): Not on file  . Lack of Transportation (Non-Medical): Not on file  Physical Activity:   . Days of Exercise per Week: Not on file  . Minutes of Exercise per Session: Not on file  Stress:   . Feeling of Stress : Not on file  Social Connections:   . Frequency of Communication with Friends and Family: Not on file  . Frequency of Social Gatherings with Friends and Family: Not on file  . Attends Religious Services: Not on file  . Active Member of Clubs or Organizations: Not on file  . Attends Archivist Meetings: Not on file  . Marital Status: Not on file     Family History: The patient's family history includes CAD in his father; Diabetes in his father;  Hyperlipidemia in his sister. There is no history of Colon cancer, Pancreatic cancer, or Stomach cancer.  ROS:   Please see the history of present illness.    ROS  All other systems reviewed and negative.   EKGs/Labs/Other Studies Reviewed:    The following studies were reviewed today: none  EKG:  EKG is  ordered today.  The ekg ordered today demonstrates sinus bradycardia at 57bpm  Recent Labs: 06/25/2019: BUN 15; Creatinine, Ser 1.19; Potassium 4.7; Sodium 140 09/28/2019: ALT 43   Recent Lipid Panel    Component Value Date/Time   CHOL 127 09/28/2019 0743   TRIG 118 09/28/2019 0743   HDL 39 (L) 09/28/2019 0743   CHOLHDL 3.3 09/28/2019 0743   LDLCALC 67 09/28/2019 0743    Physical Exam:    VS:  BP 114/70   Pulse (!) 57   Ht 6\' 2"  (1.88 m)   Wt 253 lb (114.8 kg)   SpO2 96%   BMI 32.48 kg/m     GEN: Well nourished, well developed in no acute distress HEENT: Normal NECK: No JVD; No carotid bruits LYMPHATICS: No lymphadenopathy CARDIAC:RRR, no murmurs, rubs, gallops RESPIRATORY:  Clear to auscultation without rales, wheezing or rhonchi  ABDOMEN: Soft, non-tender, non-distended MUSCULOSKELETAL:  No edema; No deformity  SKIN: Warm and dry NEUROLOGIC:  Alert and oriented x 3 PSYCHIATRIC:  Normal affect    Wt Readings from Last 3 Encounters:  06/13/20 253 lb (114.8 kg)  06/09/19 243 lb 3.2 oz (110.3 kg)  06/09/18 253 lb 9.6 oz (115 kg)      ASSESSMENT:    1. MVP (mitral valve prolapse)   2. Dilated aortic root ([place])   3. Diabetes mellitus without complication ([place])   4. Pure hypercholesterolemia   5. Agatston coronary artery calcium score between 100 and 199   6. Ground glass opacity present on imaging of lung    PLAN:    In order of problems listed above:  1.  MVP   -not noted on last echo 06/2019  2.  Dilated aortic root -CT 2/2044mildly dilated at 73mm -BP controlled -continue statin  3.  DM  -this is followed by his PCP.   -He will  continue on Metformin -seeing Endocrinology  4.  HLD -LDL goal < 70 due to DM and mildly dilated aorta -his last LDL was 67 in March 2021 -continue Lipitor 80mg  daily  5.  Coronary artery calcium -  Ca score 101 (77th % for his age) -he denies any anginal sx except for mild DOE -I will repeat an ETT   6.  LUL ground glass abnormality -noted on CT 06/2019 but coronary CTA 08/2019 did not scan high enough to evaluated -will get non contrasted Chest CT to reevaluate -I encouraged him to get a new PCP  Medication Adjustments/Labs and Tests Ordered: Current medicines are reviewed at length with the patient today.  Concerns regarding medicines are outlined above.  Tests Ordered: Orders Placed This Encounter  Procedures  . EKG 12-Lead   Medication Changes: No orders of the defined types were placed in this encounter.   Disposition:  Follow up in 1 year(s)  Signed, Fransico Him, MD  06/09/2019 9:06 AM     Medication Adjustments/Labs and Tests Ordered: Current medicines are reviewed at length with the patient today.  Concerns regarding medicines are outlined above.  Orders Placed This Encounter  Procedures  . EKG 12-Lead   No orders of the defined types were placed in this encounter.   Signed, Fransico Him, MD  06/13/2020 8:28 AM    [place]

## 2020-06-13 NOTE — Addendum Note (Signed)
Addended by: [name] on: 06/13/2020 08:34 AM   Modules accepted: Orders

## 2020-06-15 ENCOUNTER — Inpatient Hospital Stay (HOSPITAL_COMMUNITY): Admission: RE | Admit: 2020-06-15 | Payer: BC Managed Care – PPO | Source: Ambulatory Visit

## 2020-06-16 ENCOUNTER — Encounter (HOSPITAL_COMMUNITY): Payer: BC Managed Care – PPO

## 2020-06-17 ENCOUNTER — Other Ambulatory Visit (HOSPITAL_COMMUNITY)
Admission: RE | Admit: 2020-06-17 | Discharge: 2020-06-17 | Disposition: A | Discharge status: Home / Self Care | Payer: BC Managed Care – PPO | Source: Ambulatory Visit | Attending: Cardiology | Admitting: Cardiology

## 2020-06-17 DIAGNOSIS — Z01812 Encounter for preprocedural laboratory examination: Secondary | ICD-10-CM | POA: Insufficient documentation

## 2020-06-17 DIAGNOSIS — Z20822 Contact with and (suspected) exposure to covid-19: Secondary | ICD-10-CM | POA: Insufficient documentation

## 2020-06-18 LAB — SARS CORONAVIRUS 2 (TAT 6-24 HRS): SARS Coronavirus 2: NEGATIVE

## 2020-06-19 ENCOUNTER — Other Ambulatory Visit: Payer: Self-pay

## 2020-06-19 ENCOUNTER — Encounter (HOSPITAL_COMMUNITY): Payer: BC Managed Care – PPO

## 2020-06-19 ENCOUNTER — Ambulatory Visit (INDEPENDENT_AMBULATORY_CARE_PROVIDER_SITE_OTHER)
Admission: RE | Admit: 2020-06-19 | Discharge: 2020-06-19 | Disposition: A | Discharge status: Home / Self Care | Payer: BC Managed Care – PPO | Source: Ambulatory Visit | Attending: Cardiology | Admitting: Cardiology

## 2020-06-19 DIAGNOSIS — I2510 Atherosclerotic heart disease of native coronary artery without angina pectoris: Secondary | ICD-10-CM | POA: Diagnosis not present

## 2020-06-19 DIAGNOSIS — R918 Other nonspecific abnormal finding of lung field: Secondary | ICD-10-CM | POA: Diagnosis not present

## 2020-06-19 DIAGNOSIS — R911 Solitary pulmonary nodule: Secondary | ICD-10-CM | POA: Diagnosis not present

## 2020-06-20 ENCOUNTER — Ambulatory Visit (INDEPENDENT_AMBULATORY_CARE_PROVIDER_SITE_OTHER): Payer: BC Managed Care – PPO

## 2020-06-20 DIAGNOSIS — R931 Abnormal findings on diagnostic imaging of heart and coronary circulation: Secondary | ICD-10-CM | POA: Diagnosis not present

## 2020-06-20 DIAGNOSIS — E785 Hyperlipidemia, unspecified: Secondary | ICD-10-CM | POA: Diagnosis not present

## 2020-06-20 DIAGNOSIS — E559 Vitamin D deficiency, unspecified: Secondary | ICD-10-CM | POA: Diagnosis not present

## 2020-06-20 DIAGNOSIS — Z6832 Body mass index (BMI) 32.0-32.9, adult: Secondary | ICD-10-CM | POA: Diagnosis not present

## 2020-06-20 DIAGNOSIS — E1165 Type 2 diabetes mellitus with hyperglycemia: Secondary | ICD-10-CM | POA: Diagnosis not present

## 2020-06-20 LAB — EXERCISE TOLERANCE TEST
Estimated workload: 13.7 METS
Exercise duration (min): 12 min
Exercise duration (sec): 0 s
MPHR: 163 {beats}/min
Peak HR: 148 {beats}/min
Percent HR: 90 %
RPE: 15
Rest HR: 59 {beats}/min

## 2020-06-21 NOTE — Addendum Note (Signed)
Addended by: Fransico Him R on: 06/21/2020 02:50 PM   Modules accepted: Orders

## 2020-06-22 ENCOUNTER — Telehealth: Payer: Self-pay | Admitting: Cardiology

## 2020-06-22 DIAGNOSIS — R918 Other nonspecific abnormal finding of lung field: Secondary | ICD-10-CM

## 2020-06-22 DIAGNOSIS — R911 Solitary pulmonary nodule: Secondary | ICD-10-CM

## 2020-06-22 NOTE — Telephone Encounter (Signed)
Patient received test results in MyChart, he would like someone to call him to review the results. Please call/advise  Thank you!

## 2020-06-23 ENCOUNTER — Telehealth: Payer: Self-pay

## 2020-06-23 NOTE — Telephone Encounter (Signed)
error 

## 2020-06-23 NOTE — Telephone Encounter (Signed)
Preston Margarita, MD  06/22/2020 12:11 PM EST      Please let patient know that stress test was fine    Preston Margarita, MD  06/20/2020 12:35 PM EST      Patient's CT showed slight interval increase in lung nodule worrisome for possible malignancy. Needs to see pulmonary ASAP in the next week for workup. Please forward to PCP   The patient has been notified of the result and verbalized understanding.  All questions (if any) were answered. [name], RN 06/23/2020 9:38 AM  Urgent referral has been placed for pulmonology

## 2020-06-28 DIAGNOSIS — H2513 Age-related nuclear cataract, bilateral: Secondary | ICD-10-CM | POA: Diagnosis not present

## 2020-06-28 DIAGNOSIS — E119 Type 2 diabetes mellitus without complications: Secondary | ICD-10-CM | POA: Diagnosis not present

## 2020-06-28 DIAGNOSIS — H5203 Hypermetropia, bilateral: Secondary | ICD-10-CM | POA: Diagnosis not present

## 2020-06-28 LAB — HM DIABETES EYE EXAM

## 2020-07-05 ENCOUNTER — Encounter: Payer: Self-pay | Admitting: Pulmonary Disease

## 2020-07-05 ENCOUNTER — Ambulatory Visit (INDEPENDENT_AMBULATORY_CARE_PROVIDER_SITE_OTHER): Payer: BC Managed Care – PPO | Admitting: Pulmonary Disease

## 2020-07-05 ENCOUNTER — Other Ambulatory Visit: Payer: Self-pay

## 2020-07-05 VITALS — BP 118/74 | HR 72 | Temp 97.2°F | Ht 74.0 in | Wt 253.0 lb

## 2020-07-05 DIAGNOSIS — R911 Solitary pulmonary nodule: Secondary | ICD-10-CM

## 2020-07-05 DIAGNOSIS — R918 Other nonspecific abnormal finding of lung field: Secondary | ICD-10-CM

## 2020-07-05 DIAGNOSIS — Z87891 Personal history of nicotine dependence: Secondary | ICD-10-CM | POA: Diagnosis not present

## 2020-07-05 NOTE — Patient Instructions (Addendum)
Thank you for visiting Dr. Valeta Harms at [facility]. Today we recommend the following:  Orders Placed This Encounter  Procedures   Ambulatory referral to Cardiothoracic Surgery   Pulmonary Function Test   Return in about 3 months (around 10/03/2020) for Dr. Valeta Harms .    Please do your part to reduce the spread of COVID-19.

## 2020-07-05 NOTE — Progress Notes (Signed)
Synopsis: Referred in December 2021 for abnormal CT chest, left upper lobe subsolid groundglass lung nodule by Sueanne Margarita, MD  Subjective:   PATIENT ID: Preston Gamble GENDER: male DOB: 1961-06-13, MRN: [id number]  Chief Complaint  Patient presents with  . Consult    Sent by Dr. Radford Pax, found spot on lung    This is a 59 year old gentleman past medical history of diabetes, hyperlipidemia, mitral valve prolapse, family history of coronary artery disease.  Patient was referred for evaluation of abnormal CT imaging.  Patient had CT scan of the chest in December 2021 which revealed a groundglass opacity.  There was previous comparison imaging from December 2020 which showed interval increase in this subsolid groundglass nodule.  The initial nodule was 1 x 0.7 cm and now has increased to 1.1 x 0.9 cm concerning for a potential subsolid composition of a primary bronchogenic carcinoma.  Patient is a former smoker who quit in 1998.  He smoked for approximately 20 years.  He has not had any recent pulmonary function test.   Past Medical History:  Diagnosis Date  . Chest pain    due to injury  . Diabetes mellitus without complication ([place])   . Dilated aortic root ([place]) 05/27/2015   37mm by Chest CTA 2021  . Family history of early CAD 05/17/2016  . Hyperlipidemia   . Mitral valve prolapse    with mild MR  . Umbilical hernia      Family History  Problem Relation Age of Onset  . CAD Father   . Diabetes Father   . Hyperlipidemia Sister   . Colon cancer Neg Hx   . Pancreatic cancer Neg Hx   . Stomach cancer Neg Hx      Past Surgical History:  Procedure Laterality Date  . PLANTAR FASCIA RELEASE    . SEPTOPLASTY  1994  . UMBILICAL HERNIA REPAIR  07/09/2012   Procedure: HERNIA REPAIR UMBILICAL ADULT;  Surgeon: Haywood Lasso, MD;  Location: [facility];  Service: General;  Laterality: N/A;  repair umbilical hernia    Social History   Socioeconomic History   . Marital status: Married    Spouse name: Not on file  . Number of children: Not on file  . Years of education: Not on file  . Highest education level: Not on file  Occupational History  . Not on file  Tobacco Use  . Smoking status: Former Smoker    Quit date: 05/20/1997    Years since quitting: 23.1  . Smokeless tobacco: Never Used  Substance and Sexual Activity  . Alcohol use: Yes    Alcohol/week: 3.0 standard drinks    Types: 3 Cans of beer per week    Comment: occasional  . Drug use: No  . Sexual activity: Not on file  Other Topics Concern  . Not on file  Social History Narrative  . Not on file   Social Determinants of Health   Financial Resource Strain: Not on file  Food Insecurity: Not on file  Transportation Needs: Not on file  Physical Activity: Not on file  Stress: Not on file  Social Connections: Not on file  Intimate Partner Violence: Not on file     No Known Allergies   Outpatient Medications Prior to Visit  Medication Sig Dispense Refill  . aspirin EC 81 MG tablet Take 81 mg by mouth daily.    Marland Kitchen atorvastatin (LIPITOR) 80 MG tablet Take 1 tablet (80 mg total) by mouth  daily. 90 tablet 3  . BYDUREON BCISE 2 MG/0.85ML AUIJ Inject 2 mg once a week as directed.  6  . Cholecalciferol (D3-1000) 25 MCG (1000 UT) capsule Take 1,000 Units by mouth daily.    Marland Kitchen FARXIGA 10 MG TABS tablet TAKE 1 TABLET VMY MOUTH ONCE DAILY  7  . metFORMIN (GLUCOPHAGE) 1000 MG tablet Take 1 tablet by mouth daily.   5   No facility-administered medications prior to visit.    Review of Systems  Constitutional: Negative for chills, fever, malaise/fatigue and weight loss.  HENT: Negative for hearing loss, sore throat and tinnitus.   Eyes: Negative for blurred vision and double vision.  Respiratory: Negative for cough, hemoptysis, sputum production, shortness of breath, wheezing and stridor.   Cardiovascular: Negative for chest pain, palpitations, orthopnea, leg swelling and PND.   Gastrointestinal: Negative for abdominal pain, constipation, diarrhea, heartburn, nausea and vomiting.  Genitourinary: Negative for dysuria, hematuria and urgency.  Musculoskeletal: Negative for joint pain and myalgias.  Skin: Negative for itching and rash.  Neurological: Negative for dizziness, tingling, weakness and headaches.  Endo/Heme/Allergies: Negative for environmental allergies. Does not bruise/bleed easily.  Psychiatric/Behavioral: Negative for depression. The patient is nervous/anxious. The patient does not have insomnia.   All other systems reviewed and are negative.    Objective:  Physical Exam Vitals reviewed.  Constitutional:      General: He is not in acute distress.    Appearance: He is well-developed and well-nourished.  HENT:     Head: Normocephalic and atraumatic.     Mouth/Throat:     Mouth: Oropharynx is clear and moist.  Eyes:     General: No scleral icterus.    Conjunctiva/sclera: Conjunctivae normal.     Pupils: Pupils are equal, round, and reactive to light.  Neck:     Vascular: No JVD.     Trachea: No tracheal deviation.  Cardiovascular:     Rate and Rhythm: Normal rate and regular rhythm.     Pulses: Intact distal pulses.     Heart sounds: Normal heart sounds. No murmur heard.   Pulmonary:     Effort: Pulmonary effort is normal. No tachypnea, accessory muscle usage or respiratory distress.     Breath sounds: Normal breath sounds. No stridor. No wheezing, rhonchi or rales.  Abdominal:     General: Bowel sounds are normal. There is no distension.     Palpations: Abdomen is soft.     Tenderness: There is no abdominal tenderness.  Musculoskeletal:        General: No tenderness or edema.     Cervical back: Neck supple.  Lymphadenopathy:     Cervical: No cervical adenopathy.  Skin:    General: Skin is warm and dry.     Capillary Refill: Capillary refill takes less than 2 seconds.     Findings: No rash.  Neurological:     Mental Status: He is  alert and oriented to person, place, and time.  Psychiatric:        Mood and Affect: Mood and affect normal.        Behavior: Behavior normal.      Vitals:   07/05/20 0924  BP: 118/74  Pulse: 72  Temp: (!) 97.2 F (36.2 C)  SpO2: 97%  Weight: 253 lb (114.8 kg)  Height: 6\' 2"  (1.88 m)   97% on RA BMI Readings from Last 3 Encounters:  07/05/20 32.48 kg/m  06/13/20 32.48 kg/m  06/09/19 31.23 kg/m   Wt Readings from  Last 3 Encounters:  07/05/20 253 lb (114.8 kg)  06/13/20 253 lb (114.8 kg)  06/09/19 243 lb 3.2 oz (110.3 kg)     CBC    Component Value Date/Time   HGB 18.1 (H) 07/09/2012 0715     Chest Imaging: CT chest December 2021: Enlarging subsolid groundglass opacity within the left upper lobe previously 1 x 0.7 cm now it is at 1.1 x 0.9 cm in size. The patient's images have been independently reviewed by me.    Pulmonary Functions Testing Results: No flowsheet data found.  FeNO:   Pathology:   Echocardiogram:   Heart Catheterization:     Assessment & Plan:     ICD-10-CM   1. Ground glass opacity present on imaging of lung  R91.8 Ambulatory referral to Cardiothoracic Surgery    Pulmonary Function Test  2. Nodule of upper lobe of left lung  R91.1 Ambulatory referral to Cardiothoracic Surgery    Pulmonary Function Test  3. Former smoker  Z87.891 Ambulatory referral to Cardiothoracic Surgery    Pulmonary Function Test    Discussion:  This is a 59 year old, former smoker with a left upper lobe subsolid lung nodule and groundglass area.  This has been persistent since 2020 and has shown slow growth over the past year.  It was 9 mm in largest cross-section now at 1.1 mm.  Plan: Today in the office we discussed the various steps in evaluating a subsolid nodule and its percent/chance of being a malignancy. I explained that this is the one type of lung nodule that can be difficult to discern probability of being a malignancy. Due to the fact that it  has been present since 2020 and has shown slow growth in size would leave a higher suggestion that this is a potential underlying cancer such as a primary bronchogenic carcinoma. We discussed this today in the office in the next steps on evaluation.  We discussed the pros and cons of either having a biopsy before, consideration for wedge resection, consideration for lobectomy or radiation therapy.  I believe that the next best step would be for him to meet with one of our thoracic surgeons to consider resection.  I will have him set up to meet with Dr. Kipp Brood to discuss potential combination case if he thinks the lesion needs to be dye marked.  Alternatively, we discussed options including short-term follow-up with repeat imaging 3-6 months from now.  PFTs and PET ordered   Current Outpatient Medications:  .  aspirin EC 81 MG tablet, Take 81 mg by mouth daily., Disp: , Rfl:  .  atorvastatin (LIPITOR) 80 MG tablet, Take 1 tablet (80 mg total) by mouth daily., Disp: 90 tablet, Rfl: 3 .  BYDUREON BCISE 2 MG/0.85ML AUIJ, Inject 2 mg once a week as directed., Disp: , Rfl: 6 .  Cholecalciferol (D3-1000) 25 MCG (1000 UT) capsule, Take 1,000 Units by mouth daily., Disp: , Rfl:  .  FARXIGA 10 MG TABS tablet, TAKE 1 TABLET VMY MOUTH ONCE DAILY, Disp: , Rfl: 7 .  metFORMIN (GLUCOPHAGE) 1000 MG tablet, Take 1 tablet by mouth daily. , Disp: , Rfl: 5  I spent 65 minutes dedicated to the care of this patient on the date of this encounter to include pre-visit review of records, face-to-face time with the patient discussing conditions above, post visit ordering of testing, clinical documentation with the electronic health record, making appropriate referrals as documented, and communicating necessary findings to members of the patients care team.  Garner Nash, DO [facility] 07/05/2020 10:47 AM

## 2020-07-06 ENCOUNTER — Telehealth: Payer: Self-pay | Admitting: Pulmonary Disease

## 2020-07-06 NOTE — Telephone Encounter (Signed)
Spoke with the pt  He states that he was unaware of PET being ordered  He does not recall Dr Valeta Harms discussing this with him at his visit 07/05/20  He would like to have the reasoning of having this done explained to him  Please advise thanks

## 2020-07-06 NOTE — Telephone Encounter (Signed)
It will need to be done prior to completing his surgical evaluation. The PET and PFTs are required to complete his pre-surgical evaluation.   Garner Nash, DO [facility] 07/06/2020 10:26 AM

## 2020-07-06 NOTE — Telephone Encounter (Signed)
Spoke with patient and advised him of BI's response. He verbalized understanding and wishes to proceed with the PET and PFT.

## 2020-07-14 ENCOUNTER — Ambulatory Visit (HOSPITAL_COMMUNITY)
Admission: RE | Admit: 2020-07-14 | Discharge: 2020-07-14 | Disposition: A | Discharge status: Home / Self Care | Payer: BC Managed Care – PPO | Source: Ambulatory Visit | Attending: Pulmonary Disease | Admitting: Pulmonary Disease

## 2020-07-14 ENCOUNTER — Encounter: Payer: BC Managed Care – PPO | Admitting: Thoracic Surgery (Cardiothoracic Vascular Surgery)

## 2020-07-14 ENCOUNTER — Other Ambulatory Visit: Payer: Self-pay

## 2020-07-14 DIAGNOSIS — I2510 Atherosclerotic heart disease of native coronary artery without angina pectoris: Secondary | ICD-10-CM | POA: Diagnosis not present

## 2020-07-14 DIAGNOSIS — I700 Atherosclerosis of aorta: Secondary | ICD-10-CM | POA: Diagnosis not present

## 2020-07-14 DIAGNOSIS — R911 Solitary pulmonary nodule: Secondary | ICD-10-CM | POA: Diagnosis not present

## 2020-07-14 LAB — GLUCOSE, CAPILLARY: Glucose-Capillary: 139 mg/dL — ABNORMAL HIGH (ref 70–99)

## 2020-07-14 MED ORDER — FLUDEOXYGLUCOSE F - 18 (FDG) INJECTION
12.6000 | Freq: Once | INTRAVENOUS | Status: AC | PRN
  Administered 2020-07-14: 12.6 via INTRAVENOUS

## 2020-07-19 ENCOUNTER — Other Ambulatory Visit: Payer: Self-pay

## 2020-07-19 ENCOUNTER — Ambulatory Visit (INDEPENDENT_AMBULATORY_CARE_PROVIDER_SITE_OTHER): Payer: BC Managed Care – PPO | Admitting: Pulmonary Disease

## 2020-07-19 DIAGNOSIS — R911 Solitary pulmonary nodule: Secondary | ICD-10-CM

## 2020-07-19 DIAGNOSIS — R918 Other nonspecific abnormal finding of lung field: Secondary | ICD-10-CM | POA: Diagnosis not present

## 2020-07-19 DIAGNOSIS — Z87891 Personal history of nicotine dependence: Secondary | ICD-10-CM

## 2020-07-19 LAB — PULMONARY FUNCTION TEST
DL/VA % pred: 116 %
DL/VA: 4.89 ml/min/mmHg/L
DLCO cor % pred: 114 %
DLCO cor: 36.23 ml/min/mmHg
DLCO unc % pred: 114 %
DLCO unc: 36.23 ml/min/mmHg
FEF 25-75 Post: 5.15 L/sec
FEF 25-75 Pre: 4.72 L/sec
FEF2575-%Change-Post: 9 %
FEF2575-%Pred-Post: 146 %
FEF2575-%Pred-Pre: 134 %
FEV1-%Change-Post: 2 %
FEV1-%Pred-Post: 106 %
FEV1-%Pred-Pre: 103 %
FEV1-Post: 4.5 L
FEV1-Pre: 4.38 L
FEV1FVC-%Change-Post: 2 %
FEV1FVC-%Pred-Pre: 108 %
FEV6-%Change-Post: 0 %
FEV6-%Pred-Post: 99 %
FEV6-%Pred-Pre: 98 %
FEV6-Post: 5.3 L
FEV6-Pre: 5.28 L
FEV6FVC-%Change-Post: 0 %
FEV6FVC-%Pred-Post: 103 %
FEV6FVC-%Pred-Pre: 103 %
FVC-%Change-Post: 0 %
FVC-%Pred-Post: 95 %
FVC-%Pred-Pre: 95 %
FVC-Post: 5.32 L
FVC-Pre: 5.3 L
Post FEV1/FVC ratio: 85 %
Post FEV6/FVC ratio: 100 %
Pre FEV1/FVC ratio: 83 %
Pre FEV6/FVC Ratio: 100 %
RV % pred: 67 %
RV: 1.62 L
TLC % pred: 87 %
TLC: 6.8 L

## 2020-07-19 NOTE — Progress Notes (Signed)
PFT done today. 

## 2020-07-25 ENCOUNTER — Other Ambulatory Visit: Payer: Self-pay

## 2020-07-25 ENCOUNTER — Encounter: Payer: Self-pay | Admitting: Thoracic Surgery (Cardiothoracic Vascular Surgery)

## 2020-07-25 ENCOUNTER — Institutional Professional Consult (permissible substitution) (INDEPENDENT_AMBULATORY_CARE_PROVIDER_SITE_OTHER): Payer: BC Managed Care – PPO | Admitting: Thoracic Surgery (Cardiothoracic Vascular Surgery)

## 2020-07-25 DIAGNOSIS — R911 Solitary pulmonary nodule: Secondary | ICD-10-CM

## 2020-07-25 DIAGNOSIS — C3490 Malignant neoplasm of unspecified part of unspecified bronchus or lung: Secondary | ICD-10-CM | POA: Insufficient documentation

## 2020-07-25 NOTE — Progress Notes (Signed)
Preston Gamble  Referring: Garner Nash, DO Primary Care: Patient, [name], MD  Chief Complaint:    Chief Complaint  Patient presents with  . Lung Lesion    New patient consultation, PFTs 1/12, Chest CT 12/13    History of Present Illness:    Preston Gamble 60 y.o. male referred by Dr. Valeta Harms for surgical evaluation of a 1.1 cm left upper lobe pulmonary nodule.  This was originally found incidentally during a cardiac evaluation a year ago.  On repeat imaging it has grown slightly, and has had some change in its solid component.  He also underwent a PET/CT which showed no SUV uptake in the nodule.  He remains asymptomatic from a pulmonary and cardiovascular standpoint.  He denies any logic symptoms.  He is able to walk 3 to 4 miles daily.  He quit smoking over 20 years ago.       Zubrod Score: At the time of surgery this patient's most appropriate activity status/level should be described as: [x]     0    Normal activity, no symptoms []     1    Restricted in physical strenuous activity but ambulatory, able to do out light work []     2    Ambulatory and capable of self care, unable to do work activities, up and about               >50 % of waking hours                              []     3    Only limited self care, in bed greater than 50% of waking hours []     4    Completely disabled, no self care, confined to bed or chair []     5    Moribund   Past Medical History:  Diagnosis Date  . Chest pain    due to injury  . Diabetes mellitus without complication ([place])   . Dilated aortic root ([place]) 05/27/2015   57mm by Chest CTA 2021  . Family history of early CAD 05/17/2016  . Hyperlipidemia   . Mitral valve prolapse    with mild MR  . Umbilical hernia      Past Surgical History:  Procedure Laterality Date  . PLANTAR FASCIA RELEASE    . SEPTOPLASTY  1994  . UMBILICAL HERNIA REPAIR  07/09/2012   Procedure: HERNIA REPAIR UMBILICAL ADULT;  Surgeon: Haywood Lasso, MD;  Location: [facility];  Service: General;  Laterality: N/A;  repair umbilical hernia    Family History  Problem Relation Age of Onset  . CAD Father   . Diabetes Father   . Hyperlipidemia Sister   . Colon cancer Neg Hx   . Pancreatic cancer Neg Hx   . Stomach cancer Neg Hx      Social History   Tobacco Use  Smoking Status Former Smoker  . Quit date: 05/20/1997  . Years since quitting: 23.1  Smokeless Tobacco Never Used    Social History  Substance and Sexual Activity  Alcohol Use Yes  . Alcohol/week: 3.0 standard drinks  . Types: 3 Cans of beer per week   Comment: occasional     No Known Allergies  Current Outpatient Medications  Medication Sig Dispense Refill  . aspirin EC 81 MG tablet Take 81 mg by mouth daily.    Marland Kitchen atorvastatin (LIPITOR) 80 MG tablet Take 1 tablet (80 mg total) by mouth daily. 90 tablet 3  . BYDUREON BCISE 2 MG/0.85ML AUIJ Inject 2 mg once a week as directed.  6  . Cholecalciferol (D3-1000) 25 MCG (1000 UT) capsule Take 1,000 Units by mouth daily.    Marland Kitchen FARXIGA 10 MG TABS tablet TAKE 1 TABLET VMY MOUTH ONCE DAILY  7  . metFORMIN (GLUCOPHAGE) 1000 MG tablet Take 1 tablet by mouth daily.   5   No current facility-administered medications for this visit.    Review of Systems  Constitutional: Negative.   Respiratory: Negative.   Cardiovascular: Negative.   Musculoskeletal: Negative.   Neurological: Negative.      PHYSICAL EXAMINATION: BP (!) 147/83 (BP Location: Left Arm, Patient Position: Sitting, Cuff Size: Large)   Pulse 78   Resp 20   Ht 6\' 2"  (1.88 m)   Wt 251 lb 3.2 oz (113.9 kg)   SpO2 96% Comment: RA  BMI 32.25 kg/m  Physical Exam Constitutional:      General: He is not in acute distress.     Appearance: Normal appearance. He is normal weight.  HENT:     Head: Normocephalic and atraumatic.  Eyes:     Extraocular Movements: Extraocular movements intact.  Cardiovascular:     Rate and Rhythm: Normal rate.  Pulmonary:     Effort: Pulmonary effort is normal. No respiratory distress.  Abdominal:     General: Abdomen is flat. There is no distension.  Musculoskeletal:        General: Normal range of motion.     Cervical back: Normal range of motion.  Skin:    General: Skin is warm and dry.  Neurological:     General: No focal deficit present.     Mental Status: He is alert and oriented to person, place, and time.  Psychiatric:        Mood and Affect: Mood normal.     Diagnostic Studies & Laboratory data:     Recent Radiology Findings:   NM PET Image Initial (PI) Skull Base To Thigh  Result Date: 07/14/2020 CLINICAL DATA:  Initial treatment strategy for pulmonary nodule. EXAM: NUCLEAR MEDICINE PET SKULL BASE TO THIGH TECHNIQUE: 12.6 mCi F-18 FDG was injected intravenously. Full-ring PET imaging was performed from the skull base to thigh after the radiotracer. CT data was obtained and used for attenuation correction and anatomic localization. Fasting blood glucose: 139 mg/dl COMPARISON:  Chest CT 06/19/2020 FINDINGS: Mediastinal blood pool activity: SUV max 3.3 Liver activity: SUV max NA NECK: No hypermetabolic lymph nodes in the neck. Incidental CT findings: none CHEST: 11 mm left upper lobe pulmonary nodule shows no hypermetabolism with SUV max = 1.1. No other suspicious or unexpected hypermetabolic disease in the chest. Incidental CT findings: Coronary artery calcification is evident. Atherosclerotic calcification is noted in the wall of the thoracic aorta. ABDOMEN/PELVIS: No abnormal hypermetabolic activity within the liver, pancreas, adrenal glands, or spleen. No hypermetabolic lymph nodes in the abdomen or pelvis. Incidental CT findings: 7 cm exophytic cyst noted lower pole left  kidney diverticular changes are evident in the left colon without  diverticulitis. SKELETON: No focal hypermetabolic activity to suggest skeletal metastasis. Incidental CT findings: none IMPRESSION: 1. No hypermetabolism discernible in the left upper lobe pulmonary nodule. Given small size of this lesion and sub solid components, neoplasm is not excluded. Continued close follow-up recommended. 2. No other unexpected or suspicious hypermetabolic disease in the neck, chest, abdomen, or pelvis. 3.  Aortic Atherosclerois (ICD10-170.0) Electronically Signed   By: Misty Stanley M.D.   On: 07/14/2020 14:55       I have independently reviewed the above radiology studies  and reviewed the findings with the patient.   Recent Lab Findings: Lab Results  Component Value Date   HGB 18.1 (H) 07/09/2012   GLUCOSE 189 (H) 06/25/2019   CHOL 127 09/28/2019   TRIG 118 09/28/2019   HDL 39 (L) 09/28/2019   LDLCALC 67 09/28/2019   ALT 43 09/28/2019   NA 140 06/25/2019   K 4.7 06/25/2019   CL 102 06/25/2019   CREATININE 1.19 06/25/2019   BUN 15 06/25/2019   CO2 24 06/25/2019     PFTs: - FVC: 95% - FEV1: 103% -DLCO: 114%  Problem List: 1.1 cm left upper lobe pulmonary nodule.  Subsolid component.  Interval increase in size.  SUV uptake of 1.1.  Assessment / Plan:   This is a 60 year old gentleman with a left upper lobe 1.1 cm pulmonary nodule is concerning for primary lung cancer.  We discussed several options for diagnosis and treatment, but he has significant anxiety about proceeding with a lobectomy if this turns out to be a lung cancer.  We agreed on performing a combination procedure with Dr. Valeta Harms which will include a navigational bronchoscopy with biopsy and potential marking.  The patient stated that if the navigational biopsy is negative he is okay proceeding with a robotic assisted wedge resection to ensure that the nodule does not contain any cancer.  If however the navigational bronchoscopy and  biopsy does show primary lung cancer he does not want to proceed with a robotic assisted lobectomy at that time.  He would rather speak with his family before proceeding with a lobectomy versus considering SBRT.  I have discussed this with Dr. Sharma Covert and will come up with some dates.     I  spent 55 minutes with  the patient face to face in counseling and coordination of care.    Lajuana Matte 07/25/2020 3:39 PM

## 2020-07-27 ENCOUNTER — Other Ambulatory Visit: Payer: Self-pay | Admitting: Cardiology

## 2020-07-28 ENCOUNTER — Encounter: Payer: BC Managed Care – PPO | Admitting: Thoracic Surgery (Cardiothoracic Vascular Surgery)

## 2020-08-10 ENCOUNTER — Ambulatory Visit (HOSPITAL_COMMUNITY): Payer: BC Managed Care – PPO | Attending: Cardiovascular Disease

## 2020-08-10 ENCOUNTER — Other Ambulatory Visit: Payer: Self-pay

## 2020-08-10 DIAGNOSIS — I77810 Thoracic aortic ectasia: Secondary | ICD-10-CM | POA: Diagnosis not present

## 2020-08-10 LAB — ECHOCARDIOGRAM COMPLETE
Area-P 1/2: 3.2 cm2
S' Lateral: 3.6 cm

## 2020-08-15 ENCOUNTER — Telehealth: Payer: Self-pay

## 2020-08-15 DIAGNOSIS — I77810 Thoracic aortic ectasia: Secondary | ICD-10-CM

## 2020-08-15 DIAGNOSIS — I712 Thoracic aortic aneurysm, without rupture, unspecified: Secondary | ICD-10-CM

## 2020-08-15 NOTE — Telephone Encounter (Signed)
-----   Message from Sueanne Margarita, MD sent at 08/15/2020  9:55 AM EST ----- Echo showed normal heart function and dilated ascending aorta at 42mm which appears stable compared to Chest CTA 06/2019 - repeat limited echo in 1 year for dilated ascending aorta

## 2020-08-15 NOTE — Telephone Encounter (Signed)
The patient has been notified of the result and verbalized understanding.  All questions (if any) were answered. [name], RN 08/15/2020 3:28 PM

## 2020-08-21 ENCOUNTER — Telehealth: Payer: Self-pay | Admitting: Pulmonary Disease

## 2020-08-21 DIAGNOSIS — R911 Solitary pulmonary nodule: Secondary | ICD-10-CM

## 2020-08-21 NOTE — Telephone Encounter (Signed)
  I called and spoke with Dr. Kipp Brood we are looking for a day to consider a combination case. The next available day would we 08/28/2020, Monday.   I have Cc'd: Vista Lawman our Endo Coordinator to see if that is an option. It would need to be a 730AM (or earlier if possible start time) as I have clinic starting at 9 AM.  Dr. Kipp Brood is going to call the patient tomorrow.  If the 21st doesn't work please let me know and we will find an alternative  Garner Nash, DO [facility] 08/21/2020 5:23 PM

## 2020-08-21 NOTE — Telephone Encounter (Signed)
Spoke to patient, who stated that he had a visit with Dr. Kipp Brood around one month ago. Dr. Kipp Brood recommended that patient have a biopsy. Patient was under the impression that Dr. Kipp Brood would be in contact with Dr. Valeta Harms to discuss bx. Patient is calling for update.   Dr. Valeta Harms, please advise. thanks

## 2020-08-22 NOTE — Telephone Encounter (Signed)
Called and spoke with pt and he stated that Dr. Kipp Brood called him this morning.  He stated that now he is confused as to what is going on.  He stated that what Dr. Kipp Brood talked about this morning is nothing what they discussed 4 weeks ago and the pt is not ready for the resection of his lung.  He wants to discuss this with BI for another 15 mins or so.  He is flexible the rest of the week and he will be traveling next week.  BI please advise. Thanks

## 2020-08-23 ENCOUNTER — Other Ambulatory Visit: Payer: Self-pay | Admitting: *Deleted

## 2020-08-23 ENCOUNTER — Encounter: Payer: Self-pay | Admitting: *Deleted

## 2020-08-23 DIAGNOSIS — R911 Solitary pulmonary nodule: Secondary | ICD-10-CM

## 2020-08-23 NOTE — Telephone Encounter (Signed)
PCCM:  Orders placed for ENB prior to RATS  Need start time at 12:15/12:30 if possible  Date: 09/06/20  Please see orders   Garner Nash, DO [facility] 08/23/2020 12:26 PM

## 2020-08-23 NOTE — Telephone Encounter (Signed)
I called the patient. He has agreed to move forward with ENB+RATS combination case.   I called Levonne Spiller from [place]. She will reach out to Dr. Kipp Brood. Looking at potentially March 2nd for combo case.    Once confirmed we will make arrangements. Alternate date would be March 7th for me (7:30AM start)   [facility] 08/23/2020 11:07 AM

## 2020-08-28 ENCOUNTER — Other Ambulatory Visit (HOSPITAL_COMMUNITY): Payer: BC Managed Care – PPO

## 2020-08-29 ENCOUNTER — Encounter: Payer: Self-pay | Admitting: Family Medicine

## 2020-08-29 ENCOUNTER — Ambulatory Visit (INDEPENDENT_AMBULATORY_CARE_PROVIDER_SITE_OTHER): Payer: BC Managed Care – PPO | Admitting: Family Medicine

## 2020-08-29 ENCOUNTER — Other Ambulatory Visit: Payer: Self-pay

## 2020-08-29 VITALS — BP 113/72 | HR 64 | Temp 97.9°F | Ht 74.0 in | Wt 245.8 lb

## 2020-08-29 DIAGNOSIS — R911 Solitary pulmonary nodule: Secondary | ICD-10-CM | POA: Diagnosis not present

## 2020-08-29 DIAGNOSIS — Z1211 Encounter for screening for malignant neoplasm of colon: Secondary | ICD-10-CM

## 2020-08-29 DIAGNOSIS — N281 Cyst of kidney, acquired: Secondary | ICD-10-CM

## 2020-08-29 DIAGNOSIS — E119 Type 2 diabetes mellitus without complications: Secondary | ICD-10-CM

## 2020-08-29 DIAGNOSIS — E1169 Type 2 diabetes mellitus with other specified complication: Secondary | ICD-10-CM | POA: Diagnosis not present

## 2020-08-29 DIAGNOSIS — E785 Hyperlipidemia, unspecified: Secondary | ICD-10-CM

## 2020-08-29 NOTE — Assessment & Plan Note (Signed)
Following with pulmonology and cardiothoracic surgery.  Has biopsy coming up in the next couple of weeks.

## 2020-08-29 NOTE — Assessment & Plan Note (Signed)
Is currently following with a specialist for this.  He is on Farxiga 10 mg daily, Metformin 1000 mg daily and Trulicity 1.5 mg weekly.  He is doing well with this.  He will follow-up with him in a couple of weeks.  Told patient he can come back to see me in 3 to 6 months for ongoing diabetes management.

## 2020-08-29 NOTE — Patient Instructions (Signed)
It was very nice to see you today!  I will place a referral for colonoscopy and we will also check an ultrasound of your kidney.  I will see you back in about 6 months or so to discuss diabetes management.  Please come back to see me sooner if needed.  Take care, Dr Jerline Pain  Please try these tips to maintain a healthy lifestyle:   Eat at least 3 REAL meals and 1-2 snacks per day.  Aim for no more than 5 hours between eating.  If you eat breakfast, please do so within one hour of getting up.    Each meal should contain half fruits/vegetables, one quarter protein, and one quarter carbs (no bigger than a computer mouse)   Cut down on sweet beverages. This includes juice, soda, and sweet tea.     Drink at least 1 glass of water with each meal and aim for at least 8 glasses per day   Exercise at least 150 minutes every week.

## 2020-08-29 NOTE — Progress Notes (Signed)
Preston Gamble is a 60 y.o. male who presents today for an office visit.  Assessment/Plan:  New/Acute Problems: Renal Cyst Incidentally found on PET scan.  Unclear if simple or complex.  Will check ultrasound to further evaluate.  Chronic Problems Addressed Today: Dyslipidemia associated with type 2 diabetes mellitus (HCC) Continue Lipitor 80 mg daily per cardiology.  Diabetes mellitus without complication ([place]) Is currently following with a specialist for this.  He is on Farxiga 10 mg daily, Metformin 1000 mg daily and Trulicity 1.5 mg weekly.  He is doing well with this.  He will follow-up with him in a couple of weeks.  Told patient he can come back to see me in 3 to 6 months for ongoing diabetes management.  Lung nodule Following with pulmonology and cardiothoracic surgery.  Has biopsy coming up in the next couple of weeks.  [facility] Will place referral for colonoscopy.     Subjective:  HPI:  Patient is here to establish care.  See A/P for status of chronic conditions.  He was recently found to have incidental lung nodule on cardiac screening CT.  Has followed up with pulmonology.  Concern for possible early lung cancer.  He will be undergoing biopsy with possible wedge resection or lobectomy in the next few weeks.  PMH:  The following were reviewed and entered/updated in epic: Past Medical History:  Diagnosis Date  . Chest pain    due to injury  . Diabetes mellitus without complication ([place])   . Dilated aortic root ([place]) 05/27/2015   52mm by Chest CTA 2021  . Family history of early CAD 05/17/2016  . Hyperlipidemia   . Mitral valve prolapse    with mild MR  . Umbilical hernia    Patient Active Problem List   Diagnosis Date Noted  . Lung nodule 07/25/2020  . Family history of early CAD 05/17/2016  . MVP (mitral valve prolapse) 05/16/2016  . Dilated aortic root ([place]) 05/27/2015  . Diabetes mellitus without complication ([place])   . Dyslipidemia  associated with type 2 diabetes mellitus [facility])    Past Surgical History:  Procedure Laterality Date  . PLANTAR FASCIA RELEASE    . SEPTOPLASTY  1994  . UMBILICAL HERNIA REPAIR  07/09/2012   Procedure: HERNIA REPAIR UMBILICAL ADULT;  Surgeon: Haywood Lasso, MD;  Location: [facility];  Service: General;  Laterality: N/A;  repair umbilical hernia    Family History  Problem Relation Age of Onset  . CAD Father   . Diabetes Father   . Hyperlipidemia Sister   . Diabetes Sister   . Diabetes Maternal Grandmother   . Diabetes Maternal Grandfather   . Diabetes Paternal Grandmother   . Diabetes Paternal Grandfather   . Colon cancer Neg Hx   . Pancreatic cancer Neg Hx   . Stomach cancer Neg Hx     Medications- reviewed and updated Current Outpatient Medications  Medication Sig Dispense Refill  . aspirin EC 81 MG tablet Take 81 mg by mouth daily.    Marland Kitchen atorvastatin (LIPITOR) 80 MG tablet TAKE 1 TABLET(80 MG) BY MOUTH DAILY (Patient taking differently: Take 80 mg by mouth daily.) 90 tablet 3  . Cholecalciferol (D3-1000) 25 MCG (1000 UT) capsule Take 1,000 Units by mouth daily.    Marland Kitchen FARXIGA 10 MG TABS tablet Take 10 mg by mouth daily.  7  . metFORMIN (GLUCOPHAGE) 1000 MG tablet Take 1,000 mg by mouth daily with breakfast.  5  . TRULICITY 1.5  MG/0.5ML SOPN Inject 1.5 mg into the skin once a week.     No current facility-administered medications for this visit.    Allergies-reviewed and updated No Known Allergies  Social History   Socioeconomic History  . Marital status: Married    Spouse name: Not on file  . Number of children: Not on file  . Years of education: Not on file  . Highest education level: Not on file  Occupational History  . Not on file  Tobacco Use  . Smoking status: Former Smoker    Quit date: 05/20/1997    Years since quitting: 23.2  . Smokeless tobacco: Never Used  Substance and Sexual Activity  . Alcohol use: Yes    Alcohol/week: 3.0  standard drinks    Types: 3 Cans of beer per week    Comment: occasional  . Drug use: No  . Sexual activity: Not on file  Other Topics Concern  . Not on file  Social History Narrative  . Not on file   Social Determinants of Health   Financial Resource Strain: Not on file  Food Insecurity: Not on file  Transportation Needs: Not on file  Physical Activity: Not on file  Stress: Not on file  Social Connections: Not on file          Objective:  Physical Exam: BP 113/72   Pulse 64   Temp 97.9 F (36.6 C) (Temporal)   Ht 6\' 2"  (1.88 m)   Wt 245 lb 12.8 oz (111.5 kg)   SpO2 97%   BMI 31.56 kg/m   Gen: No acute distress, resting comfortably CV: Regular rate and rhythm with no murmurs appreciated Pulm: Normal work of breathing, clear to auscultation bilaterally with no crackles, wheezes, or rhonchi Neuro: Grossly normal, moves all extremities Psych: Normal affect and thought content      Caleb M. Jerline Pain, MD 08/29/2020 11:25 AM

## 2020-08-29 NOTE — Assessment & Plan Note (Signed)
Continue Lipitor 80 mg daily per cardiology.

## 2020-08-30 ENCOUNTER — Ambulatory Visit
Admission: RE | Admit: 2020-08-30 | Discharge: 2020-08-30 | Disposition: A | Discharge status: Home / Self Care | Payer: BC Managed Care – PPO | Source: Ambulatory Visit | Attending: Family Medicine | Admitting: Family Medicine

## 2020-08-30 DIAGNOSIS — N281 Cyst of kidney, acquired: Secondary | ICD-10-CM

## 2020-08-31 ENCOUNTER — Inpatient Hospital Stay: Admission: RE | Admit: 2020-08-31 | Payer: BC Managed Care – PPO | Source: Ambulatory Visit

## 2020-09-01 ENCOUNTER — Ambulatory Visit (INDEPENDENT_AMBULATORY_CARE_PROVIDER_SITE_OTHER)
Admission: RE | Admit: 2020-09-01 | Discharge: 2020-09-01 | Disposition: A | Discharge status: Home / Self Care | Payer: BC Managed Care – PPO | Source: Ambulatory Visit | Attending: Pulmonary Disease | Admitting: Pulmonary Disease

## 2020-09-01 ENCOUNTER — Other Ambulatory Visit: Payer: Self-pay

## 2020-09-01 DIAGNOSIS — D1809 Hemangioma of other sites: Secondary | ICD-10-CM | POA: Diagnosis not present

## 2020-09-01 DIAGNOSIS — I700 Atherosclerosis of aorta: Secondary | ICD-10-CM | POA: Diagnosis not present

## 2020-09-01 DIAGNOSIS — R911 Solitary pulmonary nodule: Secondary | ICD-10-CM | POA: Diagnosis not present

## 2020-09-01 DIAGNOSIS — I2510 Atherosclerotic heart disease of native coronary artery without angina pectoris: Secondary | ICD-10-CM | POA: Diagnosis not present

## 2020-09-01 NOTE — Progress Notes (Signed)
Please inform patient of the following:  Ultrasound showed benign cyst. Do not need to do any further testing at this point.  Algis Greenhouse. Jerline Pain, MD 09/01/2020 8:09 AM

## 2020-09-04 ENCOUNTER — Encounter (HOSPITAL_COMMUNITY)
Admission: RE | Admit: 2020-09-04 | Discharge: 2020-09-04 | Disposition: A | Discharge status: Home / Self Care | Payer: BC Managed Care – PPO | Source: Ambulatory Visit | Attending: Pulmonary Disease | Admitting: Pulmonary Disease

## 2020-09-04 ENCOUNTER — Other Ambulatory Visit (HOSPITAL_COMMUNITY)
Admission: RE | Admit: 2020-09-04 | Discharge: 2020-09-04 | Disposition: A | Discharge status: Home / Self Care | Payer: BC Managed Care – PPO | Source: Ambulatory Visit | Attending: Pulmonary Disease | Admitting: Pulmonary Disease

## 2020-09-04 ENCOUNTER — Other Ambulatory Visit: Payer: Self-pay

## 2020-09-04 ENCOUNTER — Ambulatory Visit (HOSPITAL_COMMUNITY)
Admission: RE | Admit: 2020-09-04 | Discharge: 2020-09-04 | Disposition: A | Discharge status: Home / Self Care | Payer: BC Managed Care – PPO | Source: Ambulatory Visit | Attending: Thoracic Surgery (Cardiothoracic Vascular Surgery) | Admitting: Thoracic Surgery (Cardiothoracic Vascular Surgery)

## 2020-09-04 ENCOUNTER — Encounter (HOSPITAL_COMMUNITY): Payer: Self-pay

## 2020-09-04 DIAGNOSIS — Z87891 Personal history of nicotine dependence: Secondary | ICD-10-CM | POA: Diagnosis not present

## 2020-09-04 DIAGNOSIS — Z83438 Family history of other disorder of lipoprotein metabolism and other lipidemia: Secondary | ICD-10-CM | POA: Diagnosis not present

## 2020-09-04 DIAGNOSIS — Z7984 Long term (current) use of oral hypoglycemic drugs: Secondary | ICD-10-CM | POA: Diagnosis not present

## 2020-09-04 DIAGNOSIS — Z7982 Long term (current) use of aspirin: Secondary | ICD-10-CM | POA: Diagnosis not present

## 2020-09-04 DIAGNOSIS — Z8249 Family history of ischemic heart disease and other diseases of the circulatory system: Secondary | ICD-10-CM | POA: Diagnosis not present

## 2020-09-04 DIAGNOSIS — R918 Other nonspecific abnormal finding of lung field: Secondary | ICD-10-CM | POA: Diagnosis not present

## 2020-09-04 DIAGNOSIS — R911 Solitary pulmonary nodule: Secondary | ICD-10-CM | POA: Diagnosis not present

## 2020-09-04 DIAGNOSIS — E1169 Type 2 diabetes mellitus with other specified complication: Secondary | ICD-10-CM | POA: Diagnosis not present

## 2020-09-04 DIAGNOSIS — Z01818 Encounter for other preprocedural examination: Secondary | ICD-10-CM | POA: Insufficient documentation

## 2020-09-04 DIAGNOSIS — I341 Nonrheumatic mitral (valve) prolapse: Secondary | ICD-10-CM | POA: Diagnosis not present

## 2020-09-04 DIAGNOSIS — Z833 Family history of diabetes mellitus: Secondary | ICD-10-CM | POA: Diagnosis not present

## 2020-09-04 DIAGNOSIS — J90 Pleural effusion, not elsewhere classified: Secondary | ICD-10-CM | POA: Diagnosis not present

## 2020-09-04 DIAGNOSIS — E785 Hyperlipidemia, unspecified: Secondary | ICD-10-CM | POA: Diagnosis not present

## 2020-09-04 DIAGNOSIS — Z20822 Contact with and (suspected) exposure to covid-19: Secondary | ICD-10-CM | POA: Insufficient documentation

## 2020-09-04 DIAGNOSIS — E11649 Type 2 diabetes mellitus with hypoglycemia without coma: Secondary | ICD-10-CM | POA: Diagnosis not present

## 2020-09-04 DIAGNOSIS — Z79899 Other long term (current) drug therapy: Secondary | ICD-10-CM | POA: Diagnosis not present

## 2020-09-04 DIAGNOSIS — I340 Nonrheumatic mitral (valve) insufficiency: Secondary | ICD-10-CM | POA: Diagnosis not present

## 2020-09-04 DIAGNOSIS — K429 Umbilical hernia without obstruction or gangrene: Secondary | ICD-10-CM | POA: Diagnosis not present

## 2020-09-04 DIAGNOSIS — I517 Cardiomegaly: Secondary | ICD-10-CM | POA: Diagnosis not present

## 2020-09-04 DIAGNOSIS — Z4682 Encounter for fitting and adjustment of non-vascular catheter: Secondary | ICD-10-CM | POA: Diagnosis not present

## 2020-09-04 DIAGNOSIS — C3412 Malignant neoplasm of upper lobe, left bronchus or lung: Secondary | ICD-10-CM | POA: Diagnosis not present

## 2020-09-04 DIAGNOSIS — J939 Pneumothorax, unspecified: Secondary | ICD-10-CM | POA: Diagnosis not present

## 2020-09-04 DIAGNOSIS — R846 Abnormal cytological findings in specimens from respiratory organs and thorax: Secondary | ICD-10-CM | POA: Diagnosis not present

## 2020-09-04 DIAGNOSIS — J9382 Other air leak: Secondary | ICD-10-CM | POA: Diagnosis not present

## 2020-09-04 LAB — SARS CORONAVIRUS 2 (TAT 6-24 HRS): SARS Coronavirus 2: NEGATIVE

## 2020-09-04 LAB — URINALYSIS, ROUTINE W REFLEX MICROSCOPIC
Bacteria, UA: NONE SEEN
Bilirubin Urine: NEGATIVE
Glucose, UA: >=500 mg/dL — AB
Hgb urine dipstick: NEGATIVE
Ketones, ur: NEGATIVE mg/dL
Leukocytes,Ua: NEGATIVE
Nitrite: NEGATIVE
Protein, ur: NEGATIVE mg/dL
Specific Gravity, Urine: 1.037 — ABNORMAL HIGH (ref 1.005–1.030)
pH: 5 (ref 5.0–8.0)

## 2020-09-04 LAB — BLOOD GAS, ARTERIAL
Acid-base deficit: 2.2 mmol/L — ABNORMAL HIGH (ref 0.0–2.0)
Bicarbonate: 21.8 mmol/L (ref 20.0–28.0)
Drawn by: 602861
FIO2: 21
O2 Saturation: 96.9 %
Patient temperature: 37
pCO2 arterial: 35.9 mmHg (ref 32.0–48.0)
pH, Arterial: 7.401 (ref 7.350–7.450)
pO2, Arterial: 87.3 mmHg (ref 83.0–108.0)

## 2020-09-04 LAB — PROTIME-INR
INR: 1 (ref 0.8–1.2)
Prothrombin Time: 12.8 seconds (ref 11.4–15.2)

## 2020-09-04 LAB — COMPREHENSIVE METABOLIC PANEL
ALT: 40 U/L (ref 0–44)
AST: 24 U/L (ref 15–41)
Albumin: 4 g/dL (ref 3.5–5.0)
Alkaline Phosphatase: 81 U/L (ref 38–126)
Anion gap: 12 (ref 5–15)
BUN: 16 mg/dL (ref 6–20)
CO2: 18 mmol/L — ABNORMAL LOW (ref 22–32)
Calcium: 9.1 mg/dL (ref 8.9–10.3)
Chloride: 106 mmol/L (ref 98–111)
Creatinine, Ser: 1.04 mg/dL (ref 0.61–1.24)
GFR, Estimated: >60 mL/min (ref >60)
Glucose, Bld: 175 mg/dL — ABNORMAL HIGH (ref 70–99)
Potassium: 4.1 mmol/L (ref 3.5–5.1)
Sodium: 136 mmol/L (ref 135–145)
Total Bilirubin: 1.8 mg/dL — ABNORMAL HIGH (ref 0.3–1.2)
Total Protein: 6.2 g/dL — ABNORMAL LOW (ref 6.5–8.1)

## 2020-09-04 LAB — TYPE AND SCREEN
ABO/RH(D): O POS
Antibody Screen: NEGATIVE

## 2020-09-04 LAB — SURGICAL PCR SCREEN
MRSA, PCR: NEGATIVE
Staphylococcus aureus: NEGATIVE

## 2020-09-04 LAB — CBC
HCT: 48.3 % (ref 39.0–52.0)
Hemoglobin: 16.5 g/dL (ref 13.0–17.0)
MCH: 28.3 pg (ref 26.0–34.0)
MCHC: 34.2 g/dL (ref 30.0–36.0)
MCV: 82.7 fL (ref 80.0–100.0)
Platelets: 233 10*3/uL (ref 150–400)
RBC: 5.84 MIL/uL — ABNORMAL HIGH (ref 4.22–5.81)
RDW: 12.8 % (ref 11.5–15.5)
WBC: 7.2 10*3/uL (ref 4.0–10.5)
nRBC: 0 % (ref 0.0–0.2)

## 2020-09-04 LAB — HEMOGLOBIN A1C
Hgb A1c MFr Bld: 8.4 % — ABNORMAL HIGH (ref 4.8–5.6)
Mean Plasma Glucose: 194.38 mg/dL

## 2020-09-04 LAB — APTT: aPTT: 30 seconds (ref 24–36)

## 2020-09-04 LAB — GLUCOSE, CAPILLARY: Glucose-Capillary: 159 mg/dL — ABNORMAL HIGH (ref 70–99)

## 2020-09-04 NOTE — Anesthesia Preprocedure Evaluation (Addendum)
Anesthesia Evaluation  Patient identified by MRN, date of birth, ID band Patient awake    Reviewed: Allergy & Precautions, NPO status , Patient's Chart, lab work & pertinent test results  Airway        Dental   Pulmonary former smoker,  Lung nodule Quit smoking 1998          Cardiovascular MVP   Echo 08/2020: 1. Left ventricular ejection fraction, by estimation, is 60 to 65%. The left ventricle has normal function. The left ventricle has no regional wall motion abnormalities. Left ventricular diastolic parameters were normal. The average left ventricular global longitudinal strain is -25.4 %.  2. Right ventricular systolic function is normal. The right ventricular size is normal.  3. The mitral valve is normal in structure. Trivial mitral valve regurgitation. No evidence of mitral stenosis.  4. The aortic valve is tricuspid. Aortic valve regurgitation is not visualized. No aortic stenosis is present.  5. Aortic dilatation noted. There is mild dilatation of the ascending  aorta, measuring 42 mm.  6. The inferior vena cava is dilated in size with >50% respiratory  variability, suggesting right atrial pressure of 8 mmHg.   Stress test 2021: Negative adequate stress test. Excellent exercise capacity. Rare PVCs with stress but did occur in couplets. Nonspecific upsloping ST changes at peak exercise and early recovery.   Neuro/Psych negative neurological ROS  negative psych ROS   GI/Hepatic negative GI ROS, Neg liver ROS,   Endo/Other  diabetes, Poorly Controlled, Type 2, Oral Hypoglycemic AgentsLast a1c 8.4 FS 166  Renal/GU negative Renal ROS  negative genitourinary   Musculoskeletal negative musculoskeletal ROS (+)   Abdominal   Peds  Hematology negative hematology ROS (+)   Anesthesia Other Findings   Reproductive/Obstetrics negative OB ROS                            Anesthesia  Physical Anesthesia Plan  ASA: II  Anesthesia Plan: General   Post-op Pain Management:    Induction: Intravenous  PONV Risk Score and Plan: 2 and Ondansetron, Dexamethasone, Midazolam and Treatment may vary due to age or medical condition  Airway Management Planned: Oral ETT  Additional Equipment: None  Intra-op Plan:   Post-operative Plan: Extubation in OR  Informed Consent: I have reviewed the patients History and Physical, chart, labs and discussed the procedure including the risks, benefits and alternatives for the proposed anesthesia with the patient or authorized representative who has indicated his/her understanding and acceptance.     Dental advisory given  Plan Discussed with: CRNA  Anesthesia Plan Comments:        Anesthesia Quick Evaluation

## 2020-09-04 NOTE — Pre-Procedure Instructions (Signed)
Surgical Instructions    Your procedure is scheduled on Wednesday, March 2nd.  Report to [facility] Main Entrance "A" at 10:15 A.M. A.M., then check in with the Admitting office.  Call this number if you have problems the morning of surgery:  [phone]   If you have any questions prior to your surgery date call [phone]: Open Monday-Friday 8am-4pm    Remember:  Do not eat or drink after midnight the night before your surgery    Take these medicines the morning of surgery with A SIP OF WATER atorvastatin (LIPITOR)  As of today, STOP taking any Aleve, Naproxen, Ibuprofen, Motrin, Advil, Goody's, BC's, all herbal medications, fish oil, and all vitamins.          WHAT DO I DO ABOUT MY DIABETES MEDICATION?   Marland Kitchen Do not take metFORMIN (GLUCOPHAGE) the morning of surgery.  . Hold FARXIGA the 09/05/20 (Tuesday) and 09/06/20 (Wednesday).      . The day of surgery, do not take other diabetes injectables, including Trulicity (dulaglutide).  HOW TO MANAGE YOUR DIABETES BEFORE AND AFTER SURGERY  Why is it important to control my blood sugar before and after surgery? . Improving blood sugar levels before and after surgery helps healing and can limit problems. . A way of improving blood sugar control is eating a healthy diet by: o  Eating less sugar and carbohydrates o  Increasing activity/exercise o  Talking with your doctor about reaching your blood sugar goals . High blood sugars (greater than 180 mg/dL) can raise your risk of infections and slow your recovery, so you will need to focus on controlling your diabetes during the weeks before surgery. . Make sure that the doctor who takes care of your diabetes knows about your planned surgery including the date and location.  How do I manage my blood sugar before surgery? . Check your blood sugar at least 4 times a day, starting 2 days before surgery, to make sure that the level is not too high or low. . Check your blood sugar the morning  of your surgery when you wake up and every 2 hours until you get to the Short Stay unit. o If your blood sugar is less than 70 mg/dL, you will need to treat for low blood sugar: - Do not take insulin. - Treat a low blood sugar (less than 70 mg/dL) with  cup of clear juice (cranberry or apple), 4 glucose tablets, OR glucose gel. - Recheck blood sugar in 15 minutes after treatment (to make sure it is greater than 70 mg/dL). If your blood sugar is not greater than 70 mg/dL on recheck, call [phone] for further instructions. . Report your blood sugar to the short stay nurse when you get to Short Stay.  . If you are admitted to the hospital after surgery: o Your blood sugar will be checked by the staff and you will probably be given insulin after surgery (instead of oral diabetes medicines) to make sure you have good blood sugar levels. o The goal for blood sugar control after surgery is 80-180 mg/dL.            Do NOT Smoke (Tobacco/Vaping) or drink Alcohol 24 hours prior to your procedure If you use a CPAP at night, you may bring all equipment for your overnight stay.   Contacts, glasses, dentures or bridgework may not be worn into surgery, please bring cases for these belongings   For patients admitted to the hospital, discharge time will be  determined by your treatment team.   Patients discharged the day of surgery will not be allowed to drive home, and someone needs to stay with them for 24 hours.    Special instructions:   [facility]- Preparing For Surgery  Before surgery, you can play an important role. Because skin is not sterile, your skin needs to be as free of germs as possible. You can reduce the number of germs on your skin by washing with CHG (chlorahexidine gluconate) Soap before surgery.  CHG is an antiseptic cleaner which kills germs and bonds with the skin to continue killing germs even after washing.    Oral Hygiene is also important to reduce your risk of infection.   Remember - BRUSH YOUR TEETH THE MORNING OF SURGERY WITH YOUR REGULAR TOOTHPASTE  Please do not use if you have an allergy to CHG or antibacterial soaps. If your skin becomes reddened/irritated stop using the CHG.  Do not shave (including legs and underarms) for at least 48 hours prior to first CHG shower. It is OK to shave your face.  Please follow these instructions carefully.   1. Shower the NIGHT BEFORE SURGERY and the MORNING OF SURGERY  2. If you chose to wash your hair, wash your hair first as usual with your normal shampoo.  3. After you shampoo, rinse your hair and body thoroughly to remove the shampoo.  4. Wash Face and genitals (private parts) with your normal soap.   5.  Shower the NIGHT BEFORE SURGERY and the MORNING OF SURGERY with CHG Soap.   6. Use CHG Soap as you would any other liquid soap. You can apply CHG directly to the skin and wash gently with a scrungie or a clean washcloth.   7. Apply the CHG Soap to your body ONLY FROM THE NECK DOWN.  Do not use on open wounds or open sores. Avoid contact with your eyes, ears, mouth and genitals (private parts). Wash Face and genitals (private parts)  with your normal soap.   8. Wash thoroughly, paying special attention to the area where your surgery will be performed.  9. Thoroughly rinse your body with warm water from the neck down.  10. DO NOT shower/wash with your normal soap after using and rinsing off the CHG Soap.  11. Pat yourself dry with a CLEAN TOWEL.  12. Wear CLEAN PAJAMAS to bed the night before surgery  13. Place CLEAN SHEETS on your bed the night before your surgery  14. DO NOT SLEEP WITH PETS.   Day of Surgery: Shower with CHG soap. Wear Clean/Comfortable clothing the morning of surgery Do not apply any deodorants/lotions.   Remember to brush your teeth WITH YOUR REGULAR TOOTHPASTE.     Do not wear jewelry.             Do not wear lotions, powders, perfumes, or deodorant.             Do not shave  48 hours prior to surgery.               Do not bring valuables to the hospital.             [facility] is not responsible for any belongings or valuables. Please read over the following fact sheets that you were given.

## 2020-09-04 NOTE — Progress Notes (Signed)
PCP - Dr. Dimas Chyle Cardiologist - Dr. Ashok Norris  Chest x-ray - 09/04/20 EKG - 09/04/20 Stress Test - 06/20/20 ECHO - 08/10/20 Cardiac Cath -denies   Sleep Study - denies CPAP - denies  Pt does not check CBG at home. Pt does has the meter to do so. CBG at PAT 150. Will collect A1C today.  Blood Thinner Instructions: n/a Aspirin Instructions:cont, none DOS.  COVID TEST- 09/04/20   Anesthesia review: Yes   Patient denies shortness of breath, fever, cough and chest pain at PAT appointment   All instructions explained to the patient, with a verbal understanding of the material. Patient agrees to go over the instructions while at home for a better understanding. Patient also instructed to self quarantine after being tested for COVID-19. The opportunity to ask questions was provided.

## 2020-09-06 ENCOUNTER — Encounter (HOSPITAL_COMMUNITY)
Admission: RE | Disposition: A | Discharge status: Home / Self Care | Payer: Self-pay | Source: Home / Self Care | Attending: Thoracic Surgery (Cardiothoracic Vascular Surgery)

## 2020-09-06 ENCOUNTER — Encounter (HOSPITAL_COMMUNITY): Payer: Self-pay | Admitting: Pulmonary Disease

## 2020-09-06 ENCOUNTER — Ambulatory Visit (HOSPITAL_COMMUNITY): Payer: BC Managed Care – PPO | Admitting: Anesthesiology

## 2020-09-06 ENCOUNTER — Inpatient Hospital Stay (HOSPITAL_COMMUNITY)
Admission: RE | Admit: 2020-09-06 | Discharge: 2020-09-07 | DRG: 164 | Disposition: A | Discharge status: Home / Self Care | Payer: BC Managed Care – PPO | Attending: Thoracic Surgery (Cardiothoracic Vascular Surgery) | Admitting: Thoracic Surgery (Cardiothoracic Vascular Surgery)

## 2020-09-06 ENCOUNTER — Inpatient Hospital Stay (HOSPITAL_COMMUNITY): Payer: BC Managed Care – PPO

## 2020-09-06 ENCOUNTER — Ambulatory Visit (HOSPITAL_COMMUNITY): Payer: BC Managed Care – PPO | Admitting: Physician Assistant

## 2020-09-06 ENCOUNTER — Ambulatory Visit (HOSPITAL_COMMUNITY): Payer: BC Managed Care – PPO

## 2020-09-06 ENCOUNTER — Other Ambulatory Visit: Payer: Self-pay

## 2020-09-06 DIAGNOSIS — K429 Umbilical hernia without obstruction or gangrene: Secondary | ICD-10-CM | POA: Diagnosis present

## 2020-09-06 DIAGNOSIS — Z79899 Other long term (current) drug therapy: Secondary | ICD-10-CM

## 2020-09-06 DIAGNOSIS — Z9889 Other specified postprocedural states: Secondary | ICD-10-CM

## 2020-09-06 DIAGNOSIS — Z8249 Family history of ischemic heart disease and other diseases of the circulatory system: Secondary | ICD-10-CM | POA: Diagnosis not present

## 2020-09-06 DIAGNOSIS — Z7982 Long term (current) use of aspirin: Secondary | ICD-10-CM

## 2020-09-06 DIAGNOSIS — E11649 Type 2 diabetes mellitus with hypoglycemia without coma: Secondary | ICD-10-CM | POA: Diagnosis present

## 2020-09-06 DIAGNOSIS — Z7984 Long term (current) use of oral hypoglycemic drugs: Secondary | ICD-10-CM | POA: Diagnosis not present

## 2020-09-06 DIAGNOSIS — C3490 Malignant neoplasm of unspecified part of unspecified bronchus or lung: Secondary | ICD-10-CM | POA: Insufficient documentation

## 2020-09-06 DIAGNOSIS — Z87891 Personal history of nicotine dependence: Secondary | ICD-10-CM

## 2020-09-06 DIAGNOSIS — Z83438 Family history of other disorder of lipoprotein metabolism and other lipidemia: Secondary | ICD-10-CM

## 2020-09-06 DIAGNOSIS — C3412 Malignant neoplasm of upper lobe, left bronchus or lung: Secondary | ICD-10-CM | POA: Diagnosis present

## 2020-09-06 DIAGNOSIS — Z833 Family history of diabetes mellitus: Secondary | ICD-10-CM

## 2020-09-06 DIAGNOSIS — I341 Nonrheumatic mitral (valve) prolapse: Secondary | ICD-10-CM | POA: Diagnosis present

## 2020-09-06 DIAGNOSIS — J9382 Other air leak: Secondary | ICD-10-CM | POA: Diagnosis not present

## 2020-09-06 DIAGNOSIS — R911 Solitary pulmonary nodule: Secondary | ICD-10-CM | POA: Diagnosis not present

## 2020-09-06 DIAGNOSIS — J90 Pleural effusion, not elsewhere classified: Secondary | ICD-10-CM

## 2020-09-06 DIAGNOSIS — Z20822 Contact with and (suspected) exposure to covid-19: Secondary | ICD-10-CM | POA: Diagnosis present

## 2020-09-06 DIAGNOSIS — E785 Hyperlipidemia, unspecified: Secondary | ICD-10-CM | POA: Diagnosis present

## 2020-09-06 DIAGNOSIS — I340 Nonrheumatic mitral (valve) insufficiency: Secondary | ICD-10-CM | POA: Diagnosis present

## 2020-09-06 DIAGNOSIS — Z902 Acquired absence of lung [part of]: Secondary | ICD-10-CM

## 2020-09-06 DIAGNOSIS — E1169 Type 2 diabetes mellitus with other specified complication: Secondary | ICD-10-CM | POA: Diagnosis present

## 2020-09-06 DIAGNOSIS — J939 Pneumothorax, unspecified: Secondary | ICD-10-CM

## 2020-09-06 HISTORY — PX: VIDEO BRONCHOSCOPY WITH ENDOBRONCHIAL NAVIGATION: SHX6175

## 2020-09-06 HISTORY — PX: BRONCHIAL BRUSHINGS: SHX5108

## 2020-09-06 HISTORY — PX: BRONCHIAL BIOPSY: SHX5109

## 2020-09-06 HISTORY — PX: INTERCOSTAL NERVE BLOCK: SHX5021

## 2020-09-06 HISTORY — PX: SCLEROTHERAPY: SHX6841

## 2020-09-06 HISTORY — PX: BRONCHIAL NEEDLE ASPIRATION BIOPSY: SHX5106

## 2020-09-06 LAB — GLUCOSE, CAPILLARY
Glucose-Capillary: 121 mg/dL — ABNORMAL HIGH (ref 70–99)
Glucose-Capillary: 166 mg/dL — ABNORMAL HIGH (ref 70–99)
Glucose-Capillary: 168 mg/dL — ABNORMAL HIGH (ref 70–99)
Glucose-Capillary: 201 mg/dL — ABNORMAL HIGH (ref 70–99)

## 2020-09-06 SURGERY — VIDEO BRONCHOSCOPY WITH ENDOBRONCHIAL NAVIGATION
Anesthesia: General | Laterality: Left

## 2020-09-06 SURGERY — WEDGE RESECTION, LUNG, ROBOT-ASSISTED, THORACOSCOPIC
Anesthesia: General | Site: Chest | Laterality: Left

## 2020-09-06 MED ORDER — 0.9 % SODIUM CHLORIDE (POUR BTL) OPTIME
TOPICAL | Status: DC | PRN
  Administered 2020-09-06: 2000 mL

## 2020-09-06 MED ORDER — BUPIVACAINE LIPOSOME 1.3 % IJ SUSP
20.0000 mL | INTRAMUSCULAR | Status: DC
  Filled 2020-09-06: qty 20

## 2020-09-06 MED ORDER — FENTANYL CITRATE (PF) 250 MCG/5ML IJ SOLN
INTRAMUSCULAR | Status: DC | PRN
  Administered 2020-09-06 (×2): 100 ug via INTRAVENOUS
  Administered 2020-09-06: 50 ug via INTRAVENOUS

## 2020-09-06 MED ORDER — PROPOFOL 500 MG/50ML IV EMUL
INTRAVENOUS | Status: DC | PRN
  Administered 2020-09-06: 75 ug/kg/min via INTRAVENOUS

## 2020-09-06 MED ORDER — ACETAMINOPHEN 500 MG PO TABS
1000.0000 mg | ORAL_TABLET | Freq: Four times a day (QID) | ORAL | Status: DC
  Administered 2020-09-07 (×3): 1000 mg via ORAL
  Filled 2020-09-06 (×3): qty 2

## 2020-09-06 MED ORDER — CEFAZOLIN SODIUM-DEXTROSE 2-4 GM/100ML-% IV SOLN
INTRAVENOUS | Status: AC
  Filled 2020-09-06: qty 100

## 2020-09-06 MED ORDER — OXYCODONE HCL 5 MG PO TABS: 5.0000 mg | ORAL_TABLET | Freq: Once | ORAL | Status: DC | PRN

## 2020-09-06 MED ORDER — HYDROMORPHONE HCL 1 MG/ML IJ SOLN
INTRAMUSCULAR | Status: AC
  Filled 2020-09-06: qty 1

## 2020-09-06 MED ORDER — MORPHINE SULFATE (PF) 2 MG/ML IV SOLN: 1.0000 mg | INTRAVENOUS | Status: DC | PRN

## 2020-09-06 MED ORDER — ATORVASTATIN CALCIUM 80 MG PO TABS
80.0000 mg | ORAL_TABLET | Freq: Every day | ORAL | Status: DC
  Administered 2020-09-07: 80 mg via ORAL
  Filled 2020-09-06: qty 1

## 2020-09-06 MED ORDER — LIDOCAINE 2% (20 MG/ML) 5 ML SYRINGE
INTRAMUSCULAR | Status: AC
  Filled 2020-09-06: qty 10

## 2020-09-06 MED ORDER — MIDAZOLAM HCL 2 MG/2ML IJ SOLN
INTRAMUSCULAR | Status: DC | PRN
  Administered 2020-09-06: 2 mg via INTRAVENOUS

## 2020-09-06 MED ORDER — ASPIRIN EC 81 MG PO TBEC
81.0000 mg | DELAYED_RELEASE_TABLET | Freq: Every day | ORAL | Status: DC
  Administered 2020-09-07: 81 mg via ORAL
  Filled 2020-09-06: qty 1

## 2020-09-06 MED ORDER — MIDAZOLAM HCL 2 MG/2ML IJ SOLN
INTRAMUSCULAR | Status: AC
  Filled 2020-09-06: qty 2

## 2020-09-06 MED ORDER — ROCURONIUM BROMIDE 10 MG/ML (PF) SYRINGE
PREFILLED_SYRINGE | INTRAVENOUS | Status: AC
  Filled 2020-09-06: qty 40

## 2020-09-06 MED ORDER — PROMETHAZINE HCL 25 MG/ML IJ SOLN
6.2500 mg | INTRAMUSCULAR | Status: DC | PRN
Start: 2020-09-06 — End: 2020-09-06

## 2020-09-06 MED ORDER — ACETAMINOPHEN 160 MG/5ML PO SOLN: 1000.0000 mg | Freq: Four times a day (QID) | ORAL | Status: DC

## 2020-09-06 MED ORDER — ENOXAPARIN SODIUM 40 MG/0.4ML ~~LOC~~ SOLN: 40.0000 mg | SUBCUTANEOUS | Status: DC

## 2020-09-06 MED ORDER — SENNOSIDES-DOCUSATE SODIUM 8.6-50 MG PO TABS
1.0000 | ORAL_TABLET | Freq: Every day | ORAL | Status: DC
  Administered 2020-09-07: 1 via ORAL
  Filled 2020-09-06: qty 1

## 2020-09-06 MED ORDER — ONDANSETRON HCL 4 MG/2ML IJ SOLN
INTRAMUSCULAR | Status: AC
  Filled 2020-09-06: qty 2

## 2020-09-06 MED ORDER — HYDROMORPHONE HCL 1 MG/ML IJ SOLN
INTRAMUSCULAR | Status: AC
  Filled 2020-09-06: qty 0.5

## 2020-09-06 MED ORDER — KETOROLAC TROMETHAMINE 30 MG/ML IJ SOLN
30.0000 mg | Freq: Four times a day (QID) | INTRAMUSCULAR | Status: DC
  Administered 2020-09-07 (×3): 30 mg via INTRAVENOUS
  Filled 2020-09-06 (×3): qty 1

## 2020-09-06 MED ORDER — ONDANSETRON HCL 4 MG/2ML IJ SOLN: 4.0000 mg | Freq: Four times a day (QID) | INTRAMUSCULAR | Status: DC | PRN

## 2020-09-06 MED ORDER — HYDROMORPHONE HCL 1 MG/ML IJ SOLN
INTRAMUSCULAR | Status: DC | PRN
  Administered 2020-09-06 (×2): .5 mg via INTRAVENOUS

## 2020-09-06 MED ORDER — SUCCINYLCHOLINE CHLORIDE 200 MG/10ML IV SOSY
PREFILLED_SYRINGE | INTRAVENOUS | Status: AC
  Filled 2020-09-06: qty 10

## 2020-09-06 MED ORDER — METHYLENE BLUE 0.5 % INJ SOLN
INTRAVENOUS | Status: DC | PRN
  Administered 2020-09-06: 2.5 mL

## 2020-09-06 MED ORDER — FENTANYL CITRATE (PF) 250 MCG/5ML IJ SOLN
INTRAMUSCULAR | Status: AC
  Filled 2020-09-06: qty 5

## 2020-09-06 MED ORDER — CEFAZOLIN SODIUM-DEXTROSE 2-4 GM/100ML-% IV SOLN
2.0000 g | INTRAVENOUS | Status: AC
  Administered 2020-09-06: 2 g via INTRAVENOUS

## 2020-09-06 MED ORDER — PHENYLEPHRINE HCL (PRESSORS) 10 MG/ML IV SOLN
INTRAVENOUS | Status: DC | PRN
  Administered 2020-09-06: 40 ug via INTRAVENOUS

## 2020-09-06 MED ORDER — PROPOFOL 10 MG/ML IV BOLUS
INTRAVENOUS | Status: DC | PRN
  Administered 2020-09-06: 200 mg via INTRAVENOUS

## 2020-09-06 MED ORDER — SODIUM CHLORIDE FLUSH 0.9 % IV SOLN
INTRAVENOUS | Status: DC | PRN
  Administered 2020-09-06: 100 mL

## 2020-09-06 MED ORDER — BUPIVACAINE HCL (PF) 0.5 % IJ SOLN
INTRAMUSCULAR | Status: AC
  Filled 2020-09-06: qty 30

## 2020-09-06 MED ORDER — ONDANSETRON HCL 4 MG/2ML IJ SOLN
INTRAMUSCULAR | Status: AC
  Filled 2020-09-06: qty 4

## 2020-09-06 MED ORDER — LACTATED RINGERS IV SOLN: INTRAVENOUS | Status: DC

## 2020-09-06 MED ORDER — ROCURONIUM BROMIDE 10 MG/ML (PF) SYRINGE
PREFILLED_SYRINGE | INTRAVENOUS | Status: DC | PRN
  Administered 2020-09-06: 40 mg via INTRAVENOUS
  Administered 2020-09-06: 20 mg via INTRAVENOUS
  Administered 2020-09-06: 60 mg via INTRAVENOUS
  Administered 2020-09-06: 20 mg via INTRAVENOUS
  Administered 2020-09-06: 50 mg via INTRAVENOUS

## 2020-09-06 MED ORDER — CHLORHEXIDINE GLUCONATE 0.12 % MT SOLN
OROMUCOSAL | Status: AC
  Administered 2020-09-06: 15 mL via OROMUCOSAL
  Filled 2020-09-06: qty 15

## 2020-09-06 MED ORDER — TRAMADOL HCL 50 MG PO TABS
50.0000 mg | ORAL_TABLET | Freq: Four times a day (QID) | ORAL | Status: DC | PRN
  Administered 2020-09-07: 100 mg via ORAL
  Filled 2020-09-06: qty 2

## 2020-09-06 MED ORDER — DEXAMETHASONE SODIUM PHOSPHATE 10 MG/ML IJ SOLN
INTRAMUSCULAR | Status: AC
  Filled 2020-09-06: qty 2

## 2020-09-06 MED ORDER — HYDROMORPHONE HCL 1 MG/ML IJ SOLN: 0.5000 mg | INTRAMUSCULAR | Status: DC | PRN

## 2020-09-06 MED ORDER — INSULIN ASPART 100 UNIT/ML ~~LOC~~ SOLN
0.0000 [IU] | Freq: Four times a day (QID) | SUBCUTANEOUS | Status: DC
  Administered 2020-09-07: 4 [IU] via SUBCUTANEOUS

## 2020-09-06 MED ORDER — PHENYLEPHRINE 40 MCG/ML (10ML) SYRINGE FOR IV PUSH (FOR BLOOD PRESSURE SUPPORT)
PREFILLED_SYRINGE | INTRAVENOUS | Status: AC
  Filled 2020-09-06: qty 10

## 2020-09-06 MED ORDER — KETOROLAC TROMETHAMINE 30 MG/ML IJ SOLN
INTRAMUSCULAR | Status: DC | PRN
  Administered 2020-09-06: 30 mg via INTRAVENOUS

## 2020-09-06 MED ORDER — HEMOSTATIC AGENTS (NO CHARGE) OPTIME
TOPICAL | Status: DC | PRN
  Administered 2020-09-06: 1 via TOPICAL

## 2020-09-06 MED ORDER — OXYCODONE HCL 5 MG/5ML PO SOLN: 5.0000 mg | Freq: Once | ORAL | Status: DC | PRN

## 2020-09-06 MED ORDER — LACTATED RINGERS IV SOLN: INTRAVENOUS | Status: DC | PRN

## 2020-09-06 MED ORDER — ACETAMINOPHEN 10 MG/ML IV SOLN
1000.0000 mg | Freq: Once | INTRAVENOUS | Status: DC | PRN
  Administered 2020-09-06: 1000 mg via INTRAVENOUS

## 2020-09-06 MED ORDER — LIDOCAINE 2% (20 MG/ML) 5 ML SYRINGE
INTRAMUSCULAR | Status: DC | PRN
  Administered 2020-09-06: 100 mg via INTRAVENOUS

## 2020-09-06 MED ORDER — DEXAMETHASONE SODIUM PHOSPHATE 10 MG/ML IJ SOLN
INTRAMUSCULAR | Status: DC | PRN
  Administered 2020-09-06: 10 mg via INTRAVENOUS

## 2020-09-06 MED ORDER — CHLORHEXIDINE GLUCONATE 0.12 % MT SOLN: 15.0000 mL | Freq: Once | OROMUCOSAL | Status: AC

## 2020-09-06 MED ORDER — EPHEDRINE 5 MG/ML INJ
INTRAVENOUS | Status: AC
  Filled 2020-09-06: qty 10

## 2020-09-06 MED ORDER — ACETAMINOPHEN 10 MG/ML IV SOLN
INTRAVENOUS | Status: AC
  Filled 2020-09-06: qty 100

## 2020-09-06 MED ORDER — CEFAZOLIN SODIUM-DEXTROSE 2-4 GM/100ML-% IV SOLN
2.0000 g | Freq: Three times a day (TID) | INTRAVENOUS | Status: AC
  Administered 2020-09-07 (×2): 2 g via INTRAVENOUS
  Filled 2020-09-06 (×2): qty 100

## 2020-09-06 MED ORDER — SUGAMMADEX SODIUM 200 MG/2ML IV SOLN
INTRAVENOUS | Status: DC | PRN
  Administered 2020-09-06: 200 mg via INTRAVENOUS

## 2020-09-06 MED ORDER — ONDANSETRON HCL 4 MG/2ML IJ SOLN
INTRAMUSCULAR | Status: DC | PRN
  Administered 2020-09-06: 4 mg via INTRAVENOUS

## 2020-09-06 MED ORDER — BISACODYL 5 MG PO TBEC
10.0000 mg | DELAYED_RELEASE_TABLET | Freq: Every day | ORAL | Status: DC
  Administered 2020-09-07: 10 mg via ORAL
  Filled 2020-09-06: qty 2

## 2020-09-06 MED ORDER — HYDROMORPHONE HCL 1 MG/ML IJ SOLN
0.2500 mg | INTRAMUSCULAR | Status: DC | PRN
  Administered 2020-09-06 (×5): 0.5 mg via INTRAVENOUS

## 2020-09-06 MED ORDER — ORAL CARE MOUTH RINSE: 15.0000 mL | Freq: Once | OROMUCOSAL | Status: AC

## 2020-09-06 SURGICAL SUPPLY — 119 items
ADH SKN CLS APL DERMABOND .7 (GAUZE/BANDAGES/DRESSINGS) ×2
APL PRP STRL LF DISP 70% ISPRP (MISCELLANEOUS) ×2
BAG SPEC RTRVL C125 8X14 (MISCELLANEOUS) ×2
BAG SPEC RTRVL C1550 15 (MISCELLANEOUS) ×2
BLADE CLIPPER SURG (BLADE) ×2 IMPLANT
BNDG COHESIVE 6X5 TAN STRL LF (GAUZE/BANDAGES/DRESSINGS) ×2 IMPLANT
CANISTER SUCT 3000ML PPV (MISCELLANEOUS) ×5 IMPLANT
CANNULA REDUC XI 12-8 STAPL (CANNULA) ×6
CANNULA REDUCER 12-8 DVNC XI (CANNULA) ×4 IMPLANT
CATH THORACIC 28FR (CATHETERS) ×1 IMPLANT
CATH THORACIC 28FR RT ANG (CATHETERS) IMPLANT
CATH THORACIC 36FR (CATHETERS) IMPLANT
CATH THORACIC 36FR RT ANG (CATHETERS) IMPLANT
CATH TROCAR 20FR (CATHETERS) IMPLANT
CHLORAPREP W/TINT 26 (MISCELLANEOUS) ×3 IMPLANT
CLIP VESOCCLUDE MED 6/CT (CLIP) IMPLANT
CNTNR URN SCR LID CUP LEK RST (MISCELLANEOUS) ×4 IMPLANT
CONN ST 1/4X3/8  BEN (MISCELLANEOUS)
CONN ST 1/4X3/8 BEN (MISCELLANEOUS) IMPLANT
CONT SPEC 4OZ STRL OR WHT (MISCELLANEOUS) ×21
COVER SURGICAL LIGHT HANDLE (MISCELLANEOUS) IMPLANT
DEFOGGER SCOPE WARMER CLEARIFY (MISCELLANEOUS) ×3 IMPLANT
DERMABOND ADVANCED (GAUZE/BANDAGES/DRESSINGS) ×1
DERMABOND ADVANCED .7 DNX12 (GAUZE/BANDAGES/DRESSINGS) ×2 IMPLANT
DRAIN CHANNEL 28F RND 3/8 FF (WOUND CARE) IMPLANT
DRAIN CHANNEL 32F RND 10.7 FF (WOUND CARE) IMPLANT
DRAPE ARM DVNC X/XI (DISPOSABLE) ×8 IMPLANT
DRAPE COLUMN DVNC XI (DISPOSABLE) ×2 IMPLANT
DRAPE CV SPLIT W-CLR ANES SCRN (DRAPES) ×3 IMPLANT
DRAPE DA VINCI XI ARM (DISPOSABLE) ×12
DRAPE DA VINCI XI COLUMN (DISPOSABLE) ×3
DRAPE ORTHO SPLIT 77X108 STRL (DRAPES) ×3
DRAPE SURG ORHT 6 SPLT 77X108 (DRAPES) ×2 IMPLANT
ELECT BLADE 6.5 EXT (BLADE) IMPLANT
ELECT REM PT RETURN 9FT ADLT (ELECTROSURGICAL) ×3
ELECTRODE REM PT RTRN 9FT ADLT (ELECTROSURGICAL) ×2 IMPLANT
GAUZE KITTNER 4X5 RF (MISCELLANEOUS) ×3 IMPLANT
GAUZE SPONGE 4X4 12PLY STRL (GAUZE/BANDAGES/DRESSINGS) ×3 IMPLANT
GLOVE BIO SURGEON STRL SZ7.5 (GLOVE) ×6 IMPLANT
GOWN STRL REUS W/ TWL LRG LVL3 (GOWN DISPOSABLE) ×4 IMPLANT
GOWN STRL REUS W/ TWL XL LVL3 (GOWN DISPOSABLE) ×6 IMPLANT
GOWN STRL REUS W/TWL 2XL LVL3 (GOWN DISPOSABLE) ×3 IMPLANT
GOWN STRL REUS W/TWL LRG LVL3 (GOWN DISPOSABLE) ×6
GOWN STRL REUS W/TWL XL LVL3 (GOWN DISPOSABLE) ×9
HEMOSTAT SURGICEL 2X14 (HEMOSTASIS) ×7 IMPLANT
KIT BASIN OR (CUSTOM PROCEDURE TRAY) ×3 IMPLANT
KIT SUCTION CATH 14FR (SUCTIONS) IMPLANT
KIT TURNOVER KIT B (KITS) ×3 IMPLANT
LOOP VESSEL SUPERMAXI WHITE (MISCELLANEOUS) IMPLANT
NDL HYPO 25GX1X1/2 BEV (NEEDLE) ×2 IMPLANT
NEEDLE 22X1 1/2 (OR ONLY) (NEEDLE) ×3 IMPLANT
NEEDLE HYPO 25GX1X1/2 BEV (NEEDLE) IMPLANT
NS IRRIG 1000ML POUR BTL (IV SOLUTION) ×8 IMPLANT
OBTURATOR OPTICAL STANDARD 8MM (TROCAR)
OBTURATOR OPTICAL STND 8 DVNC (TROCAR)
OBTURATOR OPTICALSTD 8 DVNC (TROCAR) IMPLANT
PACK CHEST (CUSTOM PROCEDURE TRAY) ×3 IMPLANT
PAD ARMBOARD 7.5X6 YLW CONV (MISCELLANEOUS) ×15 IMPLANT
PORT ACCESS TROCAR AIRSEAL 12 (TROCAR) ×2 IMPLANT
PORT ACCESS TROCAR AIRSEAL 5M (TROCAR) ×1
RELOAD STAPLE 45 2.5 WHT DVNC (STAPLE) IMPLANT
RELOAD STAPLE 45 3.5 BLU DVNC (STAPLE) IMPLANT
RELOAD STAPLE 45 4.3 GRN DVNC (STAPLE) IMPLANT
RELOAD STAPLER 2.5X45 WHT DVNC (STAPLE) ×8 IMPLANT
RELOAD STAPLER 3.5X45 BLU DVNC (STAPLE) ×14 IMPLANT
RELOAD STAPLER 4.3X45 GRN DVNC (STAPLE) ×2 IMPLANT
SEAL CANN UNIV 5-8 DVNC XI (MISCELLANEOUS) ×4 IMPLANT
SEAL XI 5MM-8MM UNIVERSAL (MISCELLANEOUS) ×6
SEALANT PROGEL (MISCELLANEOUS) IMPLANT
SEALANT SURG COSEAL 4ML (VASCULAR PRODUCTS) IMPLANT
SEALANT SURG COSEAL 8ML (VASCULAR PRODUCTS) IMPLANT
SET TRI-LUMEN FLTR TB AIRSEAL (TUBING) ×3 IMPLANT
SOLUTION ELECTROLUBE (MISCELLANEOUS) IMPLANT
SPONGE INTESTINAL PEANUT (DISPOSABLE) IMPLANT
STAPLER 45 DA VINCI SURE FORM (STAPLE) ×3
STAPLER 45 SUREFORM CVD (STAPLE) ×3
STAPLER 45 SUREFORM CVD DVNC (STAPLE) IMPLANT
STAPLER 45 SUREFORM DVNC (STAPLE) IMPLANT
STAPLER CANNULA SEAL DVNC XI (STAPLE) ×4 IMPLANT
STAPLER CANNULA SEAL XI (STAPLE) ×6
STAPLER RELOAD 2.5X45 WHITE (STAPLE) ×12
STAPLER RELOAD 2.5X45 WHT DVNC (STAPLE) ×8
STAPLER RELOAD 3.5X45 BLU DVNC (STAPLE) ×14
STAPLER RELOAD 3.5X45 BLUE (STAPLE) ×21
STAPLER RELOAD 4.3X45 GREEN (STAPLE) ×3
STAPLER RELOAD 4.3X45 GRN DVNC (STAPLE) ×2
STOPCOCK 4 WAY LG BORE MALE ST (IV SETS) ×3 IMPLANT
SUT MON AB 2-0 CT1 36 (SUTURE) IMPLANT
SUT PDS AB 1 CTX 36 (SUTURE) IMPLANT
SUT PROLENE 4 0 RB 1 (SUTURE)
SUT PROLENE 4-0 RB1 .5 CRCL 36 (SUTURE) IMPLANT
SUT SILK  1 MH (SUTURE) ×3
SUT SILK 1 MH (SUTURE) ×2 IMPLANT
SUT SILK 1 TIES 10X30 (SUTURE) IMPLANT
SUT SILK 2 0 SH (SUTURE) IMPLANT
SUT SILK 2 0SH CR/8 30 (SUTURE) IMPLANT
SUT VIC AB 1 CTX 36 (SUTURE)
SUT VIC AB 1 CTX36XBRD ANBCTR (SUTURE) IMPLANT
SUT VIC AB 2-0 CT1 27 (SUTURE) ×3
SUT VIC AB 2-0 CT1 TAPERPNT 27 (SUTURE) ×2 IMPLANT
SUT VIC AB 3-0 SH 27 (SUTURE) ×3
SUT VIC AB 3-0 SH 27X BRD (SUTURE) ×6 IMPLANT
SUT VICRYL 0 TIES 12 18 (SUTURE) ×3 IMPLANT
SUT VICRYL 0 UR6 27IN ABS (SUTURE) ×6 IMPLANT
SUT VICRYL 2 TP 1 (SUTURE) IMPLANT
SYR 10ML LL (SYRINGE) ×3 IMPLANT
SYR 20ML LL LF (SYRINGE) ×3 IMPLANT
SYR 50ML LL SCALE MARK (SYRINGE) ×3 IMPLANT
SYSTEM RETRIEVAL ANCHOR 15 (MISCELLANEOUS) ×1 IMPLANT
SYSTEM RETRIEVAL ANCHOR 8 (MISCELLANEOUS) ×1 IMPLANT
SYSTEM SAHARA CHEST DRAIN ATS (WOUND CARE) ×3 IMPLANT
TAPE CLOTH 4X10 WHT NS (GAUZE/BANDAGES/DRESSINGS) ×3 IMPLANT
TAPE CLOTH SURG 4X10 WHT LF (GAUZE/BANDAGES/DRESSINGS) ×1 IMPLANT
TIP APPLICATOR SPRAY EXTEND 16 (VASCULAR PRODUCTS) IMPLANT
TOWEL GREEN STERILE (TOWEL DISPOSABLE) ×3 IMPLANT
TRAY FOLEY MTR SLVR 16FR STAT (SET/KITS/TRAYS/PACK) ×3 IMPLANT
TROCAR BLADELESS 15MM (ENDOMECHANICALS) IMPLANT
TUBING EXTENTION W/L.L. (IV SETS) ×3 IMPLANT
WATER STERILE IRR 1000ML POUR (IV SOLUTION) ×3 IMPLANT

## 2020-09-06 SURGICAL SUPPLY — 46 items

## 2020-09-06 NOTE — H&P (Signed)
[address]       [place] [address]             [phone]         60 yo male presents for a robotic assisted left thoracoscopy, and left upper lobe resection following endobronchial biopsy of a 1.1cm nodule.  No changes since his clinic appointment.  Per my las clinic note         [address]       [place] [address]             [phone]                                                   Preston Gamble [facility] Medical Record #[id number] Date of Birth: December 25, 1960  Referring: Garner Nash, DO Primary Care: Patient, [name], MD  Chief Complaint:        Chief Complaint  Patient presents with  . Lung Lesion    New patient consultation, PFTs 1/12, Chest CT 12/13    History of Present Illness:    Preston Gamble 60 y.o. male referred by Dr. Valeta Harms for surgical evaluation of a 1.1 cm left upper lobe pulmonary nodule.  This was originally found incidentally during a cardiac evaluation a year ago.  On repeat imaging it has grown slightly, and has had some change in its solid component.  He also underwent a PET/CT which showed no SUV uptake in the nodule.  He remains asymptomatic from a pulmonary and cardiovascular standpoint.  He denies any logic symptoms.  He is able to walk 3 to 4 miles daily.  He quit smoking over 20 years ago.       Zubrod Score: At the time of surgery this patient's most appropriate activity status/level should be described as: [x] ?    0    Normal activity, no symptoms [] ?    1    Restricted in physical strenuous activity but ambulatory, able to do out light work [] ?    2    Ambulatory and capable of self care, unable to do work activities, up and about               >50 % of waking hours                              [] ?    3    Only limited self care, in bed greater than 50% of waking hours [] ?    4    Completely disabled, no self care,  confined to bed or chair [] ?    5    Moribund       Past Medical History:  Diagnosis Date  . Chest pain    due to injury  . Diabetes mellitus without complication ([place])   . Dilated aortic root ([place]) 05/27/2015   41mm by Chest CTA 2021  . Family history of early CAD 05/17/2016  . Hyperlipidemia   . Mitral valve prolapse    with mild MR  . Umbilical hernia          Past Surgical History:  Procedure Laterality Date  . PLANTAR FASCIA RELEASE    . SEPTOPLASTY  1994  .  UMBILICAL HERNIA REPAIR  07/09/2012   Procedure: HERNIA REPAIR UMBILICAL ADULT;  Surgeon: Haywood Lasso, MD;  Location: [facility];  Service: General;  Laterality: N/A;  repair umbilical hernia         Family History  Problem Relation Age of Onset  . CAD Father   . Diabetes Father   . Hyperlipidemia Sister   . Colon cancer Neg Hx   . Pancreatic cancer Neg Hx   . Stomach cancer Neg Hx      Social History       Tobacco Use  Smoking Status Former Smoker  . Quit date: 05/20/1997  . Years since quitting: 23.1  Smokeless Tobacco Never Used    Social History       Substance and Sexual Activity  Alcohol Use Yes  . Alcohol/week: 3.0 standard drinks  . Types: 3 Cans of beer per week   Comment: occasional     No Known Allergies        Current Outpatient Medications  Medication Sig Dispense Refill  . aspirin EC 81 MG tablet Take 81 mg by mouth daily.    Marland Kitchen atorvastatin (LIPITOR) 80 MG tablet Take 1 tablet (80 mg total) by mouth daily. 90 tablet 3  . BYDUREON BCISE 2 MG/0.85ML AUIJ Inject 2 mg once a week as directed.  6  . Cholecalciferol (D3-1000) 25 MCG (1000 UT) capsule Take 1,000 Units by mouth daily.    Marland Kitchen FARXIGA 10 MG TABS tablet TAKE 1 TABLET VMY MOUTH ONCE DAILY  7  . metFORMIN (GLUCOPHAGE) 1000 MG tablet Take 1 tablet by mouth daily.   5   No current facility-administered medications for this visit.    Review of Systems   Constitutional: Negative.   Respiratory: Negative.   Cardiovascular: Negative.   Musculoskeletal: Negative.   Neurological: Negative.      PHYSICAL EXAMINATION: BP (!) 147/83 (BP Location: Left Arm, Patient Position: Sitting, Cuff Size: Large)   Pulse 78   Resp 20   Ht 6\' 2"  (1.88 m)   Wt 251 lb 3.2 oz (113.9 kg)   SpO2 96% Comment: RA  BMI 32.25 kg/m  Physical Exam Constitutional:      General: He is not in acute distress.    Appearance: Normal appearance. He is normal weight.  HENT:     Head: Normocephalic and atraumatic.  Eyes:     Extraocular Movements: Extraocular movements intact.  Cardiovascular:     Rate and Rhythm: Normal rate.  Pulmonary:     Effort: Pulmonary effort is normal. No respiratory distress.  Abdominal:     General: Abdomen is flat. There is no distension.  Musculoskeletal:        General: Normal range of motion.     Cervical back: Normal range of motion.  Skin:    General: Skin is warm and dry.  Neurological:     General: No focal deficit present.     Mental Status: He is alert and oriented to person, place, and time.  Psychiatric:        Mood and Affect: Mood normal.     Diagnostic Studies & Laboratory data:     Recent Radiology Findings:    Imaging Results  NM PET Image Initial (PI) Skull Base To Thigh  Result Date: 07/14/2020 CLINICAL DATA:  Initial treatment strategy for pulmonary nodule. EXAM: NUCLEAR MEDICINE PET SKULL BASE TO THIGH TECHNIQUE: 12.6 mCi F-18 FDG was injected intravenously. Full-ring PET imaging was performed from the skull  base to thigh after the radiotracer. CT data was obtained and used for attenuation correction and anatomic localization. Fasting blood glucose: 139 mg/dl COMPARISON:  Chest CT 06/19/2020 FINDINGS: Mediastinal blood pool activity: SUV max 3.3 Liver activity: SUV max NA NECK: No hypermetabolic lymph nodes in the neck. Incidental CT findings: none CHEST: 11 mm left upper lobe pulmonary nodule shows  no hypermetabolism with SUV max = 1.1. No other suspicious or unexpected hypermetabolic disease in the chest. Incidental CT findings: Coronary artery calcification is evident. Atherosclerotic calcification is noted in the wall of the thoracic aorta. ABDOMEN/PELVIS: No abnormal hypermetabolic activity within the liver, pancreas, adrenal glands, or spleen. No hypermetabolic lymph nodes in the abdomen or pelvis. Incidental CT findings: 7 cm exophytic cyst noted lower pole left kidney diverticular changes are evident in the left colon without diverticulitis. SKELETON: No focal hypermetabolic activity to suggest skeletal metastasis. Incidental CT findings: none IMPRESSION: 1. No hypermetabolism discernible in the left upper lobe pulmonary nodule. Given small size of this lesion and sub solid components, neoplasm is not excluded. Continued close follow-up recommended. 2. No other unexpected or suspicious hypermetabolic disease in the neck, chest, abdomen, or pelvis. 3.  Aortic Atherosclerois (ICD10-170.0) Electronically Signed   By: Misty Stanley M.D.   On: 07/14/2020 14:55        I have independently reviewed the above radiology studies  and reviewed the findings with the patient.   Recent Lab Findings: Recent Labs       Lab Results  Component Value Date   HGB 18.1 (H) 07/09/2012   GLUCOSE 189 (H) 06/25/2019   CHOL 127 09/28/2019   TRIG 118 09/28/2019   HDL 39 (L) 09/28/2019   LDLCALC 67 09/28/2019   ALT 43 09/28/2019   NA 140 06/25/2019   K 4.7 06/25/2019   CL 102 06/25/2019   CREATININE 1.19 06/25/2019   BUN 15 06/25/2019   CO2 24 06/25/2019       PFTs: - FVC: 95% - FEV1: 103% -DLCO: 114%  Problem List: 1.1 cm left upper lobe pulmonary nodule.  Subsolid component.  Interval increase in size.  SUV uptake of 1.1.  Assessment / Plan:   This is a 60 year old gentleman with a left upper lobe 1.1 cm pulmonary nodule is concerning for primary lung cancer.  We  discussed several options for diagnosis and treatment, but he has significant anxiety about proceeding with a lobectomy if this turns out to be a lung cancer.  We agreed on performing a combination procedure with Dr. Valeta Harms which will include a navigational bronchoscopy with biopsy and potential marking.  The patient stated that if the navigational biopsy is negative he is okay proceeding with a robotic assisted wedge resection to ensure that the nodule does not contain any cancer.  If however the navigational bronchoscopy and biopsy does show primary lung cancer he does not want to proceed with a robotic assisted lobectomy at that time.  He would rather speak with his family before proceeding with a lobectomy versus considering SBRT.

## 2020-09-06 NOTE — Discharge Summary (Signed)
Physician Discharge Summary       [address]       [place] [address]             [phone]    Patient ID: Preston Gamble MRN: [id number] DOB/AGE: 08/17/1960 60 y.o.  Admit date: 09/06/2020 Discharge date: 09/07/2020  Admission Diagnoses: Left upper lobe pulmonary nodule  Discharge Diagnoses:  1. S/p robotic assisted wedge or LUL 2. History of tobacco abuse 3. History of Diabetes mellitus without complication ([place]) 4. History of Dilated aortic root ([place]) 5. History of Hyperlipidemia 6. History of Mitral valve prolapse 7. History of Umbilical hernia  Patient Active Problem List   Diagnosis Date Noted  . Status post robot-assisted surgical procedure 09/06/2020  . Left upper lobe pulmonary nodule 09/06/2020  . Lung nodule 07/25/2020  . Family history of early CAD 05/17/2016  . MVP (mitral valve prolapse) 05/16/2016  . Dilated aortic root ([place]) 05/27/2015  . Diabetes mellitus without complication ([place])   . Dyslipidemia associated with type 2 diabetes mellitus ([place])      Consults: None  Procedure (s):  XI ROBOTIC ASSISTED THORASCOPY, WEDGE LEFT UPPER LOBE, LOBECTOMY OF THE LEFT UPPER LOBE, LYMPH NODE BIOPSIES, LEFT INTERCOSTAL NERVE BLOCK by Dr. Kipp Brood on 09/06/2020.  Pathology:PENDING  History of Presenting Illness: Preston Gamble 60 y.o.malereferred by Dr. Valeta Harms for surgical evaluation of a 1.1 cm left upper lobe pulmonary nodule. This was originally found incidentally during a cardiac evaluation a year ago. On repeat imaging it has grown slightly, and has had some change in its solid component. He also underwent a PET/CT which showed no SUV uptake in the nodule.  He remains asymptomatic from a pulmonary and cardiovascular standpoint. He denies any logic symptoms. He is able to walk 3 to 4 miles daily. He quit smoking over 20 years ago.  Dr. Kipp Brood discussed several options for diagnosis and treatment, but the patient has  significant anxiety about proceeding with a lobectomy if this turns out to be a lung cancer. Dr. Kipp Brood and the patient agreed on performing a combination procedure with Dr. Valeta Harms which will include a navigational bronchoscopy with biopsy and potential marking. The patient stated that if the navigational biopsy is negative he is okay proceeding with a robotic assisted wedge resection to ensure that the nodule does not contain any cancer. If however the navigational bronchoscopy and biopsy does show primary lung cancer he does not want to proceed with a robotic assisted lobectomy at that time. Patient would rather speak with his family before proceeding with a lobectomy versus considering SBRT. Patient presented to [facility] on 09/06/2020 in order to undergo the procedure.  Brief Hospital Course: The patient was admitted and taken the operating room and underwent the above described procedure by Dr. Kipp Brood.  He tolerated well and was taken to the postanesthesia care unit in stable condition.  Postoperative hospital course: The patient has done quite well.  He has remained hemodynamically stable in sinus rhythm.  He is found to not have a postoperative anemia.  He has a slight leukocytosis which is consistent with inflammatory response.  Renal function has remained within normal limits.  Chest tube was removed on postop day #1.  Final pathology is pending.  Chest x-ray on postop day 1 was notable for repeat resolution of a previously seen moderate pneumothorax. Follow up CXR was clear of any new issues and he was discharged in stable condition    Latest Vital Signs: Blood pressure 117/75,  pulse 66, temperature 98.1 F (36.7 C), temperature source Oral, resp. rate 14, height 6\' 2"  (1.88 m), weight 112.9 kg, SpO2 95 %.  Physical Exam: General appearance: alert, cooperative and mild distress Neurologic: intact Heart: NSR Lungs: Breath sounds clear, shallow. Bloody drainage in the CT tubing.  Small air leak. CXR has been done but result and image are not yet accessable.  Wound: The left chest port incisions are approximated and dry.   Discharge Condition: Stable and discharged to home.  Recent laboratory studies:  Lab Results  Component Value Date   WBC 11.3 (H) 09/07/2020   HGB 14.2 09/07/2020   HCT 41.7 09/07/2020   MCV 82.6 09/07/2020   PLT 219 09/07/2020   Lab Results  Component Value Date   NA 135 09/07/2020   K 4.1 09/07/2020   CL 104 09/07/2020   CO2 20 (L) 09/07/2020   CREATININE 1.04 09/07/2020   GLUCOSE 159 (H) 09/07/2020      Diagnostic Studies: DG Chest 2 View  Result Date: 09/05/2020 CLINICAL DATA:  Status post bronchoscopy. EXAM: CHEST - 2 VIEW COMPARISON:  Dec 01, 2010. FINDINGS: The heart size and mediastinal contours are within normal limits. Both lungs are clear. No pneumothorax or pleural effusion is noted. The visualized skeletal structures are unremarkable. IMPRESSION: No active cardiopulmonary disease. Electronically Signed   By: Marijo Conception M.D.   On: 09/05/2020 10:53   US Renal  Result Date: 08/31/2020 CLINICAL DATA:  Left renal cyst EXAM: RENAL / URINARY TRACT ULTRASOUND COMPLETE COMPARISON:  06/10/2015, 07/14/2020 FINDINGS: Right Kidney: Renal measurements: 12.4 x 7.2 x 4.7 cm = volume: 216.6 mL. Echogenicity within normal limits. 0.5 cm cyst lower pole cortex. No other renal abnormalities. Left Kidney: Renal measurements: 13.3 x 7.0 x 5.4 cm = volume: 164.1 mL. Echogenicity within normal limits. Lobular cyst lower pole left kidney measuring 9.6 x 6.7 x 7.0 cm increased in size since 2016. There is reverberation artifact seen within the cyst, with no evidence of septation or solid lesion. No other renal abnormalities. Bladder: Appears normal for degree of bladder distention. Other: None. IMPRESSION: 1. Benign slightly lobular left renal cyst, increased in size since prior MRI 2016. 2. Otherwise unremarkable renal ultrasound. Electronically  Signed   By: Randa Ngo M.D.   On: 08/31/2020 21:53   DG Chest Port 1 View  Result Date: 09/07/2020 CLINICAL DATA:  Pleural effusion. EXAM: PORTABLE CHEST 1 VIEW COMPARISON:  09/07/2020.  09/07/2019. FINDINGS: Interim removal of left chest tube. No definite pneumothorax identified. Atelectatic changes throughout the left lung and right base again noted. Underlying infiltrates cannot be excluded. Small left pleural effusion cannot be excluded. Stable cardiomegaly. Prior cervical spine fusion. IMPRESSION: 1. Interim removal of left chest tube. No definite pneumothorax identified. 2. Atelectatic changes noted about the left lung and right base. Underlying infiltrates can not be excluded. Small left pleural effusion cannot be excluded. 3.  Stable cardiomegaly. Electronically Signed   By: Marcello Moores  Register   On: 09/07/2020 15:37   DG CHEST PORT 1 VIEW  Result Date: 09/07/2020 CLINICAL DATA:  Follow-up chest tube EXAM: PORTABLE CHEST 1 VIEW COMPARISON:  09/06/2020 FINDINGS: Cardiac shadow is at the upper limits of normal but stable. Left chest tube is again seen in satisfactory position. Previously seen interface is again identified although significant lung markings are noted beyond consistent with resolution of pneumothorax. No new focal abnormality is seen. No sizable effusion is seen. No bony abnormality is noted. IMPRESSION: Resolution of previously seen  pneumothorax. Electronically Signed   By: Inez Catalina M.D.   On: 09/07/2020 08:40   DG Chest Port 1 View  Result Date: 09/06/2020 CLINICAL DATA:  Pneumothorax EXAM: PORTABLE CHEST 1 VIEW COMPARISON:  09/04/2020 FINDINGS: There is a moderate-sized left-sided pneumothorax. There is a left-sided chest tube in place. The heart size is stable. The right lung field appears to be essentially clear. There is no acute displaced fracture. IMPRESSION: Moderate-sized left-sided pneumothorax. Electronically Signed   By: Constance Holster M.D.   On: 09/06/2020 20:40    ECHOCARDIOGRAM COMPLETE  Result Date: 08/10/2020    ECHOCARDIOGRAM REPORT   Patient Name:   RONNEY HONEYWELL Date of Exam: 08/10/2020 Medical Rec #:  [id number]           Height:       74.0 in Accession #:    [id number]          Weight:       251.2 lb Date of Birth:  1961/01/08           BSA:          2.395 m Patient Age:    36 years            BP:           118/74 mmHg Patient Gender: M                   HR:           60 bpm. Exam Location:  [facility] Procedure: 2D Echo, 3D Echo, Cardiac Doppler, Color Doppler and Strain Analysis Indications:    I77.810 Dilated aortic root.  History:        Patient has prior history of Echocardiogram examinations, most                 recent 06/15/2019. Mitral Valve Prolapse; Risk Factors:Family                 History of Coronary Artery Disease, Dyslipidemia, Former Smoker                 and Diabetes. Dilated aortic root.  Sonographer:    Jessee Avers, RDCS Referring Phys: [place]  1. Left ventricular ejection fraction, by estimation, is 60 to 65%. The left ventricle has normal function. The left ventricle has no regional wall motion abnormalities. Left ventricular diastolic parameters were normal. The average left ventricular global longitudinal strain is -25.4 %.  2. Right ventricular systolic function is normal. The right ventricular size is normal.  3. The mitral valve is normal in structure. Trivial mitral valve regurgitation. No evidence of mitral stenosis.  4. The aortic valve is tricuspid. Aortic valve regurgitation is not visualized. No aortic stenosis is present.  5. Aortic dilatation noted. There is mild dilatation of the ascending aorta, measuring 42 mm.  6. The inferior vena cava is dilated in size with >50% respiratory variability, suggesting right atrial pressure of 8 mmHg. Comparison(s): No significant change from prior study. 06/15/19 EF 60-65%. Aortic root 25mm. Ascending aorta 49mm. Conclusion(s)/Recommendation(s): Otherwise  normal echocardiogram, with minor abnormalities described in the report. No major change from prior. Ascending aorta now measures at 42 mm (prior 41 mm) but this is may be due to measurement angle. FINDINGS  Left Ventricle: Left ventricular ejection fraction, by estimation, is 60 to 65%. The left ventricle has normal function. The left ventricle has no regional wall motion abnormalities. The average left ventricular global  longitudinal strain is -25.4 %. The left ventricular internal cavity size was normal in size. There is no left ventricular hypertrophy. Left ventricular diastolic parameters were normal. Right Ventricle: The right ventricular size is normal. No increase in right ventricular wall thickness. Right ventricular systolic function is normal. Left Atrium: Left atrial size was normal in size. Right Atrium: Right atrial size was normal in size. Pericardium: There is no evidence of pericardial effusion. Mitral Valve: The mitral valve is normal in structure. Trivial mitral valve regurgitation. No evidence of mitral valve stenosis. Tricuspid Valve: The tricuspid valve is normal in structure. Tricuspid valve regurgitation is trivial. No evidence of tricuspid stenosis. Aortic Valve: The aortic valve is tricuspid. Aortic valve regurgitation is not visualized. No aortic stenosis is present. Pulmonic Valve: The pulmonic valve was grossly normal. Pulmonic valve regurgitation is trivial. No evidence of pulmonic stenosis. Aorta: Aortic dilatation noted. There is mild dilatation of the ascending aorta, measuring 42 mm. Venous: The inferior vena cava is dilated in size with greater than 50% respiratory variability, suggesting right atrial pressure of 8 mmHg. IAS/Shunts: The atrial septum is grossly normal.  LEFT VENTRICLE PLAX 2D LVIDd:         5.10 cm  Diastology LVIDs:         3.60 cm  LV e' medial:    9.28 cm/s LV PW:         0.90 cm  LV E/e' medial:  5.3 LV IVS:        0.90 cm  LV e' lateral:   7.35 cm/s LVOT diam:      2.10 cm  LV E/e' lateral: 6.7 LV SV:         78 LV SV Index:   33       2D Longitudinal Strain LVOT Area:     3.46 cm 2D Strain GLS (A2C):   -30.9 %                         2D Strain GLS (A3C):   -21.5 %                         2D Strain GLS (A4C):   -24.0 %                         2D Strain GLS Avg:     -25.4 %                          3D Volume EF:                         3D EF:        56 %                         LV EDV:       162 ml                         LV ESV:       71 ml                         LV SV:        91 ml RIGHT VENTRICLE RV Basal diam:  3.70 cm RV S prime:  9.21 cm/s TAPSE (M-mode): 1.8 cm LEFT ATRIUM             Index       RIGHT ATRIUM           Index LA diam:        4.30 cm 1.80 cm/m  RA Pressure: 3.00 mmHg LA Vol (A2C):   59.6 ml 24.89 ml/m RA Area:     15.40 cm LA Vol (A4C):   40.9 ml 17.08 ml/m RA Volume:   39.20 ml  16.37 ml/m LA Biplane Vol: 51.9 ml 21.67 ml/m  AORTIC VALVE LVOT Vmax:   109.00 cm/s LVOT Vmean:  64.300 cm/s LVOT VTI:    0.225 m  AORTA Ao Root diam: 4.20 cm Ao Asc diam:  4.20 cm MITRAL VALVE               TRICUSPID VALVE                            Estimated RAP:  3.00 mmHg  MV E velocity: 49.40 cm/s  SHUNTS MV A velocity: 51.80 cm/s  Systemic VTI:  0.22 m MV E/A ratio:  0.95        Systemic Diam: 2.10 cm Buford Dresser MD Electronically signed by Buford Dresser MD Signature Date/Time: 08/10/2020/11:19:53 AM    Final    CT Super D Chest Wo Contrast  Result Date: 09/02/2020 CLINICAL DATA:  Pre bronchoscopy mapping. Left upper lobe lung nodule. EXAM: CT CHEST WITHOUT CONTRAST TECHNIQUE: Multidetector CT imaging of the chest was performed using thin slice collimation for electromagnetic bronchoscopy planning purposes, without intravenous contrast. COMPARISON:  PET-CT 07/14/2020. FINDINGS: Cardiovascular: Heart size appears within normal limits. Aortic atherosclerosis. Coronary artery calcifications. Mediastinum/Nodes: No enlarged mediastinal, hilar,  or axillary lymph nodes. Thyroid gland, trachea, and esophagus demonstrate no significant findings. Lungs/Pleura: No pleural effusion. No airspace consolidation, atelectasis or pneumothorax. Sub solid nodule within the lateral left upper lobe is again noted. This measures 1.1 cm, image 59/3. No additional lung nodules. Upper Abdomen: No acute findings. Similar appearance of benign hemangioma within the posterior right hepatic lobe measuring 3 cm, image 133/2. Musculoskeletal: No chest wall mass or suspicious bone lesions identified. IMPRESSION: 1. No acute cardiopulmonary abnormalities. 2. Subsolid nodule within the lateral left upper lobe is again noted and appears unchanged measuring 1.1 cm. 3. Coronary artery calcifications. Aortic Atherosclerosis (ICD10-I70.0). Electronically Signed   By: Kerby Moors M.D.   On: 09/02/2020 18:29   DG C-ARM BRONCHOSCOPY  Result Date: 09/06/2020 C-ARM BRONCHOSCOPY: Fluoroscopy was utilized by the requesting physician.  No radiographic interpretation.   Discharge Instructions    Discharge patient   Complete by: As directed    Discharge disposition: 01-Home or Self Care   Discharge patient date: 09/07/2020      Discharge Medications: Allergies as of 09/07/2020   No Known Allergies     Medication List    TAKE these medications   aspirin EC 81 MG tablet Take 81 mg by mouth daily.   atorvastatin 80 MG tablet Commonly known as: LIPITOR Take 1 tablet (80 mg total) by mouth daily. What changed: See the new instructions.   D3-1000 25 MCG (1000 UT) capsule Generic drug: Cholecalciferol Take 1,000 Units by mouth daily.   Farxiga 10 MG Tabs tablet Generic drug: dapagliflozin propanediol Take 10 mg by mouth daily.   metFORMIN 1000 MG tablet Commonly known as: GLUCOPHAGE Take 1,000 mg by mouth daily with breakfast.  phenazopyridine 200 MG tablet Commonly known as: PYRIDIUM Take 1 tablet (200 mg total) by mouth 3 (three) times daily as needed for pain.  for urinary tract discomfort only   traMADol 50 MG tablet Commonly known as: ULTRAM Take 1 tablet (50 mg total) by mouth every 6 (six) hours as needed for up to 7 days (mild pain).   Trulicity 1.5 GE/3.6OQ Sopn Generic drug: Dulaglutide Inject 1.5 mg into the skin once a week.       Follow Up Appointments:  Follow-up Information    Lajuana Matte, MD. Go on 09/15/2020.   Specialty: Cardiothoracic Surgery Why: Appointment time is at  Contact information: [address] [place] [address] [phone]               Signed: Gaspar Bidding 09/07/2020, 4:03 PM

## 2020-09-06 NOTE — Anesthesia Preprocedure Evaluation (Addendum)
Anesthesia Evaluation  Patient identified by MRN, date of birth, ID band Patient awake    Reviewed: Allergy & Precautions, NPO status , Patient's Chart, lab work & pertinent test results  Airway Mallampati: II  TM Distance: >3 FB Neck ROM: Full    Dental  (+) Teeth Intact   Pulmonary former smoker,  Pulmonary nodule   Pulmonary exam normal        Cardiovascular negative cardio ROS  + Valvular Problems/Murmurs MVP  Rhythm:Regular Rate:Normal     Neuro/Psych negative neurological ROS  negative psych ROS   GI/Hepatic negative GI ROS, Neg liver ROS,   Endo/Other  diabetes, Well Controlled, Type 2, Oral Hypoglycemic Agents  Renal/GU negative Renal ROS  negative genitourinary   Musculoskeletal negative musculoskeletal ROS (+)   Abdominal (+)  Abdomen: soft. Bowel sounds: normal.  Peds  Hematology negative hematology ROS (+)   Anesthesia Other Findings   Reproductive/Obstetrics                            Anesthesia Physical Anesthesia Plan  ASA: II  Anesthesia Plan: General   Post-op Pain Management:    Induction: Intravenous  PONV Risk Score and Plan: 2 and Ondansetron, Dexamethasone, Midazolam and Treatment may vary due to age or medical condition  Airway Management Planned: Mask and Double Lumen EBT  Additional Equipment: Arterial line  Intra-op Plan:   Post-operative Plan: Extubation in OR  Informed Consent: I have reviewed the patients History and Physical, chart, labs and discussed the procedure including the risks, benefits and alternatives for the proposed anesthesia with the patient or authorized representative who has indicated his/her understanding and acceptance.     Dental advisory given  Plan Discussed with: CRNA  Anesthesia Plan Comments: (ECHO 02/22: Left ventricular ejection fraction, by estimation, is 60 to 65%. The left ventricle has normal function. The  left ventricle has no regional wall motion abnormalities. Left ventricular diastolic parameters were normal. The average left ventricular global longitudinal strain is - 25.4 %. 2. Right ventricular systolic function is normal. The right ventricular size is normal. 3. The mitral valve is normal in structure. Trivial mitral valve regurgitation. No evidence of mitral stenosis. 4. The aortic valve is tricuspid. Aortic valve regurgitation is not visualized. No aortic stenosis is present. 5. Aortic dilatation noted. There is mild dilatation of the ascending aorta, measuring 42 mm. 6. The inferior vena cava is dilated in size with >50% respiratory variability, suggesting right atrial pressure of 8 mmHg. Lab Results      Component                Value               Date                      WBC                      7.2                 09/04/2020                HGB                      16.5                09/04/2020  HCT                      48.3                09/04/2020                MCV                      82.7                09/04/2020                PLT                      233                 09/04/2020           Lab Results      Component                Value               Date                      NA                       136                 09/04/2020                K                        4.1                 09/04/2020                CO2                      18 (L)              09/04/2020                GLUCOSE                  175 (H)             09/04/2020                BUN                      16                  09/04/2020                CREATININE               1.04                09/04/2020                CALCIUM                  9.1                 09/04/2020                GFRNONAA                 >  60                 09/04/2020                GFRAA                    78                  06/25/2019          )       Anesthesia Quick Evaluation

## 2020-09-06 NOTE — Op Note (Addendum)
Video Bronchoscopy with Electromagnetic Navigation biopsies plus methylene blue and indigo carmine green fiducial dye marking procedure Note  Date of Operation: 09/06/2020  Pre-op Diagnosis: Subsolid left upper lobe lung nodule  Post-op Diagnosis: Subsolid left upper lobe lung nodule  Surgeon: Garner Nash, DO   Assistants: None   Anesthesia: General endotracheal anesthesia  Operation: Flexible video fiberoptic bronchoscopy with electromagnetic navigation and biopsies.  Estimated Blood Loss: Minimal  Complications: None   Indications and History: Preston Gamble is a 60 y.o. male with subsolid left upper lobe lung nodule.  The risks, benefits, complications, treatment options and expected outcomes were discussed with the patient.  The possibilities of pneumothorax, pneumonia, reaction to medication, pulmonary aspiration, perforation of a viscus, bleeding, failure to diagnose a condition and creating a complication requiring transfusion or operation were discussed with the patient who freely signed the consent.    Description of Procedure: The patient was seen in the Preoperative Area, was examined and was deemed appropriate to proceed.  The patient was taken to [facility] endoscopy room 2, identified as [name] and the procedure verified as Flexible Video Fiberoptic Bronchoscopy.  A Time Out was held and the above information confirmed.   Prior to the date of the procedure a high-resolution CT scan of the chest was performed. Utilizing [place] a virtual tracheobronchial tree was generated to allow the creation of distinct navigation pathways to the patient's parenchymal abnormalities. After being taken to the operating room general anesthesia was initiated and the patient  was orally intubated. The video fiberoptic bronchoscope was introduced via the endotracheal tube and a general inspection was performed which showed normal right and left lung anatomy with no  evidence of endobronchial lesion. The extendable working channel and locator guide were introduced into the bronchoscope. The distinct navigation pathways prepared prior to this procedure were then utilized to navigate to within 0.5 cm of patient's lesion(s) identified on CT scan. The extendable working channel was secured into place and the locator guide was withdrawn. Under fluoroscopic guidance transbronchial needle brushings, transbronchial Wang needle biopsies, and transbronchial forceps biopsies were performed to be sent for cytology and pathology.  The transbronchial needle was loaded with a 50: 50 percentage mixture of methylene blue and indigo carmine green solution.  The needle was placed under fluoroscopy into the suspected subsolid lesion and 2 cc of the fiducial dye mark were injected.  At the end of the procedure a general airway inspection was performed and there was no evidence of active bleeding. The bronchoscope was removed.  The patient tolerated the procedure well. There was no significant blood loss and there were no obvious complications.   Samples: 1. Transbronchial needle brushings from Left upper lobe 2. Transbronchial Wang needle biopsies from left upper lobe 3. Transbronchial forceps biopsies from left upper lobe  Plans:  The patient will be discharged from the PACU to home when recovered from anesthesia and after chest x-ray is reviewed. We will review the cytology, pathology and microbiology results with the patient when they become available. Outpatient followup will be with Garner Nash, DO     Garner Nash, DO [facility] 09/06/2020 4:20 PM

## 2020-09-06 NOTE — Brief Op Note (Signed)
09/06/2020  7:10 PM  PATIENT:  Preston Gamble  60 y.o. male  PRE-OPERATIVE DIAGNOSIS:  LEFT UPPER LOBE PULMONARY NODULE  POST-OPERATIVE DIAGNOSIS: ADENOCARCINOMA LEFT UPPER LOBE  PROCEDURE:  XI ROBOTIC ASSISTED THORASCOPY, WEDGE LEFT UPPER LOBE, LOBECTOMY OF THE LEFT UPPER LOBE, LYMPH NODE BIOPSIES, LEFT INTERCOSTAL NERVE BLOCK   SURGEON:  Surgeon(s) and Role:    Lightfoot, Lucile Crater, MD - Primary  PHYSICIAN ASSISTANT: Lars Pinks PA-C  ANESTHESIA:   general  EBL:  Per anesthesia record  BLOOD ADMINISTERED:none  DRAINS: 67 Frenc chest tube placed in the left pleural space   LOCAL MEDICATIONS USED:  OTHER Exparel  SPECIMEN:  Source of Specimen:  Wedge LUL, LUL, multiple lymph nodes  DISPOSITION OF SPECIMEN:  Pathology. Frozen of wedge LUL showed adenocarcinoma  COUNTS CORRECT:  YES  DICTATION: .Dragon Dictation  PLAN OF CARE: Admit to inpatient   PATIENT DISPOSITION:  PACU - hemodynamically stable.   Delay start of Pharmacological VTE agent (>24hrs) due to surgical blood loss or risk of bleeding: yes

## 2020-09-06 NOTE — Op Note (Signed)
      [address]       [place] [address]             [id number]        09/06/2020  Patient:  [name] Pre-Op Dx: Left upper lobe pulmonary nodule Post-op Dx: Left upper lobe adenocarcinoma Procedure: - Robotic assisted left video thoracoscopy -Left upper lobe wedge resection -Left upper lobectomy - Mediastinal lymph node sampling - Intercostal nerve block  Surgeon and Role:      * Zennie Ayars, Lucile Crater, MD - Primary    *D. Tacy Dura, PA-C- assisting  Anesthesia  general EBL: 100 ml Blood Administration: None Specimen: Left upper lobe wedge resection, left upper lobectomy, level 6, and hilar lymph nodes.  Drains: 55 F argyle chest tube in left chest Counts: correct   Indications: This is a 60 year old gentleman that was evaluated in clinic for an enlarging left upper lobe pulmonary nodule.  He agreed to undergo a combination procedure with Dr. Valeta Harms in which she underwent navigational bronchoscopy along with ICG marking followed by left robotic assisted thoracoscopy for definitive diagnosis and treatment.  Findings: The ICG marking was evident on the pleural surface.  A wedge resection was taken was consistent with non-small cell lung cancer.  He had some bulky lymphadenopathy in the AP window.  Operative Technique: After the risks, benefits and alternatives were thoroughly discussed, the patient was brought to the operative theatre.  Anesthesia was induced, and the bronchoscope was passed through the endotracheal tube.  All segmental bronchi were visualized.  The endotracheal tube was then exchanged for a double lumen tube.  The patient was then placed in a right lateral decubitus position and was prepped and draped in normal sterile fashion.  An appropriate surgical pause was performed, and pre-operative antibiotics were dosed accordingly.  We began by placing our 4 robotic ports in the the 7th intercostal space targeting the hilum of the lung.   A 56mm assistant port was placed in the 9th intercostal space in the anterior axillary line.  The robot was then docked and all instruments were passed under direct visualization.    The lung was then retracted superiorly, and the inferior pulmonary ligament was divided.  The hilum was mobilized anteriorly and posteriorly.  We identified the upper lobe pulmonary vein, and after careful isolation, it was divided with a vascular stapler.  We next moved to the fissure.  The lingular artery was then divided with a vascular load stapler.  We then moved back to the anterior surface of the hilum and identified the truncal branches, and divided these with an robotic stapler.  The bronchus to the upper lobe was then isolated.  After a test clamp, with good ventilation of the lower lobe, the bronchus was then divided.  The fissure was completed, and the specimen was passed into an endocatch bag.  It was removed from the anterior access site.    Lymph nodes were then sampled at levels level 5 and 6.  The chest was irrigated, and an air leak test was performed.  An intercostal nerve block was performed under direct visualization.  A 28 F chest with then placed, and we watch the remaining lobes re-expand.  The skin and soft tissue were closed with absorbable suture    The patient tolerated the procedure without any immediate complications, and was transferred to the PACU in stable condition.  Tamanna Whitson Bary Leriche

## 2020-09-06 NOTE — Discharge Instructions (Signed)

## 2020-09-06 NOTE — Consult Note (Signed)
NAME:  Preston Gamble, MRN:  [id number], DOB:  28-Oct-1960, LOS: 0 ADMISSION DATE:  09/06/2020, CONSULTATION DATE: 10/05/2018 REFERRING MD: Dr. Kipp Brood, CHIEF COMPLAINT: Lung nodule  History of Present Illness:  This is a 60 year old gentleman initially seen in consultation on 07/05/2020 for abnormal CT imaging.  Patient has a past medical history of diabetes, hyperlipidemia, mitral valve prolapse, family history of coronary disease.  Patient initially had an abnormal coronary CT which led to follow-up CT imaging in December 2021 which revealed a persistent groundglass opacity.  Patient had interval increase in the subsolid groundglass nodule concerning for a primary bronchogenic carcinoma.  The initial nodule was 1 x 0.7 cm and had increased to 1.1 x 0.9 cm.  He is a former smoker that quit in 1998 he smoked for approximately 20 years.  Patient was subsequently referred to cardiothoracic surgery for consultation.  Patient was seen by Dr. Kipp Brood on 07/25/2020.  Patient had a PET scan completed in between which showed low-level uptake.  They had a discussion regarding next best steps.  After having a multidisciplinary discussion between myself and thoracic surgery and the patient we made a decision for a combination video bronchoscopy with navigation, fiducial dye marking, we also plan for biopsies if there is concern or a positive malignancy diagnosis then we will plan for transfer immediately to the operating room for lobectomy.  If needed will do wedge resection prior to lobectomy.  Patient was agreeable to this plan.  Patient presents to the hospital today to execute discussed plan for combined ENB plus robotic assisted thorascopic surgery.  Past Medical History:   Past Medical History:  Diagnosis Date  . Chest pain    due to injury  . Diabetes mellitus without complication ([place])   . Dilated aortic root ([place]) 05/27/2015   8m by Chest CTA 2021  . Family history of early CAD  05/17/2016  . Hyperlipidemia   . Mitral valve prolapse    with mild MR  . Umbilical hernia      Significant Hospital Events:  09/06/2020: ENB plus RATS  Consults:  Pulmonary   Procedures:   Significant Diagnostic Tests:   Micro Data:    Antimicrobials:     Interim History / Subjective:  Per HPI above  Objective   Blood pressure (!) 167/87, pulse 71, temperature 97.7 F (36.5 C), temperature source Oral, resp. rate 20, height 6' 2"  (1.88 m), weight 111.1 kg, SpO2 95 %.       No intake or output data in the 24 hours ending 09/06/20 1147 Filed Weights   09/06/20 1043  Weight: 111.1 kg    Examination: General: Middle-aged male comfortable in bed no distress, anxious HENT: NCAT tracking appropriately Lungs: Clear to auscultation bilaterally no crackles no wheeze Cardiovascular: Regular rate rhythm S1-S2 Abdomen: Soft nontender nondistended Extremities: No significant edema Neuro: Alert oriented following commands GU: Deferred  CT chest super D formatting 09/01/2020: Subsolid left upper lobe lung nodule concerning for primary bronchogenic carcinoma.  Resolved Hospital Problem list     Assessment & Plan:   Left upper lobe subsolid lung nodule concerning for a primary bronchogenic carcinoma Former smoker  Plan: Met perioperatively and discussed the risk benefits and alternatives of proceeding with navigational bronchoscopy tissue biopsy and fiducial dye marking prior to robotic assisted thorascopic surgery. All questions were answered. We discussed risk of bleeding and pneumothorax. Patient is agreeable to proceed. No barriers at this time.  Pulmonary appreciates the consultation   Labs  CBC: Recent Labs  Lab 09/04/20 1505  WBC 7.2  HGB 16.5  HCT 48.3  MCV 82.7  PLT 151    Basic Metabolic Panel: Recent Labs  Lab 09/04/20 1505  NA 136  K 4.1  CL 106  CO2 18*  GLUCOSE 175*  BUN 16  CREATININE 1.04  CALCIUM 9.1   GFR: Estimated  Creatinine Clearance: 104 mL/min (by C-G formula based on SCr of 1.04 mg/dL). Recent Labs  Lab 09/04/20 1505  WBC 7.2    Liver Function Tests: Recent Labs  Lab 09/04/20 1505  AST 24  ALT 40  ALKPHOS 81  BILITOT 1.8*  PROT 6.2*  ALBUMIN 4.0   No results for input(s): LIPASE, AMYLASE in the last 168 hours. No results for input(s): AMMONIA in the last 168 hours.  ABG    Component Value Date/Time   PHART 7.401 09/04/2020 1518   PCO2ART 35.9 09/04/2020 1518   PO2ART 87.3 09/04/2020 1518   HCO3 21.8 09/04/2020 1518   ACIDBASEDEF 2.2 (H) 09/04/2020 1518   O2SAT 96.9 09/04/2020 1518     Coagulation Profile: Recent Labs  Lab 09/04/20 1505  INR 1.0    Cardiac Enzymes: No results for input(s): CKTOTAL, CKMB, CKMBINDEX, TROPONINI in the last 168 hours.  HbA1C: Hgb A1c MFr Bld  Date/Time Value Ref Range Status  09/04/2020 03:06 PM 8.4 (H) 4.8 - 5.6 % Final    Comment:    (NOTE) Pre diabetes:          5.7%-6.4%  Diabetes:              >6.4%  Glycemic control for   <7.0% adults with diabetes     CBG: Recent Labs  Lab 09/04/20 1506 09/06/20 1045  GLUCAP 159* 166*     Review of Systems:    Review of Systems  Constitutional: Negative for chills, fever, malaise/fatigue and weight loss.  HENT: Negative for hearing loss, sore throat and tinnitus.   Eyes: Negative for blurred vision and double vision.  Respiratory: Negative for cough, hemoptysis, sputum production, shortness of breath, wheezing and stridor.   Cardiovascular: Negative for chest pain, palpitations, orthopnea, leg swelling and PND.  Gastrointestinal: Negative for abdominal pain, constipation, diarrhea, heartburn, nausea and vomiting.  Genitourinary: Negative for dysuria, hematuria and urgency.  Musculoskeletal: Negative for joint pain and myalgias.  Skin: Negative for itching and rash.  Neurological: Negative for dizziness, tingling, weakness and headaches.  Endo/Heme/Allergies: Negative for  environmental allergies. Does not bruise/bleed easily.  Psychiatric/Behavioral: Negative for depression. The patient is not nervous/anxious and does not have insomnia.   All other systems reviewed and are negative.    Past Medical History:  He,  has a past medical history of Chest pain, Diabetes mellitus without complication ([place]), Dilated aortic root ([place]) (05/27/2015), Family history of early CAD (05/17/2016), Hyperlipidemia, Mitral valve prolapse, and Umbilical hernia.   Surgical History:   Past Surgical History:  Procedure Laterality Date  . HERNIA REPAIR    . NECK SURGERY    . PLANTAR FASCIA RELEASE    . SEPTOPLASTY  1994  . UMBILICAL HERNIA REPAIR  07/09/2012   Procedure: HERNIA REPAIR UMBILICAL ADULT;  Surgeon: Haywood Lasso, MD;  Location: [facility];  Service: General;  Laterality: N/A;  repair umbilical hernia     Social History:   reports that he quit smoking about 23 years ago. He has never used smokeless tobacco. He reports current alcohol use of about 3.0 standard drinks of alcohol per  week. He reports that he does not use drugs.   Family History:  His family history includes CAD in his father; Diabetes in his father, maternal grandfather, maternal grandmother, paternal grandfather, paternal grandmother, and sister; Hyperlipidemia in his sister. There is no history of Colon cancer, Pancreatic cancer, or Stomach cancer.   Allergies No Known Allergies   Home Medications  Prior to Admission medications   Medication Sig Start Date End Date Taking? Authorizing Provider  aspirin EC 81 MG tablet Take 81 mg by mouth daily.   Yes [provider]  atorvastatin (LIPITOR) 80 MG tablet TAKE 1 TABLET(80 MG) BY MOUTH DAILY Patient taking differently: Take 80 mg by mouth daily. 07/27/20  Yes Turner, Eber Hong, MD  Cholecalciferol (D3-1000) 25 MCG (1000 UT) capsule Take 1,000 Units by mouth daily.   Yes [provider]  FARXIGA 10 MG TABS tablet Take  10 mg by mouth daily. 05/06/15  Yes [provider]  metFORMIN (GLUCOPHAGE) 1000 MG tablet Take 1,000 mg by mouth daily with breakfast. 06/01/17  Yes [provider]  TRULICITY 1.5 EW/2.5RK SOPN Inject 1.5 mg into the skin once a week. 06/09/20  Yes [provider]      Garner Nash, DO [facility] 09/06/2020 12:02 PM

## 2020-09-06 NOTE — Anesthesia Procedure Notes (Signed)
Arterial Line Insertion Start/End3/08/2020 11:35 AM, 09/06/2020 11:41 AM Performed by: Rande Brunt, CRNA, CRNA  Patient location: Pre-op. Preanesthetic checklist: patient identified, IV checked, site marked, risks and benefits discussed, surgical consent, monitors and equipment checked, pre-op evaluation, timeout performed and anesthesia consent Lidocaine 1% used for infiltration Right, radial was placed Catheter size: 20 G Hand hygiene performed  and maximum sterile barriers used  Allen's test indicative of satisfactory collateral circulation Attempts: 1 Procedure performed without using ultrasound guided technique. Following insertion, dressing applied and Biopatch. Post procedure assessment: normal and unchanged  Patient tolerated the procedure well with no immediate complications.

## 2020-09-06 NOTE — Transfer of Care (Signed)
Immediate Anesthesia Transfer of Care Note  Patient: Preston Gamble  Procedure(s) Performed: XI ROBOTIC ASSISTED THORASCOPY LOBECTOMY OF THE LEFT UPPER LOBE (Left Chest) INTERCOSTAL NERVE BLOCK (Left )  Patient Location: PACU  Anesthesia Type:General  Level of Consciousness: awake, alert  and oriented  Airway & Oxygen Therapy: Patient Spontanous Breathing  Post-op Assessment: Report given to RN and Post -op Vital signs reviewed and stable  Post vital signs: Reviewed and stable  Last Vitals:  Vitals Value Taken Time  BP 169/93 09/06/20 1942  Temp    Pulse 85 09/06/20 1944  Resp 18 09/06/20 1944  SpO2 100 % 09/06/20 1944  Vitals shown include unvalidated device data.  Last Pain:  Vitals:   09/06/20 1202  TempSrc:   PainSc: 0-No pain      Patients Stated Pain Goal: 2 (94/49/[id number])  Complications: No complications documented.

## 2020-09-06 NOTE — Interval H&P Note (Signed)
History and Physical Interval Note:  09/06/2020 1:18 PM  Preston Gamble  has presented today for surgery, with the diagnosis of Lung nodule.  The various methods of treatment have been discussed with the patient and family. After consideration of risks, benefits and other options for treatment, the patient has consented to  Procedure(s) with comments: [place] (Left) - biopsies and fiducial dye marking, MB + ICG as a surgical intervention.  The patient's history has been reviewed, patient examined, no change in status, stable for surgery.  I have reviewed the patient's chart and labs.  Questions were answered to the patient's satisfaction.     [place]

## 2020-09-06 NOTE — H&P (View-Only) (Signed)
NAME:  Preston Gamble, MRN:  [id number], DOB:  Nov 19, 1960, LOS: 0 ADMISSION DATE:  09/06/2020, CONSULTATION DATE: 10/05/2018 REFERRING MD: Dr. Kipp Brood, CHIEF COMPLAINT: Lung nodule  History of Present Illness:  This is a 60 year old gentleman initially seen in consultation on 07/05/2020 for abnormal CT imaging.  Patient has a past medical history of diabetes, hyperlipidemia, mitral valve prolapse, family history of coronary disease.  Patient initially had an abnormal coronary CT which led to follow-up CT imaging in December 2021 which revealed a persistent groundglass opacity.  Patient had interval increase in the subsolid groundglass nodule concerning for a primary bronchogenic carcinoma.  The initial nodule was 1 x 0.7 cm and had increased to 1.1 x 0.9 cm.  He is a former smoker that quit in 1998 he smoked for approximately 20 years.  Patient was subsequently referred to cardiothoracic surgery for consultation.  Patient was seen by Dr. Kipp Brood on 07/25/2020.  Patient had a PET scan completed in between which showed low-level uptake.  They had a discussion regarding next best steps.  After having a multidisciplinary discussion between myself and thoracic surgery and the patient we made a decision for a combination video bronchoscopy with navigation, fiducial dye marking, we also plan for biopsies if there is concern or a positive malignancy diagnosis then we will plan for transfer immediately to the operating room for lobectomy.  If needed will do wedge resection prior to lobectomy.  Patient was agreeable to this plan.  Patient presents to the hospital today to execute discussed plan for combined ENB plus robotic assisted thorascopic surgery.  Past Medical History:   Past Medical History:  Diagnosis Date  . Chest pain    due to injury  . Diabetes mellitus without complication ([place])   . Dilated aortic root ([place]) 05/27/2015   23m by Chest CTA 2021  . Family history of early CAD  05/17/2016  . Hyperlipidemia   . Mitral valve prolapse    with mild MR  . Umbilical hernia      Significant Hospital Events:  09/06/2020: ENB plus RATS  Consults:  Pulmonary   Procedures:   Significant Diagnostic Tests:   Micro Data:    Antimicrobials:     Interim History / Subjective:  Per HPI above  Objective   Blood pressure (!) 167/87, pulse 71, temperature 97.7 F (36.5 C), temperature source Oral, resp. rate 20, height 6' 2"  (1.88 m), weight 111.1 kg, SpO2 95 %.       No intake or output data in the 24 hours ending 09/06/20 1147 Filed Weights   09/06/20 1043  Weight: 111.1 kg    Examination: General: Middle-aged male comfortable in bed no distress, anxious HENT: NCAT tracking appropriately Lungs: Clear to auscultation bilaterally no crackles no wheeze Cardiovascular: Regular rate rhythm S1-S2 Abdomen: Soft nontender nondistended Extremities: No significant edema Neuro: Alert oriented following commands GU: Deferred  CT chest super D formatting 09/01/2020: Subsolid left upper lobe lung nodule concerning for primary bronchogenic carcinoma.  Resolved Hospital Problem list     Assessment & Plan:   Left upper lobe subsolid lung nodule concerning for a primary bronchogenic carcinoma Former smoker  Plan: Met perioperatively and discussed the risk benefits and alternatives of proceeding with navigational bronchoscopy tissue biopsy and fiducial dye marking prior to robotic assisted thorascopic surgery. All questions were answered. We discussed risk of bleeding and pneumothorax. Patient is agreeable to proceed. No barriers at this time.  Pulmonary appreciates the consultation   Labs  CBC: Recent Labs  Lab 09/04/20 1505  WBC 7.2  HGB 16.5  HCT 48.3  MCV 82.7  PLT 161    Basic Metabolic Panel: Recent Labs  Lab 09/04/20 1505  NA 136  K 4.1  CL 106  CO2 18*  GLUCOSE 175*  BUN 16  CREATININE 1.04  CALCIUM 9.1   GFR: Estimated  Creatinine Clearance: 104 mL/min (by C-G formula based on SCr of 1.04 mg/dL). Recent Labs  Lab 09/04/20 1505  WBC 7.2    Liver Function Tests: Recent Labs  Lab 09/04/20 1505  AST 24  ALT 40  ALKPHOS 81  BILITOT 1.8*  PROT 6.2*  ALBUMIN 4.0   No results for input(s): LIPASE, AMYLASE in the last 168 hours. No results for input(s): AMMONIA in the last 168 hours.  ABG    Component Value Date/Time   PHART 7.401 09/04/2020 1518   PCO2ART 35.9 09/04/2020 1518   PO2ART 87.3 09/04/2020 1518   HCO3 21.8 09/04/2020 1518   ACIDBASEDEF 2.2 (H) 09/04/2020 1518   O2SAT 96.9 09/04/2020 1518     Coagulation Profile: Recent Labs  Lab 09/04/20 1505  INR 1.0    Cardiac Enzymes: No results for input(s): CKTOTAL, CKMB, CKMBINDEX, TROPONINI in the last 168 hours.  HbA1C: Hgb A1c MFr Bld  Date/Time Value Ref Range Status  09/04/2020 03:06 PM 8.4 (H) 4.8 - 5.6 % Final    Comment:    (NOTE) Pre diabetes:          5.7%-6.4%  Diabetes:              >6.4%  Glycemic control for   <7.0% adults with diabetes     CBG: Recent Labs  Lab 09/04/20 1506 09/06/20 1045  GLUCAP 159* 166*     Review of Systems:    Review of Systems  Constitutional: Negative for chills, fever, malaise/fatigue and weight loss.  HENT: Negative for hearing loss, sore throat and tinnitus.   Eyes: Negative for blurred vision and double vision.  Respiratory: Negative for cough, hemoptysis, sputum production, shortness of breath, wheezing and stridor.   Cardiovascular: Negative for chest pain, palpitations, orthopnea, leg swelling and PND.  Gastrointestinal: Negative for abdominal pain, constipation, diarrhea, heartburn, nausea and vomiting.  Genitourinary: Negative for dysuria, hematuria and urgency.  Musculoskeletal: Negative for joint pain and myalgias.  Skin: Negative for itching and rash.  Neurological: Negative for dizziness, tingling, weakness and headaches.  Endo/Heme/Allergies: Negative for  environmental allergies. Does not bruise/bleed easily.  Psychiatric/Behavioral: Negative for depression. The patient is not nervous/anxious and does not have insomnia.   All other systems reviewed and are negative.    Past Medical History:  He,  has a past medical history of Chest pain, Diabetes mellitus without complication ([place]), Dilated aortic root ([place]) (05/27/2015), Family history of early CAD (05/17/2016), Hyperlipidemia, Mitral valve prolapse, and Umbilical hernia.   Surgical History:   Past Surgical History:  Procedure Laterality Date  . HERNIA REPAIR    . NECK SURGERY    . PLANTAR FASCIA RELEASE    . SEPTOPLASTY  1994  . UMBILICAL HERNIA REPAIR  07/09/2012   Procedure: HERNIA REPAIR UMBILICAL ADULT;  Surgeon: Haywood Lasso, MD;  Location: [facility];  Service: General;  Laterality: N/A;  repair umbilical hernia     Social History:   reports that he quit smoking about 23 years ago. He has never used smokeless tobacco. He reports current alcohol use of about 3.0 standard drinks of alcohol per  week. He reports that he does not use drugs.   Family History:  His family history includes CAD in his father; Diabetes in his father, maternal grandfather, maternal grandmother, paternal grandfather, paternal grandmother, and sister; Hyperlipidemia in his sister. There is no history of Colon cancer, Pancreatic cancer, or Stomach cancer.   Allergies No Known Allergies   Home Medications  Prior to Admission medications   Medication Sig Start Date End Date Taking? Authorizing Provider  aspirin EC 81 MG tablet Take 81 mg by mouth daily.   Yes [provider]  atorvastatin (LIPITOR) 80 MG tablet TAKE 1 TABLET(80 MG) BY MOUTH DAILY Patient taking differently: Take 80 mg by mouth daily. 07/27/20  Yes Turner, Eber Hong, MD  Cholecalciferol (D3-1000) 25 MCG (1000 UT) capsule Take 1,000 Units by mouth daily.   Yes [provider]  FARXIGA 10 MG TABS tablet Take  10 mg by mouth daily. 05/06/15  Yes [provider]  metFORMIN (GLUCOPHAGE) 1000 MG tablet Take 1,000 mg by mouth daily with breakfast. 06/01/17  Yes [provider]  TRULICITY 1.5 HS/3.7FJ SOPN Inject 1.5 mg into the skin once a week. 06/09/20  Yes [provider]      Garner Nash, DO [facility] 09/06/2020 12:02 PM

## 2020-09-06 NOTE — Anesthesia Procedure Notes (Signed)
Procedure Name: Intubation Date/Time: 09/06/2020 3:37 PM Performed by: Moshe Salisbury, CRNA Pre-anesthesia Checklist: Patient identified, Emergency Drugs available, Suction available and Patient being monitored Patient Re-evaluated:Patient Re-evaluated prior to induction Oxygen Delivery Method: Circle System Utilized Preoxygenation: Pre-oxygenation with 100% oxygen Induction Type: IV induction Ventilation: Mask ventilation without difficulty Laryngoscope Size: Mac and 4 Grade View: Grade II Tube type: Oral Tube size: 8.5 mm Number of attempts: 1 Airway Equipment and Method: Stylet Placement Confirmation: ETT inserted through vocal cords under direct vision,  positive ETCO2 and breath sounds checked- equal and bilateral Secured at: 22 cm Tube secured with: Tape Dental Injury: Teeth and Oropharynx as per pre-operative assessment

## 2020-09-07 ENCOUNTER — Inpatient Hospital Stay (HOSPITAL_COMMUNITY): Payer: BC Managed Care – PPO

## 2020-09-07 ENCOUNTER — Encounter (HOSPITAL_COMMUNITY): Payer: Self-pay | Admitting: Thoracic Surgery (Cardiothoracic Vascular Surgery)

## 2020-09-07 LAB — CBC
HCT: 41.7 % (ref 39.0–52.0)
Hemoglobin: 14.2 g/dL (ref 13.0–17.0)
MCH: 28.1 pg (ref 26.0–34.0)
MCHC: 34.1 g/dL (ref 30.0–36.0)
MCV: 82.6 fL (ref 80.0–100.0)
Platelets: 219 10*3/uL (ref 150–400)
RBC: 5.05 MIL/uL (ref 4.22–5.81)
RDW: 12.8 % (ref 11.5–15.5)
WBC: 11.3 10*3/uL — ABNORMAL HIGH (ref 4.0–10.5)
nRBC: 0 % (ref 0.0–0.2)

## 2020-09-07 LAB — GLUCOSE, CAPILLARY
Glucose-Capillary: 115 mg/dL — ABNORMAL HIGH (ref 70–99)
Glucose-Capillary: 180 mg/dL — ABNORMAL HIGH (ref 70–99)

## 2020-09-07 LAB — BASIC METABOLIC PANEL
Anion gap: 11 (ref 5–15)
BUN: 15 mg/dL (ref 6–20)
CO2: 20 mmol/L — ABNORMAL LOW (ref 22–32)
Calcium: 8.3 mg/dL — ABNORMAL LOW (ref 8.9–10.3)
Chloride: 104 mmol/L (ref 98–111)
Creatinine, Ser: 1.04 mg/dL (ref 0.61–1.24)
GFR, Estimated: >60 mL/min (ref >60)
Glucose, Bld: 159 mg/dL — ABNORMAL HIGH (ref 70–99)
Potassium: 4.1 mmol/L (ref 3.5–5.1)
Sodium: 135 mmol/L (ref 135–145)

## 2020-09-07 MED ORDER — PHENAZOPYRIDINE HCL 200 MG PO TABS
200.0000 mg | ORAL_TABLET | Freq: Three times a day (TID) | ORAL | Status: DC
  Administered 2020-09-07 (×2): 200 mg via ORAL
  Filled 2020-09-07 (×3): qty 1

## 2020-09-07 MED ORDER — PHENAZOPYRIDINE HCL 200 MG PO TABS: 200.0000 mg | ORAL_TABLET | Freq: Three times a day (TID) | ORAL | 0 refills | Status: DC | PRN

## 2020-09-07 MED ORDER — TRAMADOL HCL 50 MG PO TABS: 50.0000 mg | ORAL_TABLET | Freq: Four times a day (QID) | ORAL | 0 refills | Status: DC | PRN

## 2020-09-07 MED ORDER — TRAMADOL HCL 50 MG PO TABS: 50.0000 mg | ORAL_TABLET | Freq: Four times a day (QID) | ORAL | 0 refills | Status: AC | PRN

## 2020-09-07 MED ORDER — PHENAZOPYRIDINE HCL 200 MG PO TABS
200.0000 mg | ORAL_TABLET | Freq: Three times a day (TID) | ORAL | Status: DC
  Filled 2020-09-07: qty 1

## 2020-09-07 MED ORDER — ATORVASTATIN CALCIUM 80 MG PO TABS: 80.0000 mg | ORAL_TABLET | Freq: Every day | ORAL | Status: DC

## 2020-09-07 NOTE — Progress Notes (Addendum)
      [address]       [place] [address]             [phone]      1 Day Post-Op Procedure(s) (LRB): XI ROBOTIC ASSISTED THORASCOPY LOBECTOMY OF THE LEFT UPPER LOBE (Left) INTERCOSTAL NERVE BLOCK (Left) Subjective: Awake and alert, sitting up on side of his bed. Says pain has been tolerable but has been having dysuria since the foley catheter was removed.   Objective: Vital signs in last 24 hours: Temp:  [97 F (36.1 C)-98.6 F (37 C)] 98.1 F (36.7 C) (03/03 0700) Pulse Rate:  [66-87] 66 (03/03 0700) Cardiac Rhythm: Normal sinus rhythm (03/03 0700) Resp:  [9-21] 14 (03/03 0700) BP: (113-176)/(66-97) 117/75 (03/03 0700) SpO2:  [92 %-99 %] 95 % (03/03 0700) Arterial Line BP: (160-170)/(76-77) 170/77 (03/02 2000) Weight:  [111.1 kg-112.9 kg] 112.9 kg (03/02 2247)     Intake/Output from previous day: 03/02 0701 - 03/03 0700 In: 1580 [P.O.:480; I.V.:1000; IV Piggyback:100] Out: 1360 [Urine:700; Chest Tube:660] Intake/Output this shift: No intake/output data recorded.  General appearance: alert, cooperative and mild distress Neurologic: intact Heart: NSR Lungs: Breath sounds clear, shallow. Bloody drainage in the CT tubing. Small air leak. CXR has been done but result and image are not yet accessable.  Wound: The left chest port incisions are approximated and dry.   Lab Results: Recent Labs    09/04/20 1505 09/07/20 0125  WBC 7.2 11.3*  HGB 16.5 14.2  HCT 48.3 41.7  PLT 233 219   BMET:  Recent Labs    09/04/20 1505 09/07/20 0125  NA 136 135  K 4.1 4.1  CL 106 104  CO2 18* 20*  GLUCOSE 175* 159*  BUN 16 15  CREATININE 1.04 1.04  CALCIUM 9.1 8.3*    PT/INR:  Recent Labs    09/04/20 1505  LABPROT 12.8  INR 1.0   ABG    Component Value Date/Time   PHART 7.401 09/04/2020 1518   HCO3 21.8 09/04/2020 1518   ACIDBASEDEF 2.2 (H) 09/04/2020 1518   O2SAT 96.9 09/04/2020 1518   CBG (last 3)  Recent Labs    09/06/20 1945  09/06/20 2356 09/07/20 0600  GLUCAP 201* 168* 115*    Assessment/Plan: S/P Procedure(s) (LRB): XI ROBOTIC ASSISTED THORASCOPY LOBECTOMY OF THE LEFT UPPER LOBE (Left) INTERCOSTAL NERVE BLOCK (Left)  -POD1  robot-assisted left upper lobectomy for adenocarcinoma.  Respiratory status stable and pain control adequate. Leave the CT on water seal. Check the CXR when available. Mobilize.   -Dysuria- pyridium for 48 hours.   -Type 2 DM- on Farxiga, Trulicity, and metformin prior to admission. Glucose 120-200 since surgery. Resuming diet today, will continue SSI for now and re-start his home therapy as required.   -DVT PPX- mobilize, to begin SQ enoxaparin this PM.    LOS: 1 day    Antony Odea, PA-C [phone] 09/07/2020   Agree with above. Only small titling noted on Pleur-evac. We will remove chest tube and DC home today.  Harrell Bary Leriche

## 2020-09-07 NOTE — Plan of Care (Signed)
  Problem: Clinical Measurements: Goal: Respiratory complications will improve Outcome: Progressing   Problem: Pain Managment: Goal: General experience of comfort will improve Outcome: Progressing   Problem: Cardiac: Goal: Will achieve and/or maintain hemodynamic stability Outcome: Progressing   Problem: Activity: Goal: Risk for activity intolerance will decrease Outcome: Progressing   Problem: Respiratory: Goal: Respiratory status will improve Outcome: Progressing

## 2020-09-07 NOTE — Progress Notes (Signed)
      [address]       [place] [address]             [phone]       Re-sent medications to [facility] on [address] for the patient since the original [facility] is having issues with their computer system.    Nicholes Rough, PA-C

## 2020-09-07 NOTE — Hospital Course (Signed)
Hospital Course: A line and foley were removed following surgery. Patient remained afebrile and hemodynamically stable. Chest tube remained to water seal and there was a small air leak. Chest tube was removed on 03/**. Patient has a history of diabetes and once he was tolerating a diet, Farxiga, Trulicity, and Metformin were resumed. He was ambulating on room air with good oxygenation. Wounds are clean, dry, and healing without signs of infection.

## 2020-09-08 LAB — CYTOLOGY - NON PAP

## 2020-09-08 LAB — SURGICAL PATHOLOGY

## 2020-09-08 NOTE — Anesthesia Postprocedure Evaluation (Signed)
Anesthesia Post Note  Patient: Preston Gamble  Procedure(s) Performed: XI ROBOTIC ASSISTED THORASCOPY LOBECTOMY OF THE LEFT UPPER LOBE (Left Chest) INTERCOSTAL NERVE BLOCK (Left )     Patient location during evaluation: PACU Anesthesia Type: General Level of consciousness: awake and alert Pain management: pain level controlled Vital Signs Assessment: post-procedure vital signs reviewed and stable Respiratory status: spontaneous breathing, nonlabored ventilation, respiratory function stable and patient connected to nasal cannula oxygen Cardiovascular status: blood pressure returned to baseline and stable Postop Assessment: no apparent nausea or vomiting Anesthetic complications: no   No complications documented.  Last Vitals:  Vitals:   09/07/20 0500 09/07/20 0700  BP: 113/72 117/75  Pulse:  66  Resp: 13 14  Temp:  36.7 C  SpO2:  95%    Last Pain:  Vitals:   09/07/20 1033  TempSrc:   PainSc: 7                  [name] S

## 2020-09-12 ENCOUNTER — Encounter: Payer: Self-pay | Admitting: Gastroenterology

## 2020-09-14 ENCOUNTER — Other Ambulatory Visit: Payer: Self-pay | Admitting: *Deleted

## 2020-09-14 NOTE — Progress Notes (Signed)
The proposed treatment discussed in cancer conference is for discussion purpose only and is not a binding recommendation. The patient was not physically examined nor present for their treatment options. Therefore, final treatment plans cannot be decided.  ?

## 2020-09-15 ENCOUNTER — Ambulatory Visit (INDEPENDENT_AMBULATORY_CARE_PROVIDER_SITE_OTHER): Payer: Self-pay | Admitting: Thoracic Surgery (Cardiothoracic Vascular Surgery)

## 2020-09-15 ENCOUNTER — Encounter: Payer: Self-pay | Admitting: Thoracic Surgery (Cardiothoracic Vascular Surgery)

## 2020-09-15 ENCOUNTER — Other Ambulatory Visit: Payer: Self-pay

## 2020-09-15 VITALS — BP 140/82 | HR 86 | Resp 20 | Ht 74.0 in | Wt 243.0 lb

## 2020-09-15 DIAGNOSIS — R911 Solitary pulmonary nodule: Secondary | ICD-10-CM

## 2020-09-15 DIAGNOSIS — Z09 Encounter for follow-up examination after completed treatment for conditions other than malignant neoplasm: Secondary | ICD-10-CM

## 2020-09-15 NOTE — Progress Notes (Signed)
[address]       [place] [address]             [phone]        Preston Gamble [facility] Medical Record #[id number] Date of Birth: 26-Jul-1960  Referring: Garner Nash, DO Primary Care: Vivi Barrack, MD Primary Cardiologist:Traci Radford Pax, MD  Reason for visit:   follow-up  History of Present Illness:     Mr. Preston Gamble comes in for his 1 week appointment.  Overall he is doing well.  He is walking about 2 miles every day without any shortness of breath.  Physical Exam: BP 140/82   Pulse 86   Resp 20   Ht 6\' 2"  (1.88 m)   Wt 243 lb (110.2 kg)   SpO2 97% Comment: RA  BMI 31.20 kg/m   Alert NAD Incision clean.   Abdomen soft, ND No peripheral edema   Diagnostic Studies & Laboratory data:  Path:  FINAL MICROSCOPIC DIAGNOSIS:   A. LUNG, LEFT UPPER LOBE, WEDGE RESECTION:  - Invasive adenocarcinoma, 1.1 cm, acinar predominant  - Visceral pleura is not involved  - Resection margin is negative for carcinoma  - See oncology table   B. LYMPH NODE, 6, EXCISION:  - Lymph node, negative for carcinoma (0/1)   C. LYMPH NODE, 6 #2, EXCISION:  - Lymph node, negative for carcinoma (0/1)   D. LYMPH NODE, 6 #3, EXCISION:  - Lymph node, negative for carcinoma (0/1)   E. LYMPH NODE, HILAR #1, EXCISION:  - Lymph node, negative for carcinoma (0/1)   F. LYMPH NODE, HILAR #2, EXCISION:  - Lymph node, negative for carcinoma (0/1)   G. LYMPH NODE, HILAR #3, EXCISION:  - Lymph node, negative for carcinoma (0/1)   H. LYMPH NODE, HILAR #4, EXCISION:  - Lymph node, negative for carcinoma (0/1)   I. LYMPH NODE, HILAR #5, EXCISION:  - Lymph node, negative for carcinoma (0/1)   J. LUNG, LEFT UPPER LOBE, LOBECTOMY:  - Benign lung parenchyma, negative for carcinoma  - Lymph node, negative for carcinoma (0/1)      ONCOLOGY TABLE:   LUNG: Resection   Synchronous Tumors: Not applicable  Total Number of Primary Tumors: 1  Procedure: Lung,  lobectomy  Specimen Laterality: Left  Tumor Focality: Unifocal  Tumor Site: Upper lobe  Tumor Size: 1.1 cm    Total Tumor Size: 1.1 cm    Invasive Tumor Size (applies only to invasive nonmucinous  adenocarcinoma with a lepidic       component): 0.8 cm  Histologic Type: Adenocarcinoma, acinar predominant  Visceral Pleura Invasion: Not identified  Direct Invasion of Adjacent Structures: No adjacent structures present  Lymphovascular Invasion: Not identified  Margins: All margins negative for invasive carcinoma    Closest Margin(s) to Invasive Carcinoma: Bronchovascular margin    Margin(s) Involved by Invasive Carcinoma: Not applicable     Margin Status for Non-Invasive Tumor: Not applicable  Treatment Effect: No known presurgical therapy  Regional Lymph Nodes:    Number of Lymph Nodes Involved: 0    Number of Lymph Nodes Examined: 9            Nodal Sites Examined: Level 6 and 10  Distant Metastasis:    Distant Site(s) Involved: Not applicable  Pathologic Stage Classification (pTNM, AJCC 8th Edition): pT1a, pN0     Assessment / Plan:   60 year old male status post left upper lobectomy for stage Ia T1 a N0 M0 adenocarcinoma  the lung.  Overall he is doing quite well.  His case has been presented at tumor board and he does not need any adjuvant therapy. I will see him back in 1 month with a chest x-ray.   Lajuana Matte 09/15/2020 4:49 PM

## 2020-10-18 ENCOUNTER — Other Ambulatory Visit: Payer: Self-pay | Admitting: Thoracic Surgery (Cardiothoracic Vascular Surgery)

## 2020-10-18 DIAGNOSIS — R911 Solitary pulmonary nodule: Secondary | ICD-10-CM

## 2020-10-18 DIAGNOSIS — I77810 Thoracic aortic ectasia: Secondary | ICD-10-CM

## 2020-10-19 ENCOUNTER — Other Ambulatory Visit: Payer: Self-pay

## 2020-10-19 ENCOUNTER — Ambulatory Visit
Admission: RE | Admit: 2020-10-19 | Discharge: 2020-10-19 | Disposition: A | Discharge status: Home / Self Care | Payer: BC Managed Care – PPO | Source: Ambulatory Visit | Attending: Surgery | Admitting: Surgery

## 2020-10-19 ENCOUNTER — Ambulatory Visit (INDEPENDENT_AMBULATORY_CARE_PROVIDER_SITE_OTHER): Payer: Self-pay | Admitting: Physician Assistant

## 2020-10-19 VITALS — BP 100/69 | HR 64 | Temp 97.9°F | Resp 20 | Wt 238.0 lb

## 2020-10-19 DIAGNOSIS — R911 Solitary pulmonary nodule: Secondary | ICD-10-CM

## 2020-10-19 DIAGNOSIS — Z9889 Other specified postprocedural states: Secondary | ICD-10-CM | POA: Diagnosis not present

## 2020-10-19 DIAGNOSIS — I77810 Thoracic aortic ectasia: Secondary | ICD-10-CM

## 2020-10-19 DIAGNOSIS — R918 Other nonspecific abnormal finding of lung field: Secondary | ICD-10-CM | POA: Diagnosis not present

## 2020-10-19 NOTE — Progress Notes (Signed)
HPI: Patient returns 1 month follow up for Robotic Assisted Wedge Resection for Adenocarcinoma.  He was last seen by Dr. Kipp Brood on 09/15/2020.  Overall he continues to do well.  He is ambulating 3-4 miles per day.  He notices he still struggles attempting to walk up an incline, but he continues to attempt this 4-5 times per day.  He is now able to sleep on his left side, but he continues to have some discomfort there.  He denies paraesthesias and neuropathic pain along his side.   Current Outpatient Medications  Medication Sig Dispense Refill  . aspirin EC 81 MG tablet Take 81 mg by mouth daily.    Marland Kitchen atorvastatin (LIPITOR) 80 MG tablet Take 1 tablet (80 mg total) by mouth daily.    . Cholecalciferol (D3-1000) 25 MCG (1000 UT) capsule Take 1,000 Units by mouth daily.    Marland Kitchen FARXIGA 10 MG TABS tablet Take 10 mg by mouth daily.  7  . metFORMIN (GLUCOPHAGE) 1000 MG tablet Take 1,000 mg by mouth daily with breakfast.  5  . phenazopyridine (PYRIDIUM) 200 MG tablet Take 1 tablet (200 mg total) by mouth 3 (three) times daily as needed for pain. for urinary tract discomfort only 6 tablet 0  . TRULICITY 1.5 MH/9.6QI SOPN Inject 1.5 mg into the skin once a week.     No current facility-administered medications for this visit.    Physical Exam: BP 100/69 (BP Location: Left Arm, Patient Position: Sitting, Cuff Size: Normal)   Pulse 64   Temp 97.9 F (36.6 C) (Skin)   Resp 20   Wt 238 lb (108 kg)   SpO2 95% Comment: RA  BMI 30.56 kg/m   Gen: no apparent distress Heart: RRR Lungs: CTA bilaterally Incisions: well healed  Diagnostic Tests:  CXR: stable appearance of CXR, no evidence of pneumothorax   A/P:  1. S/P Robotic Assisted VATS with wedge resection for Adenocarcinoma of the LUL- doing well.  His endurance continues to improve.  He does struggle walking up an incline, which should improve with time.  He denies post operative paraesthesias and neuropathic pain 2. Dispo- patient doing  well.  He will likely require a CT scan in 6 months to 1 year.  This will be done by Dr. Kipp Brood or Dr. Valeta Harms.. I will discuss with Dr. Kipp Brood and we will contact patient to arrange future follow up.  He was instructed to contact our office should he have issues.   Ellwood Handler, PA-C [facility] [phone]

## 2020-10-20 ENCOUNTER — Ambulatory Visit: Payer: BC Managed Care – PPO | Admitting: Thoracic Surgery (Cardiothoracic Vascular Surgery)

## 2020-11-17 ENCOUNTER — Telehealth: Payer: Self-pay | Admitting: *Deleted

## 2020-11-17 ENCOUNTER — Other Ambulatory Visit: Payer: Self-pay

## 2020-11-17 ENCOUNTER — Ambulatory Visit (AMBULATORY_SURGERY_CENTER): Payer: Self-pay | Admitting: *Deleted

## 2020-11-17 VITALS — Ht 74.0 in | Wt 239.0 lb

## 2020-11-17 DIAGNOSIS — Z86010 Personal history of colonic polyps: Secondary | ICD-10-CM

## 2020-11-17 MED ORDER — PEG 3350-KCL-NA BICARB-NACL 420 G PO SOLR: 4000.0000 mL | Freq: Once | ORAL | 0 refills | Status: AC

## 2020-11-17 NOTE — Progress Notes (Signed)
  No trouble with anesthesia, denies being told they were difficult to intubate, or hx/fam hx of malignant hyperthermia per pt   No egg or soy allergy  No home oxygen use   No medications for weight loss taken  Pt denies constipation issues  Pt informed that we do not do prior authorizations for prep

## 2020-11-17 NOTE — Telephone Encounter (Signed)
Thanks.  I reviewed his chart.  Looks OK for direct colonsocopy in [place].

## 2020-11-17 NOTE — Telephone Encounter (Signed)
Dr. Ardis Hughs,  I saw this pt for his PV this am.  His colonoscopy is 12-01-20.  He was diagnosed with lung cancer in December 2021 and in March 2022 he had a Robotic Assisted Wedge Resection for Adenocarcinoma.  That is the only treatment he had for his cancer and is having no issues.  Per our PV protocol, we are to check with you if a patient has been diagnosed with cancer within the past year to make sure you are ok with proceeding with scheduled colonoscopy.  Please advise.  Thanks, [employer]

## 2020-11-17 NOTE — Telephone Encounter (Signed)
noted 

## 2020-11-20 DIAGNOSIS — E785 Hyperlipidemia, unspecified: Secondary | ICD-10-CM | POA: Diagnosis not present

## 2020-11-20 DIAGNOSIS — E559 Vitamin D deficiency, unspecified: Secondary | ICD-10-CM | POA: Diagnosis not present

## 2020-11-20 DIAGNOSIS — E1165 Type 2 diabetes mellitus with hyperglycemia: Secondary | ICD-10-CM | POA: Diagnosis not present

## 2020-11-20 LAB — BASIC METABOLIC PANEL
BUN: 15 (ref 4–21)
CO2: 18 (ref 13–22)
Chloride: 102 (ref 99–108)
Creatinine: 1.1 (ref 0.6–1.3)
Glucose: 188
Potassium: 4.6 (ref 3.4–5.3)
Sodium: 139 (ref 137–147)

## 2020-11-20 LAB — COMPREHENSIVE METABOLIC PANEL
Albumin: 4.8 (ref 3.5–5.0)
Calcium: 9.8 (ref 8.7–10.7)
GFR calc non Af Amer: 82
Globulin: 2.2

## 2020-11-20 LAB — LIPID PANEL
Cholesterol: 138 (ref 0–200)
HDL: 41 (ref 35–70)
LDL Cholesterol: 72
Triglycerides: 144 (ref 40–160)

## 2020-11-20 LAB — HEPATIC FUNCTION PANEL
ALT: 34 (ref 10–40)
AST: 22 (ref 14–40)
Alkaline Phosphatase: 126 — AB (ref 25–125)
Bilirubin, Total: 1.1

## 2020-11-20 LAB — PSA: PSA: 0.8

## 2020-11-20 LAB — CBC: RBC: 6.36 — AB (ref 3.87–5.11)

## 2020-11-20 LAB — VITAMIN D 25 HYDROXY (VIT D DEFICIENCY, FRACTURES): Vit D, 25-Hydroxy: 31.2

## 2020-11-20 LAB — CBC AND DIFFERENTIAL
HCT: 52 (ref 41–53)
Hemoglobin: 17.3 (ref 13.5–17.5)
WBC: 6.1

## 2020-11-22 DIAGNOSIS — E559 Vitamin D deficiency, unspecified: Secondary | ICD-10-CM | POA: Diagnosis not present

## 2020-11-22 DIAGNOSIS — E1165 Type 2 diabetes mellitus with hyperglycemia: Secondary | ICD-10-CM | POA: Diagnosis not present

## 2020-11-22 DIAGNOSIS — E785 Hyperlipidemia, unspecified: Secondary | ICD-10-CM | POA: Diagnosis not present

## 2020-11-22 DIAGNOSIS — Z6832 Body mass index (BMI) 32.0-32.9, adult: Secondary | ICD-10-CM | POA: Diagnosis not present

## 2020-11-30 ENCOUNTER — Encounter: Payer: Self-pay | Admitting: Certified Registered Nurse Anesthetist

## 2020-12-01 ENCOUNTER — Other Ambulatory Visit: Payer: Self-pay

## 2020-12-01 ENCOUNTER — Encounter: Payer: Self-pay | Admitting: Gastroenterology

## 2020-12-01 ENCOUNTER — Ambulatory Visit (AMBULATORY_SURGERY_CENTER): Payer: BC Managed Care – PPO | Admitting: Gastroenterology

## 2020-12-01 VITALS — BP 113/70 | HR 62 | Temp 97.3°F | Resp 16 | Ht 74.0 in | Wt 239.0 lb

## 2020-12-01 DIAGNOSIS — Z86010 Personal history of colonic polyps: Secondary | ICD-10-CM | POA: Diagnosis not present

## 2020-12-01 DIAGNOSIS — Z1211 Encounter for screening for malignant neoplasm of colon: Secondary | ICD-10-CM | POA: Diagnosis not present

## 2020-12-01 MED ORDER — SODIUM CHLORIDE 0.9 % IV SOLN
500.0000 mL | Freq: Once | INTRAVENOUS | Status: DC
Start: 2020-12-01 — End: 2020-12-01

## 2020-12-01 NOTE — Progress Notes (Signed)
VS  By [place]   Pt's states no medical or surgical changes since previsit or office visit.

## 2020-12-01 NOTE — Patient Instructions (Signed)
No polyps!!! Next colonoscopy- 7 years  Please read over handouts about hemorrhoids and diverticulosis  Continue your normal medications  YOU HAD AN ENDOSCOPIC PROCEDURE TODAY AT [facility]:   Refer to the procedure report that was given to you for any specific questions about what was found during the examination.  If the procedure report does not answer your questions, please call your gastroenterologist to clarify.  If you requested that your care partner not be given the details of your procedure findings, then the procedure report has been included in a sealed envelope for you to review at your convenience later.  YOU SHOULD EXPECT: Some feelings of bloating in the abdomen. Passage of more gas than usual.  Walking can help get rid of the air that was put into your GI tract during the procedure and reduce the bloating. If you had a lower endoscopy (such as a colonoscopy or flexible sigmoidoscopy) you may notice spotting of blood in your stool or on the toilet paper. If you underwent a bowel prep for your procedure, you may not have a normal bowel movement for a few days.  Please Note:  You might notice some irritation and congestion in your nose or some drainage.  This is from the oxygen used during your procedure.  There is no need for concern and it should clear up in a day or so.  SYMPTOMS TO REPORT IMMEDIATELY:   Following lower endoscopy (colonoscopy or flexible sigmoidoscopy):  Excessive amounts of blood in the stool  Significant tenderness or worsening of abdominal pains  Swelling of the abdomen that is new, acute  Fever of 100F or higher  For urgent or emergent issues, a gastroenterologist can be reached at any hour by calling [phone]. Do not use MyChart messaging for urgent concerns.    DIET:  We do recommend a small meal at first, but then you may proceed to your regular diet.  Drink plenty of fluids but you should avoid alcoholic beverages for 24  hours.  ACTIVITY:  You should plan to take it easy for the rest of today and you should NOT DRIVE or use heavy machinery until tomorrow (because of the sedation medicines used during the test).    FOLLOW UP: Our staff will call the number listed on your records 48-72 hours following your procedure to check on you and address any questions or concerns that you may have regarding the information given to you following your procedure. If we do not reach you, we will leave a message.  We will attempt to reach you two times.  During this call, we will ask if you have developed any symptoms of COVID 19. If you develop any symptoms (ie: fever, flu-like symptoms, shortness of breath, cough etc.) before then, please call [phone].  If you test positive for Covid 19 in the 2 weeks post procedure, please call and report this information to [place].     SIGNATURES/CONFIDENTIALITY: You and/or your care partner have signed paperwork which will be entered into your electronic medical record.  These signatures attest to the fact that that the information above on your After Visit Summary has been reviewed and is understood.  Full responsibility of the confidentiality of this discharge information lies with you and/or your care-partner.

## 2020-12-01 NOTE — Op Note (Signed)
[facility] Patient Name: Preston Gamble Procedure Date: 12/01/2020 8:23 AM MRN: [id number] Endoscopist: Milus Banister , MD Age: 60 Referring MD:  Date of Birth: 15-Jun-1961 Gender: Male Account #: [id number] Procedure:                Colonoscopy Indications:              High risk colon cancer surveillance: Personal                            history of colonic polyps; Colonoscopy 2014 several                            adenomatous polyps, Colonsocopy 2017 two SSPs Medicines:                Monitored Anesthesia Care Procedure:                Pre-Anesthesia Assessment:                           - Prior to the procedure, a History and Physical                            was performed, and patient medications and                            allergies were reviewed. The patient's tolerance of                            previous anesthesia was also reviewed. The risks                            and benefits of the procedure and the sedation                            options and risks were discussed with the patient.                            All questions were answered, and informed consent                            was obtained. Prior Anticoagulants: The patient has                            taken no previous anticoagulant or antiplatelet                            agents. ASA Grade Assessment: II - A patient with                            mild systemic disease. After reviewing the risks                            and benefits, the patient was deemed in  satisfactory condition to undergo the procedure.                           After obtaining informed consent, the colonoscope                            was passed under direct vision. Throughout the                            procedure, the patient's blood pressure, pulse, and                            oxygen saturations were monitored continuously. The                            Olympus  [id number]) [id number] was introduced                            through the anus and advanced to the the cecum,                            identified by appendiceal orifice and ileocecal                            valve. The colonoscopy was performed without                            difficulty. The patient tolerated the procedure                            well. The quality of the bowel preparation was                            good. The ileocecal valve, appendiceal orifice, and                            rectum were photographed. Scope In: 8:27:09 AM Scope Out: 8:45:16 AM Scope Withdrawal Time: 0 hours 12 minutes 10 seconds  Total Procedure Duration: 0 hours 18 minutes 7 seconds  Findings:                 Multiple small and large-mouthed diverticula were                            found in the entire colon.                           Internal hemorrhoids were found. The hemorrhoids                            were small.                           The exam was otherwise without abnormality on  direct and retroflexion views. Complications:            No immediate complications. Estimated blood loss:                            None. Estimated Blood Loss:     Estimated blood loss: none. Impression:               - Diverticulosis in the entire examined colon.                           - Internal hemorrhoids.                           - The examination was otherwise normal on direct                            and retroflexion views.                           - No specimens collected. Recommendation:           - Patient has a contact number available for                            emergencies. The signs and symptoms of potential                            delayed complications were discussed with the                            patient. Return to normal activities tomorrow.                            Written discharge instructions were provided to the                             patient.                           - Resume previous diet.                           - Continue present medications.                           - Repeat colonoscopy in 7 years for surveillance. Milus Banister, MD 12/01/2020 8:48:59 AM This report has been signed electronically.

## 2020-12-01 NOTE — Progress Notes (Signed)
Report given to PACU, vss 

## 2020-12-05 ENCOUNTER — Telehealth: Payer: Self-pay | Admitting: *Deleted

## 2020-12-05 NOTE — Telephone Encounter (Signed)
  Follow up Call-  Call back number 12/01/2020  Post procedure Call Back phone  # [phone]  Permission to leave phone message Yes  Some recent data might be hidden     No answer at 2nd attempt follow up phone call.  Left message on voicemail.

## 2020-12-05 NOTE — Telephone Encounter (Signed)
  Follow up Call-  Call back number 12/01/2020  Post procedure Call Back phone  # [phone]  Permission to leave phone message Yes  Some recent data might be hidden    [facility]

## 2020-12-20 ENCOUNTER — Encounter: Payer: Self-pay | Admitting: Family Medicine

## 2021-01-17 ENCOUNTER — Other Ambulatory Visit: Payer: Self-pay

## 2021-01-17 ENCOUNTER — Encounter: Payer: Self-pay | Admitting: Family Medicine

## 2021-01-17 ENCOUNTER — Ambulatory Visit (INDEPENDENT_AMBULATORY_CARE_PROVIDER_SITE_OTHER): Payer: BC Managed Care – PPO | Admitting: Family Medicine

## 2021-01-17 VITALS — BP 110/70 | HR 65 | Temp 97.6°F | Ht 74.0 in | Wt 240.4 lb

## 2021-01-17 DIAGNOSIS — E119 Type 2 diabetes mellitus without complications: Secondary | ICD-10-CM

## 2021-01-17 DIAGNOSIS — E1169 Type 2 diabetes mellitus with other specified complication: Secondary | ICD-10-CM

## 2021-01-17 DIAGNOSIS — Z23 Encounter for immunization: Secondary | ICD-10-CM | POA: Diagnosis not present

## 2021-01-17 DIAGNOSIS — E785 Hyperlipidemia, unspecified: Secondary | ICD-10-CM

## 2021-01-17 DIAGNOSIS — C3490 Malignant neoplasm of unspecified part of unspecified bronchus or lung: Secondary | ICD-10-CM | POA: Diagnosis not present

## 2021-01-17 MED ORDER — DICLOFENAC SODIUM 75 MG PO TBEC: 75.0000 mg | DELAYED_RELEASE_TABLET | Freq: Two times a day (BID) | ORAL | 0 refills | Status: DC

## 2021-01-17 NOTE — Addendum Note (Signed)
Addended by: Betti Cruz on: 01/17/2021 09:36 AM   Modules accepted: Orders

## 2021-01-17 NOTE — Assessment & Plan Note (Signed)
Follows with endocrinology.  Last A1c was at goal.  We will be taking over his diabetes management early next year.

## 2021-01-17 NOTE — Assessment & Plan Note (Signed)
On Lipitor 80 mg daily.

## 2021-01-17 NOTE — Assessment & Plan Note (Signed)
Doing well status post resection.  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  Follows with pulmonology and cardiothoracic surgery.

## 2021-01-17 NOTE — Patient Instructions (Signed)
It was very nice to see you today!  I think you have some inflammation in your rotator cuff.  Please work on a home exercises and take the diclofenac.  Let us know if not improving over the next few weeks and we can talk about seeing a physical therapist or getting imaging.  Take care, Dr Jerline Pain  PLEASE NOTE:  If you had any lab tests please let us know if you have not heard back within a few days. You may see your results on mychart before we have a chance to review them but we will give you a call once they are reviewed by [place]. If we ordered any referrals today, please let us know if you have not heard from their office within the next week.   Please try these tips to maintain a healthy lifestyle:  Eat at least 3 REAL meals and 1-2 snacks per day.  Aim for no more than 5 hours between eating.  If you eat breakfast, please do so within one hour of getting up.   Each meal should contain half fruits/vegetables, one quarter protein, and one quarter carbs (no bigger than a computer mouse)  Cut down on sweet beverages. This includes juice, soda, and sweet tea.   Drink at least 1 glass of water with each meal and aim for at least 8 glasses per day  Exercise at least 150 minutes every week.

## 2021-01-17 NOTE — Progress Notes (Signed)
   Preston Gamble is a 60 y.o. male who presents today for an office visit.  Assessment/Plan:  New/Acute Problems: Right shoulder pain Exam consistent with rotator cuff impingement.  Discussed conservative management.  Discussed home exercises and handout was given.  We will start diclofenac 75 mg twice daily for the next couple of weeks.  If not improving will consider referral to physical therapy.  If continues to be an issue will need imaging to rule out tear or other pathology - deferred for today.  Discussed reasons to return to care.  Chronic Problems Addressed Today: Dyslipidemia associated with type 2 diabetes mellitus (HCC) On Lipitor 80 mg daily.  Diabetes mellitus without complication ([place]) Follows with endocrinology.  Last A1c was at goal.  We will be taking over his diabetes management early next year.  Lung cancer [facility]) s/p resection 09/2020 Doing well status post resection.  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  Follows with pulmonology and cardiothoracic surgery.     Subjective:  HPI: CC of the patient is he has difficulty moving his right arm. 5 years ago he had a disintegrating disc that was causing issues with his left arm, the doctor at the time stated that he would likely have problems with his right arm in the next 5-6 years.    During the last two months his right arm has developed this issue. He admits to experiencing pain followed by tingling with lifting his arm up, and other movements around rotator cuff.  He denies any PMHx of notable side effects taking anti-inflammatory medication.  He had lung surgery to remove part of his lung due to cancer 08/29/20. He is compliant with all medication with no notable side effects.         Objective:  Physical Exam: BP 110/70   Pulse 65   Temp 97.6 F (36.4 C) (Temporal)   Ht 6\' 2"  (1.88 m)   Wt 240 lb 6.4 oz (109 kg)   SpO2 96%   BMI 30.87 kg/m   Gen: No acute distress, resting comfortably CV: Regular  rate and rhythm with no murmurs appreciated Pulm: Normal work of breathing, clear to auscultation bilaterally with no crackles, wheezes, or rhonchi MSK: Right arm without deformities.  Pain when lifting above level of shoulder.  Supraspinatus testing intact bilaterally.  Normal internal rotation.  Some weakness noted with external rotation.  Positive Neer and Hawkins test. Neuro: Grossly normal, moves all extremities Psych: Normal affect and thought content      I,Jordan Kelly,acting as a scribe for Dimas Chyle, MD.,have documented all relevant documentation on the behalf of Dimas Chyle, MD,as directed by  Dimas Chyle, MD while in the presence of Dimas Chyle, MD.   I, Dimas Chyle, MD, have reviewed all documentation for this visit. The documentation on 01/17/21 for the exam, diagnosis, procedures, and orders are all accurate and complete.  Algis Greenhouse. Jerline Pain, MD 01/17/2021 9:26 AM

## 2021-01-25 ENCOUNTER — Encounter: Payer: Self-pay | Admitting: Family Medicine

## 2021-02-05 ENCOUNTER — Telehealth: Payer: Self-pay

## 2021-02-05 DIAGNOSIS — M25519 Pain in unspecified shoulder: Secondary | ICD-10-CM

## 2021-02-05 NOTE — Telephone Encounter (Signed)
Patient states he was told to call back so he is retuning call regarding torn rotator cuff. Please return call

## 2021-02-06 NOTE — Telephone Encounter (Signed)
We can try PT referral first and then if still not improving refer to a specialist or check MRI.  Algis Greenhouse. Jerline Pain, MD 02/06/2021 10:36 AM

## 2021-02-06 NOTE — Telephone Encounter (Signed)
I spoke with pt to adjust concerns, he states that he was given medicine to help with rotator cuff swelling. He says that it has helped some, but is very painful. He wants to know next steps for therapy or MRI?

## 2021-02-07 NOTE — Telephone Encounter (Signed)
Called and spoke with pt and PT referral placed, pt made aware of that and message below.

## 2021-02-20 ENCOUNTER — Ambulatory Visit: Payer: BC Managed Care – PPO | Admitting: Physical Therapy

## 2021-02-22 ENCOUNTER — Ambulatory Visit (INDEPENDENT_AMBULATORY_CARE_PROVIDER_SITE_OTHER): Payer: BC Managed Care – PPO | Admitting: Physical Therapy

## 2021-02-22 ENCOUNTER — Other Ambulatory Visit: Payer: Self-pay

## 2021-02-22 DIAGNOSIS — M25511 Pain in right shoulder: Secondary | ICD-10-CM | POA: Diagnosis not present

## 2021-02-22 DIAGNOSIS — G8929 Other chronic pain: Secondary | ICD-10-CM | POA: Diagnosis not present

## 2021-02-22 DIAGNOSIS — M25611 Stiffness of right shoulder, not elsewhere classified: Secondary | ICD-10-CM | POA: Diagnosis not present

## 2021-02-22 DIAGNOSIS — M6281 Muscle weakness (generalized): Secondary | ICD-10-CM

## 2021-02-22 NOTE — Patient Instructions (Signed)
Access Code: [email or web address] URL: [email or web address] Date: 02/22/2021 Prepared by: Almyra Free  Exercises Supine Shoulder Flexion Extension AAROM with Dowel - 1 x daily - 7 x weekly - 2 sets - 10 reps - 5 seconds hold Supine Shoulder External Rotation with Dowel - 1 x daily - 7 x weekly - 2 sets - 10 reps - 5 sseconds hold Standing Shoulder Internal Rotation Stretch with Towel - 1 x daily - 7 x weekly - 1 sets - 3 reps - 30-60 sec hold Standing Shoulder and Trunk Flexion at Table - 1 x daily - 7 x weekly - 1 sets - 3 reps - 30-60 sec hold

## 2021-02-22 NOTE — Therapy (Signed)
[facility] [address] [place], [place], [address] Phone: [phone]   Fax:  [phone]  Physical Therapy Evaluation  Patient Details  Name: Preston Gamble MRN: [id number] Date of Birth: Jul 29, 1960 Referring Provider (PT): Acquanetta Sit MD   Encounter Date: 02/22/2021   PT End of Session - 02/22/21 1433     Visit Number 1    Number of Visits 12    Date for PT Re-Evaluation 04/05/21    Authorization Type BCBS    PT Start Time 1433    PT Stop Time 6629    PT Time Calculation (min) 43 min    Activity Tolerance Patient tolerated treatment well    Behavior During Therapy [facility] for tasks assessed/performed             Past Medical History:  Diagnosis Date   Cancer ([place])    lung cancer- dx 06-2020   Chest pain    due to injury   Diabetes mellitus without complication ([place])    Dilated aortic root ([place]) 05/27/2015   82mm by Chest CTA 2021   Family history of early CAD 05/17/2016   Hyperlipidemia    Mitral valve prolapse    with mild MR   Umbilical hernia     Past Surgical History:  Procedure Laterality Date   BRONCHIAL BIOPSY  09/06/2020   part of lobe removed (upper) Procedure: BRONCHIAL BIOPSIES;  Surgeon: Garner Nash, DO;  Location: [facility];  Service: Pulmonary;;   BRONCHIAL BRUSHINGS  09/06/2020   Procedure: BRONCHIAL BRUSHINGS;  Surgeon: Garner Nash, DO;  Location: [place];  Service: Pulmonary;;   BRONCHIAL NEEDLE ASPIRATION BIOPSY  09/06/2020   Procedure: BRONCHIAL NEEDLE ASPIRATION BIOPSIES;  Surgeon: Garner Nash, DO;  Location: [facility];  Service: Pulmonary;;   COLONOSCOPY     HERNIA REPAIR     INTERCOSTAL NERVE BLOCK Left 09/06/2020   Procedure: INTERCOSTAL NERVE BLOCK;  Surgeon: Lajuana Matte, MD;  Location: [facility];  Service: Thoracic;  Laterality: Left;   NECK SURGERY     PLANTAR FASCIA RELEASE     SCLEROTHERAPY  09/06/2020   Procedure: SCLEROTHERAPY;  Surgeon: Garner Nash, DO;  Location: [facility];  Service: Pulmonary;;   SEPTOPLASTY  4765   UMBILICAL HERNIA REPAIR  07/09/2012   Procedure: HERNIA REPAIR UMBILICAL ADULT;  Surgeon: Haywood Lasso, MD;  Location: [facility];  Service: General;  Laterality: N/A;  repair umbilical hernia   VIDEO BRONCHOSCOPY WITH ENDOBRONCHIAL NAVIGATION Left 09/06/2020   Procedure: VIDEO BRONCHOSCOPY WITH ENDOBRONCHIAL NAVIGATION;  Surgeon: Garner Nash, DO;  Location: [facility];  Service: Pulmonary;  Laterality: Left;  biopsies and fiducial dye marking, MB + ICG    There were no vitals filed for this visit.    Subjective Assessment - 02/22/21 1436     Subjective Right shoulder has been hurting for 6-8 mos. Now having trouble reaching backward. Can't sleep on right side anymore. can't throw.    Pertinent History Lung surgery for CA 3/22 (clear now); DM, left cervcial discectomy 2017    Patient Stated Goals make it feel better, get motion back without pain    Currently in Pain? Yes    Pain Score 7     Pain Location Shoulder    Pain Orientation Right    Pain Descriptors / Indicators Sharp   tiring, weak   Pain Type Chronic pain    Pain Radiating Towards into deltoid    Pain  Onset More than a month ago    Pain Frequency Intermittent    Aggravating Factors  sleeping on it, external rotation                [facility] PT Assessment - 02/22/21 0001       Assessment   Medical Diagnosis right shoulder pain    Referring Provider (PT) Acquanetta Sit MD    Onset Date/Surgical Date 07/08/20    Hand Dominance Right    Next MD Visit Call as needed      Precautions   Precautions None      Restrictions   Weight Bearing Restrictions No      Balance Screen   Has the patient fallen in the past 6 months No    Has the patient had a decrease in activity level because of a fear of falling?  No    Is the patient reluctant to leave their home because of a fear of falling?  No      Home Social research officer, government residence      Prior Function   Level of Independence Independent    Vocation Full time employment    Vocation Requirements drives/ in sales      Posture/Postural Control   Posture/Postural Control No significant limitations      ROM / Strength   AROM / PROM / Strength AROM;PROM;Strength      AROM   Overall AROM Comments thoracic right rotation limited by shoulder pain    AROM Assessment Site Shoulder    Right/Left Shoulder Right    Right Shoulder Flexion 145 Degrees   pain   Right Shoulder ABduction 102 Degrees    Right Shoulder Internal Rotation 25 Degrees   left thumb to L5/S1   Right Shoulder External Rotation 45 Degrees      PROM   PROM Assessment Site Shoulder    Right/Left Shoulder Right    Right Shoulder Flexion 150 Degrees    Right Shoulder ABduction 105 Degrees    Right Shoulder Internal Rotation 45 Degrees    Right Shoulder External Rotation 50 Degrees      Strength   Overall Strength Comments some weakness with ER at 90/90    Strength Assessment Site Shoulder    Right/Left Shoulder Right    Right Shoulder Flexion 4+/5    Right Shoulder Extension 5/5    Right Shoulder ABduction 4+/5    Right Shoulder Internal Rotation 4+/5    Right Shoulder External Rotation 5/5   4/5 in 90/90 position     Flexibility   Soft Tissue Assessment /Muscle Length yes   tight right lats, subscapularis     Palpation   Spinal mobility thoracic mobility WNL    Palpation comment tender at right IS insertion and subscap muscle belly                        Objective measurements completed on examination: See above findings.       [facility] Adult PT Treatment/Exercise - 02/22/21 0001       Exercises   Exercises Shoulder      Shoulder Exercises: Stretch   Internal Rotation Stretch 1 rep    Internal Rotation Stretch Limitations standing with strap; also standing using other hand; cues to stand up straight    External Rotation Stretch 3  reps;10 seconds   with cane at 45 deg ABD and at side   Table Stretch - Flexion  1 rep;10 seconds    Table Stretch -Flexion Limitations standing at sink    Other Shoulder Stretches Flexion ROM with cane x 5 in supine                    PT Education - 02/22/21 1517     Education Details HEP    Person(s) Educated Patient    Methods Explanation;Demonstration;Handout    Comprehension Verbalized understanding;Returned demonstration                 PT Long Term Goals - 02/22/21 1531       PT LONG TERM GOAL #1   Title independent with HEP to maintain shoulder function    Time 6    Period Weeks    Status New    Target Date 04/05/21      PT LONG TERM GOAL #2   Title Improved right shoulder ER to allow patient to reach behind him and also throw without difficulty and 2/10 pain or less    Time 6    Period Weeks    Status New      PT LONG TERM GOAL #3   Title Patient able to reach behind his back with his right arm with thumb to L2 or higher to normalize function    Time 6    Period Weeks    Status New      PT LONG TERM GOAL #4   Title Able to reach right arm overhead in flexion to >= 160 deg to normalize ADLs      PT LONG TERM GOAL #5   Title Improved right shoulder strength to 5/5    Time 6    Period Weeks    Status New      Additional Long Term Goals   Additional Long Term Goals Yes      PT LONG TERM GOAL #6   Title Pt able to sleep on right side without waking from pain    Time 6    Period Weeks    Status New                    Plan - 02/22/21 1524     Clinical Impression Statement Patient presents with c/o of right shoulder pain x 8 months and presents with signs and sx consistent with adhesive capsulitis. He has pain primarily with reaching behind with ER, but also cannot sleep on the right side comfortably anymore. He has fairly good strength but has functional weakness in ABD and ER. His main deficit is ROM. He is significantly limited  in all planes except extension. He will benefit from PT to address theses deficits and restore function to previous level.    Examination-Activity Limitations Sleep;Other   throw   Stability/Clinical Decision Making Stable/Uncomplicated    Clinical Decision Making Low    Rehab Potential Excellent    PT Frequency 2x / week    PT Duration 6 weeks    PT Treatment/Interventions ADLs/Self Care Home Management;Cryotherapy;Electrical Stimulation;Iontophoresis 4mg /ml Dexamethasone;Moist Heat;Neuromuscular re-education;Therapeutic exercise;Therapeutic activities;Patient/family education;Manual techniques;Dry needling;Passive range of motion;Joint Manipulations;Spinal Manipulations    PT Next Visit Plan ROM, possible DN to right subscap, lats    PT Home Exercise Plan [email or web address]    Consulted and Agree with Plan of Care Patient             Patient will benefit from skilled therapeutic intervention in order to improve the following deficits and impairments:  Decreased range of  motion, Pain, Impaired UE functional use, Increased muscle spasms, Decreased strength, Impaired flexibility  Visit Diagnosis: Stiffness of right shoulder, not elsewhere classified - Plan: PT plan of care cert/re-cert  Chronic right shoulder pain - Plan: PT plan of care cert/re-cert  Muscle weakness (generalized) - Plan: PT plan of care cert/re-cert     Problem List Patient Active Problem List   Diagnosis Date Noted   Left upper lobe pulmonary nodule 09/06/2020   Stage I Lung cancer [facility]) s/p resection 09/2020 07/25/2020   Family history of early CAD 05/17/2016   MVP (mitral valve prolapse) 05/16/2016   Dilated aortic root ([place]) 05/27/2015   Diabetes mellitus without complication ([place])    Dyslipidemia associated with type 2 diabetes mellitus ([place])    Madelyn Flavors PT 02/22/2021, 3:44 PM  [facility] [address] [place], [place], [address] Phone: [phone]   Fax:   [phone]  Name: Preston Gamble MRN: [id number] Date of Birth: 1960/09/10

## 2021-02-26 ENCOUNTER — Encounter: Payer: Self-pay | Admitting: Physical Therapy

## 2021-02-26 ENCOUNTER — Other Ambulatory Visit: Payer: Self-pay

## 2021-02-26 ENCOUNTER — Ambulatory Visit (INDEPENDENT_AMBULATORY_CARE_PROVIDER_SITE_OTHER): Payer: BC Managed Care – PPO | Admitting: Physical Therapy

## 2021-02-26 DIAGNOSIS — G8929 Other chronic pain: Secondary | ICD-10-CM

## 2021-02-26 DIAGNOSIS — M6281 Muscle weakness (generalized): Secondary | ICD-10-CM

## 2021-02-26 DIAGNOSIS — M25611 Stiffness of right shoulder, not elsewhere classified: Secondary | ICD-10-CM | POA: Diagnosis not present

## 2021-02-26 DIAGNOSIS — M25511 Pain in right shoulder: Secondary | ICD-10-CM

## 2021-02-26 NOTE — Therapy (Signed)
[facility] [address] [place], [place], [address] Phone: [phone]   Fax:  [phone]  Physical Therapy Treatment  Patient Details  Name: Preston Gamble MRN: [id number] Date of Birth: 05-08-1961 Referring Provider (PT): Acquanetta Sit MD   Encounter Date: 02/26/2021   PT End of Session - 02/26/21 1144     Visit Number 2    Number of Visits 12    Date for PT Re-Evaluation 04/05/21    Authorization Type BCBS    PT Start Time 0803    PT Stop Time 0846    PT Time Calculation (min) 43 min    Activity Tolerance Patient tolerated treatment well    Behavior During Therapy [facility] for tasks assessed/performed             Past Medical History:  Diagnosis Date   Cancer ([place])    lung cancer- dx 06-2020   Chest pain    due to injury   Diabetes mellitus without complication ([place])    Dilated aortic root ([place]) 05/27/2015   22mm by Chest CTA 2021   Family history of early CAD 05/17/2016   Hyperlipidemia    Mitral valve prolapse    with mild MR   Umbilical hernia     Past Surgical History:  Procedure Laterality Date   BRONCHIAL BIOPSY  09/06/2020   part of lobe removed (upper) Procedure: BRONCHIAL BIOPSIES;  Surgeon: Garner Nash, DO;  Location: [facility];  Service: Pulmonary;;   BRONCHIAL BRUSHINGS  09/06/2020   Procedure: BRONCHIAL BRUSHINGS;  Surgeon: Garner Nash, DO;  Location: [facility];  Service: Pulmonary;;   BRONCHIAL NEEDLE ASPIRATION BIOPSY  09/06/2020   Procedure: BRONCHIAL NEEDLE ASPIRATION BIOPSIES;  Surgeon: Garner Nash, DO;  Location: [facility];  Service: Pulmonary;;   COLONOSCOPY     HERNIA REPAIR     INTERCOSTAL NERVE BLOCK Left 09/06/2020   Procedure: INTERCOSTAL NERVE BLOCK;  Surgeon: Lajuana Matte, MD;  Location: [facility];  Service: Thoracic;  Laterality: Left;   NECK SURGERY     PLANTAR FASCIA RELEASE     SCLEROTHERAPY  09/06/2020   Procedure: SCLEROTHERAPY;  Surgeon: Garner Nash,  DO;  Location: [facility];  Service: Pulmonary;;   SEPTOPLASTY  6222   UMBILICAL HERNIA REPAIR  07/09/2012   Procedure: HERNIA REPAIR UMBILICAL ADULT;  Surgeon: Haywood Lasso, MD;  Location: [facility];  Service: General;  Laterality: N/A;  repair umbilical hernia   VIDEO BRONCHOSCOPY WITH ENDOBRONCHIAL NAVIGATION Left 09/06/2020   Procedure: VIDEO BRONCHOSCOPY WITH ENDOBRONCHIAL NAVIGATION;  Surgeon: Garner Nash, DO;  Location: [facility];  Service: Pulmonary;  Laterality: Left;  biopsies and fiducial dye marking, MB + ICG    There were no vitals filed for this visit.   Subjective Assessment - 02/26/21 1142     Subjective Pt states pain with reaching out, behind, and certain motions.    Pertinent History Lung surgery for CA 3/22 (clear now); DM, left cervcial discectomy 2017    Currently in Pain? Yes    Pain Score 5     Pain Location Shoulder    Pain Orientation Right    Pain Descriptors / Indicators Sore;Sharp    Pain Type Chronic pain    Pain Onset More than a month ago    Pain Frequency Intermittent    Aggravating Factors  slepeing on it, ER, IR behind back.  [facility] Adult PT Treatment/Exercise - 02/26/21 0001       Posture/Postural Control   Posture/Postural Control No significant limitations      Exercises   Exercises Shoulder      Shoulder Exercises: Supine   Flexion 15 reps    Flexion Limitations cane      Shoulder Exercises: Sidelying   External Rotation 20 reps    External Rotation Weight (lbs) 2      Shoulder Exercises: Standing   External Rotation 20 reps    Theraband Level (Shoulder External Rotation) Level 2 (Red)    Internal Rotation 20 reps    Theraband Level (Shoulder Internal Rotation) Level 2 (Red)      Shoulder Exercises: Pulleys   Flexion 2 minutes      Shoulder Exercises: Stretch   Internal Rotation Stretch 5 reps    Internal Rotation Stretch Limitations Standing,  shoulder ext, side, and up/down with stick for IR.    External Rotation Stretch 3 reps;10 seconds   with cane at 45 deg ABD and at side /supine   Table Stretch - Flexion --    Table Stretch -Flexion Limitations --    Other Shoulder Stretches --    Other Shoulder Stretches wall slides x 10 with R      Manual Therapy   Manual Therapy Joint mobilization;Passive ROM    Joint Mobilization Post and inf GHJ mobs gr 3;    Passive ROM R shoulder, all motions                         PT Long Term Goals - 02/22/21 1531       PT LONG TERM GOAL #1   Title independent with HEP to maintain shoulder function    Time 6    Period Weeks    Status New    Target Date 04/05/21      PT LONG TERM GOAL #2   Title Improved right shoulder ER to allow patient to reach behind him and also throw without difficulty and 2/10 pain or less    Time 6    Period Weeks    Status New      PT LONG TERM GOAL #3   Title Patient able to reach behind his back with his right arm with thumb to L2 or higher to normalize function    Time 6    Period Weeks    Status New      PT LONG TERM GOAL #4   Title Able to reach right arm overhead in flexion to >= 160 deg to normalize ADLs      PT LONG TERM GOAL #5   Title Improved right shoulder strength to 5/5    Time 6    Period Weeks    Status New      Additional Long Term Goals   Additional Long Term Goals Yes      PT LONG TERM GOAL #6   Title Pt able to sleep on right side without waking from pain    Time 6    Period Weeks    Status New                   Plan - 02/26/21 1145     Clinical Impression Statement Pt with mild shoulder stiffness at end ranges for flexion, ER and IR. Pt with pain with most reaching motions. Discussed going gentle with IR stretching to decrease pain. Able to add  light rotation strengthening without pain, plan to progress strength as tolerated.    Examination-Activity Limitations Sleep;Other   throw    Stability/Clinical Decision Making Stable/Uncomplicated    Rehab Potential Excellent    PT Frequency 2x / week    PT Duration 6 weeks    PT Treatment/Interventions ADLs/Self Care Home Management;Cryotherapy;Electrical Stimulation;Iontophoresis 4mg /ml Dexamethasone;Moist Heat;Neuromuscular re-education;Therapeutic exercise;Therapeutic activities;Patient/family education;Manual techniques;Dry needling;Passive range of motion;Joint Manipulations;Spinal Manipulations    PT Next Visit Plan ROM, possible DN to right subscap, lats    PT Home Exercise Plan [email or web address]    Consulted and Agree with Plan of Care Patient             Patient will benefit from skilled therapeutic intervention in order to improve the following deficits and impairments:  Decreased range of motion, Pain, Impaired UE functional use, Increased muscle spasms, Decreased strength, Impaired flexibility  Visit Diagnosis: Stiffness of right shoulder, not elsewhere classified  Chronic right shoulder pain  Muscle weakness (generalized)     Problem List Patient Active Problem List   Diagnosis Date Noted   Left upper lobe pulmonary nodule 09/06/2020   Stage I Lung cancer [facility]) s/p resection 09/2020 07/25/2020   Family history of early CAD 05/17/2016   MVP (mitral valve prolapse) 05/16/2016   Dilated aortic root ([place]) 05/27/2015   Diabetes mellitus without complication ([place])    Dyslipidemia associated with type 2 diabetes mellitus ([place])    Lyndee Hensen, PT, DPT 11:55 AM  02/26/21    [facility] [place], [place], [address] Phone: [phone]   Fax:  [phone]  Name: Preston Gamble MRN: [id number] Date of Birth: 1960-11-15

## 2021-03-01 ENCOUNTER — Ambulatory Visit (INDEPENDENT_AMBULATORY_CARE_PROVIDER_SITE_OTHER): Payer: BC Managed Care – PPO | Admitting: Physical Therapy

## 2021-03-01 ENCOUNTER — Other Ambulatory Visit: Payer: Self-pay

## 2021-03-01 DIAGNOSIS — M25511 Pain in right shoulder: Secondary | ICD-10-CM | POA: Diagnosis not present

## 2021-03-01 DIAGNOSIS — G8929 Other chronic pain: Secondary | ICD-10-CM | POA: Diagnosis not present

## 2021-03-01 DIAGNOSIS — M6281 Muscle weakness (generalized): Secondary | ICD-10-CM | POA: Diagnosis not present

## 2021-03-01 DIAGNOSIS — M25611 Stiffness of right shoulder, not elsewhere classified: Secondary | ICD-10-CM | POA: Diagnosis not present

## 2021-03-04 ENCOUNTER — Encounter: Payer: Self-pay | Admitting: Physical Therapy

## 2021-03-04 NOTE — Therapy (Signed)
[facility] [address] [place], [place], [address] Phone: [phone]   Fax:  [phone]  Physical Therapy Treatment  Patient Details  Name: Preston Gamble MRN: [id number] Date of Birth: 1960-11-13 Referring Provider (PT): Acquanetta Sit MD   Encounter Date: 03/01/2021   PT End of Session - 03/04/21 2118     Visit Number 3    Number of Visits 12    Date for PT Re-Evaluation 04/05/21    Authorization Type BCBS    PT Start Time 1606    PT Stop Time 1644    PT Time Calculation (min) 38 min    Activity Tolerance Patient tolerated treatment well    Behavior During Therapy [facility] for tasks assessed/performed             Past Medical History:  Diagnosis Date   Cancer ([place])    lung cancer- dx 06-2020   Chest pain    due to injury   Diabetes mellitus without complication ([place])    Dilated aortic root ([place]) 05/27/2015   87mm by Chest CTA 2021   Family history of early CAD 05/17/2016   Hyperlipidemia    Mitral valve prolapse    with mild MR   Umbilical hernia     Past Surgical History:  Procedure Laterality Date   BRONCHIAL BIOPSY  09/06/2020   part of lobe removed (upper) Procedure: BRONCHIAL BIOPSIES;  Surgeon: Garner Nash, DO;  Location: [place];  Service: Pulmonary;;   BRONCHIAL BRUSHINGS  09/06/2020   Procedure: BRONCHIAL BRUSHINGS;  Surgeon: Garner Nash, DO;  Location: [place];  Service: Pulmonary;;   BRONCHIAL NEEDLE ASPIRATION BIOPSY  09/06/2020   Procedure: BRONCHIAL NEEDLE ASPIRATION BIOPSIES;  Surgeon: Garner Nash, DO;  Location: [place];  Service: Pulmonary;;   COLONOSCOPY     HERNIA REPAIR     INTERCOSTAL NERVE BLOCK Left 09/06/2020   Procedure: INTERCOSTAL NERVE BLOCK;  Surgeon: Lajuana Matte, MD;  Location: [place];  Service: Thoracic;  Laterality: Left;   NECK SURGERY     PLANTAR FASCIA RELEASE     SCLEROTHERAPY  09/06/2020   Procedure: SCLEROTHERAPY;  Surgeon: Garner Nash,  DO;  Location: [facility];  Service: Pulmonary;;   SEPTOPLASTY  6301   UMBILICAL HERNIA REPAIR  07/09/2012   Procedure: HERNIA REPAIR UMBILICAL ADULT;  Surgeon: Haywood Lasso, MD;  Location: [facility];  Service: General;  Laterality: N/A;  repair umbilical hernia   VIDEO BRONCHOSCOPY WITH ENDOBRONCHIAL NAVIGATION Left 09/06/2020   Procedure: VIDEO BRONCHOSCOPY WITH ENDOBRONCHIAL NAVIGATION;  Surgeon: Garner Nash, DO;  Location: [facility];  Service: Pulmonary;  Laterality: Left;  biopsies and fiducial dye marking, MB + ICG    There were no vitals filed for this visit.   Subjective Assessment - 03/04/21 2118     Subjective Pt states difficulty with behind the back motions.    Currently in Pain? Yes    Pain Score 5     Pain Location Shoulder    Pain Orientation Right    Pain Descriptors / Indicators Aching    Pain Type Chronic pain;Acute pain    Pain Onset More than a month ago    Pain Frequency Intermittent                               [facility] Adult PT Treatment/Exercise - 03/04/21 0001       Exercises  Exercises Shoulder      Shoulder Exercises: Supine   Flexion 15 reps    Flexion Limitations cane    Other Supine Exercises ER 3lb x 20;      Shoulder Exercises: Sidelying   External Rotation 20 reps    External Rotation Weight (lbs) 2      Shoulder Exercises: Standing   External Rotation 20 reps    Theraband Level (Shoulder External Rotation) Level 3 (Green)    Internal Rotation 20 reps    Theraband Level (Shoulder Internal Rotation) Level 3 (Green)    Row 20 reps    Theraband Level (Shoulder Row) Level 3 (Green)      Shoulder Exercises: Pulleys   Flexion 2 minutes      Shoulder Exercises: Stretch   Corner Stretch Limitations doorway: painful;    Internal Rotation Stretch 5 reps    Internal Rotation Stretch Limitations Standing, shoulder ext, side, and up/down with stick for IR.    External Rotation Stretch --   with  cane at 45 deg ABD and at side /supine     Manual Therapy   Manual Therapy Joint mobilization;Passive ROM    Joint Mobilization Post and inf GHJ mobs gr 3; IR mwm behind back    Passive ROM R shoulder, all motions                         PT Long Term Goals - 02/22/21 1531       PT LONG TERM GOAL #1   Title independent with HEP to maintain shoulder function    Time 6    Period Weeks    Status New    Target Date 04/05/21      PT LONG TERM GOAL #2   Title Improved right shoulder ER to allow patient to reach behind him and also throw without difficulty and 2/10 pain or less    Time 6    Period Weeks    Status New      PT LONG TERM GOAL #3   Title Patient able to reach behind his back with his right arm with thumb to L2 or higher to normalize function    Time 6    Period Weeks    Status New      PT LONG TERM GOAL #4   Title Able to reach right arm overhead in flexion to >= 160 deg to normalize ADLs      PT LONG TERM GOAL #5   Title Improved right shoulder strength to 5/5    Time 6    Period Weeks    Status New      Additional Long Term Goals   Additional Long Term Goals Yes      PT LONG TERM GOAL #6   Title Pt able to sleep on right side without waking from pain    Time 6    Period Weeks    Status New                   Plan - 03/04/21 2120     Clinical Impression Statement Pt with good ability for strength progressions without pain today. He has most soreness and difficulty with behind the back IR. Pt to benefit from continued mobilization and manual therapy for improving IR motion and pain, as well as progressive strengthening.    Examination-Activity Limitations Sleep;Other   throw   Stability/Clinical Decision Making Stable/Uncomplicated    Rehab Potential  Excellent    PT Frequency 2x / week    PT Duration 6 weeks    PT Treatment/Interventions ADLs/Self Care Home Management;Cryotherapy;Electrical Stimulation;Iontophoresis 4mg /ml  Dexamethasone;Moist Heat;Neuromuscular re-education;Therapeutic exercise;Therapeutic activities;Patient/family education;Manual techniques;Dry needling;Passive range of motion;Joint Manipulations;Spinal Manipulations    PT Next Visit Plan ROM, possible DN to right subscap, lats    PT Home Exercise Plan [email or web address]    Consulted and Agree with Plan of Care Patient             Patient will benefit from skilled therapeutic intervention in order to improve the following deficits and impairments:  Decreased range of motion, Pain, Impaired UE functional use, Increased muscle spasms, Decreased strength, Impaired flexibility  Visit Diagnosis: Stiffness of right shoulder, not elsewhere classified  Chronic right shoulder pain  Muscle weakness (generalized)     Problem List Patient Active Problem List   Diagnosis Date Noted   Left upper lobe pulmonary nodule 09/06/2020   Stage I Lung cancer [facility]) s/p resection 09/2020 07/25/2020   Family history of early CAD 05/17/2016   MVP (mitral valve prolapse) 05/16/2016   Dilated aortic root ([place]) 05/27/2015   Diabetes mellitus without complication ([place])    Dyslipidemia associated with type 2 diabetes mellitus ([place])    Lyndee Hensen, PT, DPT 9:29 PM  03/04/21    [facility] [place], [place], [address] Phone: [phone]   Fax:  [phone]  Name: Preston Gamble MRN: [id number] Date of Birth: Oct 29, 1960

## 2021-03-05 ENCOUNTER — Other Ambulatory Visit: Payer: Self-pay

## 2021-03-05 ENCOUNTER — Encounter: Payer: Self-pay | Admitting: Physical Therapy

## 2021-03-05 ENCOUNTER — Ambulatory Visit (INDEPENDENT_AMBULATORY_CARE_PROVIDER_SITE_OTHER): Payer: BC Managed Care – PPO | Admitting: Physical Therapy

## 2021-03-05 DIAGNOSIS — G8929 Other chronic pain: Secondary | ICD-10-CM | POA: Diagnosis not present

## 2021-03-05 DIAGNOSIS — M6281 Muscle weakness (generalized): Secondary | ICD-10-CM | POA: Diagnosis not present

## 2021-03-05 DIAGNOSIS — M25611 Stiffness of right shoulder, not elsewhere classified: Secondary | ICD-10-CM | POA: Diagnosis not present

## 2021-03-05 DIAGNOSIS — M25511 Pain in right shoulder: Secondary | ICD-10-CM | POA: Diagnosis not present

## 2021-03-05 NOTE — Therapy (Signed)
[facility] [address] [place], [place], [address] Phone: [phone]   Fax:  [phone]  Physical Therapy Treatment  Patient Details  Name: Preston Gamble MRN: [id number] Date of Birth: [birth year] Referring Provider (PT): Acquanetta Sit MD   Encounter Date: 03/05/2021   PT End of Session - 03/05/21 2123     Visit Number 4    Number of Visits 12    Date for PT Re-Evaluation 04/05/21    Authorization Type BCBS    PT Start Time 1436    PT Stop Time 6440    PT Time Calculation (min) 39 min    Activity Tolerance Patient tolerated treatment well    Behavior During Therapy [facility] for tasks assessed/performed             Past Medical History:  Diagnosis Date   Cancer ([place])    lung cancer- dx 06-2020   Chest pain    due to injury   Diabetes mellitus without complication ([place])    Dilated aortic root ([place]) 05/27/2015   20mm by Chest CTA 2021   Family history of early CAD 05/17/2016   Hyperlipidemia    Mitral valve prolapse    with mild MR   Umbilical hernia     Past Surgical History:  Procedure Laterality Date   BRONCHIAL BIOPSY  09/06/2020   part of lobe removed (upper) Procedure: BRONCHIAL BIOPSIES;  Surgeon: Garner Nash, DO;  Location: [place];  Service: Pulmonary;;   BRONCHIAL BRUSHINGS  09/06/2020   Procedure: BRONCHIAL BRUSHINGS;  Surgeon: Garner Nash, DO;  Location: [facility];  Service: Pulmonary;;   BRONCHIAL NEEDLE ASPIRATION BIOPSY  09/06/2020   Procedure: BRONCHIAL NEEDLE ASPIRATION BIOPSIES;  Surgeon: Garner Nash, DO;  Location: [place];  Service: Pulmonary;;   COLONOSCOPY     HERNIA REPAIR     INTERCOSTAL NERVE BLOCK Left 09/06/2020   Procedure: INTERCOSTAL NERVE BLOCK;  Surgeon: Lajuana Matte, MD;  Location: [place];  Service: Thoracic;  Laterality: Left;   NECK SURGERY     PLANTAR FASCIA RELEASE     SCLEROTHERAPY  09/06/2020   Procedure: SCLEROTHERAPY;  Surgeon: Garner Nash,  DO;  Location: [facility];  Service: Pulmonary;;   SEPTOPLASTY  3474   UMBILICAL HERNIA REPAIR  07/09/2012   Procedure: HERNIA REPAIR UMBILICAL ADULT;  Surgeon: Haywood Lasso, MD;  Location: [facility] [place];  Service: General;  Laterality: N/A;  repair umbilical hernia   VIDEO BRONCHOSCOPY WITH ENDOBRONCHIAL NAVIGATION Left 09/06/2020   Procedure: VIDEO BRONCHOSCOPY WITH ENDOBRONCHIAL NAVIGATION;  Surgeon: Garner Nash, DO;  Location: [facility];  Service: Pulmonary;  Laterality: Left;  biopsies and fiducial dye marking, MB + ICG    There were no vitals filed for this visit.   Subjective Assessment - 03/05/21 2121     Subjective Pt with no new complaints.    Currently in Pain? Yes    Pain Score 5     Pain Location Shoulder    Pain Orientation Right    Pain Descriptors / Indicators Aching    Pain Type Acute pain    Pain Onset More than a month ago    Pain Frequency Intermittent                               [facility] Adult PT Treatment/Exercise - 03/05/21 0001       Exercises   Exercises Shoulder  Shoulder Exercises: Supine   Flexion 15 reps;AROM    Flexion Limitations --    Other Supine Exercises ER 3lb x 20;      Shoulder Exercises: Sidelying   External Rotation --    External Rotation Weight (lbs) --      Shoulder Exercises: Standing   External Rotation 20 reps    Theraband Level (Shoulder External Rotation) Level 3 (Green)    Internal Rotation --    Theraband Level (Shoulder Internal Rotation) --    Flexion AROM;15 reps    Flexion Limitations scaption    ABduction AROM;15 reps    Row 20 reps    Theraband Level (Shoulder Row) Level 4 (Blue)    Other Standing Exercises Wall slides R UE x 15;      Shoulder Exercises: Pulleys   Flexion --      Shoulder Exercises: Stretch   Corner Stretch Limitations --    Internal Rotation Stretch 5 reps    Internal Rotation Stretch Limitations Standing, with strap, and with 2 UE hand hold     External Rotation Stretch --   with cane at 45 deg ABD and at side /supine     Manual Therapy   Manual Therapy Joint mobilization;Passive ROM    Joint Mobilization Post and inf GHJ mobs gr 3;    Passive ROM R shoulder, all motions; contract relax for IR ;                         PT Long Term Goals - 02/22/21 1531       PT LONG TERM GOAL #1   Title independent with HEP to maintain shoulder function    Time 6    Period Weeks    Status New    Target Date 04/05/21      PT LONG TERM GOAL #2   Title Improved right shoulder ER to allow patient to reach behind him and also throw without difficulty and 2/10 pain or less    Time 6    Period Weeks    Status New      PT LONG TERM GOAL #3   Title Patient able to reach behind his back with his right arm with thumb to L2 or higher to normalize function    Time 6    Period Weeks    Status New      PT LONG TERM GOAL #4   Title Able to reach right arm overhead in flexion to >= 160 deg to normalize ADLs      PT LONG TERM GOAL #5   Title Improved right shoulder strength to 5/5    Time 6    Period Weeks    Status New      Additional Long Term Goals   Additional Long Term Goals Yes      PT LONG TERM GOAL #6   Title Pt able to sleep on right side without waking from pain    Time 6    Period Weeks    Status New                   Plan - 03/05/21 2127     Clinical Impression Statement Pt able to progress ther ex for strengthening and elevation motions, with little pain. Pt with most difficulty and pain with behind the back IR. Slight improvment in ROM after session today, but continues to have discomfort. He does have improved PROM and joint  mobility for all other motions. Plan to continue manual and mobiliztaion for IR ROM, and progress strength as tolerated.    Examination-Activity Limitations Sleep;Other   throw   Stability/Clinical Decision Making Stable/Uncomplicated    Rehab Potential Excellent    PT  Frequency 2x / week    PT Duration 6 weeks    PT Treatment/Interventions ADLs/Self Care Home Management;Cryotherapy;Electrical Stimulation;Iontophoresis 4mg /ml Dexamethasone;Moist Heat;Neuromuscular re-education;Therapeutic exercise;Therapeutic activities;Patient/family education;Manual techniques;Dry needling;Passive range of motion;Joint Manipulations;Spinal Manipulations    PT Next Visit Plan ROM, possible DN to right subscap, lats    PT Home Exercise Plan [email or web address]    Consulted and Agree with Plan of Care Patient             Patient will benefit from skilled therapeutic intervention in order to improve the following deficits and impairments:  Decreased range of motion, Pain, Impaired UE functional use, Increased muscle spasms, Decreased strength, Impaired flexibility  Visit Diagnosis: Stiffness of right shoulder, not elsewhere classified  Chronic right shoulder pain  Muscle weakness (generalized)     Problem List Patient Active Problem List   Diagnosis Date Noted   Left upper lobe pulmonary nodule 09/06/2020   Stage I Lung cancer [facility]) s/p resection 09/2020 07/25/2020   Family history of early CAD 05/17/2016   MVP (mitral valve prolapse) 05/16/2016   Dilated aortic root ([place]) 05/27/2015   Diabetes mellitus without complication ([place])    Dyslipidemia associated with type 2 diabetes mellitus ([place])      Lyndee Hensen, PT, DPT 9:29 PM  03/05/21   [facility] [place], [place], [address] Phone: [phone]   Fax:  [phone]  Name: AASHISH HAMM MRN: [id number] Date of Birth: Feb 09, 1961

## 2021-03-08 ENCOUNTER — Other Ambulatory Visit: Payer: Self-pay

## 2021-03-08 ENCOUNTER — Encounter: Payer: Self-pay | Admitting: Physical Therapy

## 2021-03-08 ENCOUNTER — Ambulatory Visit (INDEPENDENT_AMBULATORY_CARE_PROVIDER_SITE_OTHER): Payer: BC Managed Care – PPO | Admitting: Physical Therapy

## 2021-03-08 DIAGNOSIS — M25511 Pain in right shoulder: Secondary | ICD-10-CM

## 2021-03-08 DIAGNOSIS — M6281 Muscle weakness (generalized): Secondary | ICD-10-CM | POA: Diagnosis not present

## 2021-03-08 DIAGNOSIS — M25611 Stiffness of right shoulder, not elsewhere classified: Secondary | ICD-10-CM

## 2021-03-08 DIAGNOSIS — G8929 Other chronic pain: Secondary | ICD-10-CM | POA: Diagnosis not present

## 2021-03-08 NOTE — Therapy (Signed)
[facility] [address] [place], [place], [address] Phone: [phone]   Fax:  [phone]  Physical Therapy Treatment  Patient Details  Name: Preston Gamble MRN: [id number] Date of Birth: Oct 18, 1960 Referring Provider (PT): Acquanetta Sit MD   Encounter Date: 03/08/2021   PT End of Session - 03/08/21 1200     Visit Number 5    Number of Visits 12    Date for PT Re-Evaluation 04/05/21    Authorization Type BCBS    PT Start Time 0940    PT Stop Time 5027    PT Time Calculation (min) 35 min    Activity Tolerance Patient tolerated treatment well    Behavior During Therapy [facility] for tasks assessed/performed             Past Medical History:  Diagnosis Date   Cancer ([place])    lung cancer- dx 06-2020   Chest pain    due to injury   Diabetes mellitus without complication ([place])    Dilated aortic root ([place]) 05/27/2015   67mm by Chest CTA 2021   Family history of early CAD 05/17/2016   Hyperlipidemia    Mitral valve prolapse    with mild MR   Umbilical hernia     Past Surgical History:  Procedure Laterality Date   BRONCHIAL BIOPSY  09/06/2020   part of lobe removed (upper) Procedure: BRONCHIAL BIOPSIES;  Surgeon: Garner Nash, DO;  Location: [place];  Service: Pulmonary;;   BRONCHIAL BRUSHINGS  09/06/2020   Procedure: BRONCHIAL BRUSHINGS;  Surgeon: Garner Nash, DO;  Location: [place];  Service: Pulmonary;;   BRONCHIAL NEEDLE ASPIRATION BIOPSY  09/06/2020   Procedure: BRONCHIAL NEEDLE ASPIRATION BIOPSIES;  Surgeon: Garner Nash, DO;  Location: [place];  Service: Pulmonary;;   COLONOSCOPY     HERNIA REPAIR     INTERCOSTAL NERVE BLOCK Left 09/06/2020   Procedure: INTERCOSTAL NERVE BLOCK;  Surgeon: Lajuana Matte, MD;  Location: [place];  Service: Thoracic;  Laterality: Left;   NECK SURGERY     PLANTAR FASCIA RELEASE     SCLEROTHERAPY  09/06/2020   Procedure: SCLEROTHERAPY;  Surgeon: Garner Nash,  DO;  Location: [place] [facility];  Service: Pulmonary;;   SEPTOPLASTY  7412   UMBILICAL HERNIA REPAIR  07/09/2012   Procedure: HERNIA REPAIR UMBILICAL ADULT;  Surgeon: Haywood Lasso, MD;  Location: [place];  Service: General;  Laterality: N/A;  repair umbilical hernia   VIDEO BRONCHOSCOPY WITH ENDOBRONCHIAL NAVIGATION Left 09/06/2020   Procedure: VIDEO BRONCHOSCOPY WITH ENDOBRONCHIAL NAVIGATION;  Surgeon: Garner Nash, DO;  Location: [place];  Service: Pulmonary;  Laterality: Left;  biopsies and fiducial dye marking, MB + ICG    There were no vitals filed for this visit.   Subjective Assessment - 03/08/21 1159     Subjective Pt states he has been stretching arm behind his back. Is going a little further, but still hurts.    Currently in Pain? Yes    Pain Score 4     Pain Location Shoulder    Pain Orientation Right    Pain Descriptors / Indicators Aching    Pain Type Acute pain    Pain Radiating Towards into deltoid    Pain Onset More than a month ago    Pain Frequency Intermittent    Aggravating Factors  IR behind back, thowing,  [facility] Adult PT Treatment/Exercise - 03/08/21 0001       Exercises   Exercises Shoulder      Shoulder Exercises: Supine   Flexion 15 reps;AAROM    Flexion Limitations cane      Shoulder Exercises: Prone   Other Prone Exercises Prone T x 15;      Shoulder Exercises: Standing   External Rotation 20 reps    Theraband Level (Shoulder External Rotation) Level 3 (Green)    Flexion AROM;15 reps    Shoulder Flexion Weight (lbs) 2    Flexion Limitations scaption    ABduction AROM;15 reps    Shoulder ABduction Weight (lbs) 2    Other Standing Exercises Wall push ups x 20;      Shoulder Exercises: ROM/Strengthening   UBE (Upper Arm Bike) x3 min fwd/bwd      Shoulder Exercises: Stretch   Internal Rotation Stretch Limitations x15 with strap and 2 hand hold    Other Shoulder  Stretches s/l sleeper stretch for IR 10 sec x 8;      Manual Therapy   Manual Therapy Joint mobilization;Passive ROM    Joint Mobilization Post and inf GHJ mobs gr 3;    Passive ROM R shoulder, all motions; contract relax for IR and ER;                    PT Education - 03/08/21 1200     Education Details Updated HEP    Person(s) Educated Patient    Methods Explanation;Demonstration;Tactile cues;Verbal cues;Handout    Comprehension Verbalized understanding;Returned demonstration;Verbal cues required;Tactile cues required;Need further instruction                 PT Long Term Goals - 02/22/21 1531       PT LONG TERM GOAL #1   Title independent with HEP to maintain shoulder function    Time 6    Period Weeks    Status New    Target Date 04/05/21      PT LONG TERM GOAL #2   Title Improved right shoulder ER to allow patient to reach behind him and also throw without difficulty and 2/10 pain or less    Time 6    Period Weeks    Status New      PT LONG TERM GOAL #3   Title Patient able to reach behind his back with his right arm with thumb to L2 or higher to normalize function    Time 6    Period Weeks    Status New      PT LONG TERM GOAL #4   Title Able to reach right arm overhead in flexion to >= 160 deg to normalize ADLs      PT LONG TERM GOAL #5   Title Improved right shoulder strength to 5/5    Time 6    Period Weeks    Status New      Additional Long Term Goals   Additional Long Term Goals Yes      PT LONG TERM GOAL #6   Title Pt able to sleep on right side without waking from pain    Time 6    Period Weeks    Status New                   Plan - 03/08/21 1202     Clinical Impression Statement Pt with mild improvments in IR behind the back. Still is the motion that causes  the most pain, and is his main limitation. Pt with some discomfort with end ranges for IR/ER as well. Has done well with strengthening for elevation and rotation,  will continue to progress strength, and continue to work on mobilization for IR behind back. Added sleeper stretch today for HEP.    Examination-Activity Limitations Sleep;Other   throw   Stability/Clinical Decision Making Stable/Uncomplicated    Rehab Potential Excellent    PT Frequency 2x / week    PT Duration 6 weeks    PT Treatment/Interventions ADLs/Self Care Home Management;Cryotherapy;Electrical Stimulation;Iontophoresis 4mg /ml Dexamethasone;Moist Heat;Neuromuscular re-education;Therapeutic exercise;Therapeutic activities;Patient/family education;Manual techniques;Dry needling;Passive range of motion;Joint Manipulations;Spinal Manipulations    PT Next Visit Plan ROM, possible DN to right subscap, lats    PT Home Exercise Plan [email or web address]    Consulted and Agree with Plan of Care Patient             Patient will benefit from skilled therapeutic intervention in order to improve the following deficits and impairments:  Decreased range of motion, Pain, Impaired UE functional use, Increased muscle spasms, Decreased strength, Impaired flexibility  Visit Diagnosis: Stiffness of right shoulder, not elsewhere classified  Chronic right shoulder pain  Muscle weakness (generalized)     Problem List Patient Active Problem List   Diagnosis Date Noted   Left upper lobe pulmonary nodule 09/06/2020   Stage I Lung cancer [facility]) s/p resection 09/2020 07/25/2020   Family history of early CAD 05/17/2016   MVP (mitral valve prolapse) 05/16/2016   Dilated aortic root ([place]) 05/27/2015   Diabetes mellitus without complication ([place])    Dyslipidemia associated with type 2 diabetes mellitus ([place])     Lyndee Hensen, PT, DPT 12:03 PM  03/08/21    [facility] [address] [place], [place], [address] Phone: [phone]   Fax:  [phone]  Name: Preston Gamble MRN: [id number] Date of Birth: 1961/01/24

## 2021-03-13 ENCOUNTER — Encounter: Payer: Self-pay | Admitting: Physical Therapy

## 2021-03-13 ENCOUNTER — Ambulatory Visit (INDEPENDENT_AMBULATORY_CARE_PROVIDER_SITE_OTHER): Payer: BC Managed Care – PPO | Admitting: Physical Therapy

## 2021-03-13 ENCOUNTER — Other Ambulatory Visit: Payer: Self-pay

## 2021-03-13 DIAGNOSIS — M25511 Pain in right shoulder: Secondary | ICD-10-CM

## 2021-03-13 DIAGNOSIS — G8929 Other chronic pain: Secondary | ICD-10-CM | POA: Diagnosis not present

## 2021-03-13 DIAGNOSIS — M25611 Stiffness of right shoulder, not elsewhere classified: Secondary | ICD-10-CM

## 2021-03-13 DIAGNOSIS — M6281 Muscle weakness (generalized): Secondary | ICD-10-CM

## 2021-03-14 ENCOUNTER — Encounter: Payer: Self-pay | Admitting: Physical Therapy

## 2021-03-14 NOTE — Therapy (Signed)
[facility] [address] [place], [place], [address] Phone: [phone]   Fax:  [phone]  Physical Therapy Treatment  Patient Details  Name: Preston Gamble MRN: [id number] Date of Birth: 1960/07/18 Referring Provider (PT): Acquanetta Sit MD   Encounter Date: 03/13/2021   PT End of Session - 03/14/21 2228     Visit Number 6    Number of Visits 12    Date for PT Re-Evaluation 04/05/21    Authorization Type BCBS    PT Start Time 2500    PT Stop Time 0840    PT Time Calculation (min) 41 min    Activity Tolerance Patient tolerated treatment well    Behavior During Therapy [facility] for tasks assessed/performed             Past Medical History:  Diagnosis Date   Cancer ([place])    lung cancer- dx 06-2020   Chest pain    due to injury   Diabetes mellitus without complication ([place])    Dilated aortic root ([place]) 05/27/2015   49mm by Chest CTA 2021   Family history of early CAD 05/17/2016   Hyperlipidemia    Mitral valve prolapse    with mild MR   Umbilical hernia     Past Surgical History:  Procedure Laterality Date   BRONCHIAL BIOPSY  09/06/2020   part of lobe removed (upper) Procedure: BRONCHIAL BIOPSIES;  Surgeon: Garner Nash, DO;  Location: [place];  Service: Pulmonary;;   BRONCHIAL BRUSHINGS  09/06/2020   Procedure: BRONCHIAL BRUSHINGS;  Surgeon: Garner Nash, DO;  Location: [facility];  Service: Pulmonary;;   BRONCHIAL NEEDLE ASPIRATION BIOPSY  09/06/2020   Procedure: BRONCHIAL NEEDLE ASPIRATION BIOPSIES;  Surgeon: Garner Nash, DO;  Location: [place];  Service: Pulmonary;;   COLONOSCOPY     HERNIA REPAIR     INTERCOSTAL NERVE BLOCK Left 09/06/2020   Procedure: INTERCOSTAL NERVE BLOCK;  Surgeon: Lajuana Matte, MD;  Location: [place];  Service: Thoracic;  Laterality: Left;   NECK SURGERY     PLANTAR FASCIA RELEASE     SCLEROTHERAPY  09/06/2020   Procedure: SCLEROTHERAPY;  Surgeon: Garner Nash,  DO;  Location: [facility];  Service: Pulmonary;;   SEPTOPLASTY  3704   UMBILICAL HERNIA REPAIR  07/09/2012   Procedure: HERNIA REPAIR UMBILICAL ADULT;  Surgeon: Haywood Lasso, MD;  Location: [place];  Service: General;  Laterality: N/A;  repair umbilical hernia   VIDEO BRONCHOSCOPY WITH ENDOBRONCHIAL NAVIGATION Left 09/06/2020   Procedure: VIDEO BRONCHOSCOPY WITH ENDOBRONCHIAL NAVIGATION;  Surgeon: Garner Nash, DO;  Location: [place];  Service: Pulmonary;  Laterality: Left;  biopsies and fiducial dye marking, MB + ICG    There were no vitals filed for this visit.   Subjective Assessment - 03/14/21 2228     Subjective Pt states less pain, but still pain/difficulty with certian motions.    Currently in Pain? Yes    Pain Score 4     Pain Location Shoulder    Pain Orientation Right    Pain Descriptors / Indicators Aching    Pain Type Acute pain    Pain Onset More than a month ago    Pain Frequency Intermittent                               [facility] Adult PT Treatment/Exercise - 03/14/21 0001  Exercises   Exercises Shoulder      Shoulder Exercises: Supine   Flexion 15 reps;AAROM    Flexion Limitations cane    Other Supine Exercises ER at 90/90 with cane x 10;      Shoulder Exercises: Prone   Other Prone Exercises Prone T x 15;      Shoulder Exercises: Standing   External Rotation 20 reps    Theraband Level (Shoulder External Rotation) Level 3 (Green)    External Rotation Limitations bil    Flexion AROM;15 reps    Shoulder Flexion Weight (lbs) 2    Flexion Limitations scaption    ABduction AROM;15 reps    Shoulder ABduction Weight (lbs) 2    Other Standing Exercises Wall push ups x 20;    Other Standing Exercises light throwing R arm x 10.      Shoulder Exercises: ROM/Strengthening   UBE (Upper Arm Bike) x3 min fwd/bwd      Shoulder Exercises: Stretch   Internal Rotation Stretch Limitations x15 with strap and 2 hand  hold   with mulligan mobs for IR;     Manual Therapy   Manual Therapy Joint mobilization;Passive ROM    Joint Mobilization Post and inf GHJ mobs gr 3;    Passive ROM R shoulder, all motions; contract relax for IR and ER;                     PT Education - 03/14/21 2228     Education Details reviewed HEP    Person(s) Educated Patient    Methods Explanation;Demonstration;Tactile cues;Verbal cues;Handout    Comprehension Verbalized understanding;Returned demonstration;Verbal cues required;Tactile cues required;Need further instruction                 PT Long Term Goals - 02/22/21 1531       PT LONG TERM GOAL #1   Title independent with HEP to maintain shoulder function    Time 6    Period Weeks    Status New    Target Date 04/05/21      PT LONG TERM GOAL #2   Title Improved right shoulder ER to allow patient to reach behind him and also throw without difficulty and 2/10 pain or less    Time 6    Period Weeks    Status New      PT LONG TERM GOAL #3   Title Patient able to reach behind his back with his right arm with thumb to L2 or higher to normalize function    Time 6    Period Weeks    Status New      PT LONG TERM GOAL #4   Title Able to reach right arm overhead in flexion to >= 160 deg to normalize ADLs      PT LONG TERM GOAL #5   Title Improved right shoulder strength to 5/5    Time 6    Period Weeks    Status New      Additional Long Term Goals   Additional Long Term Goals Yes      PT LONG TERM GOAL #6   Title Pt able to sleep on right side without waking from pain    Time 6    Period Weeks    Status New                   Plan - 03/14/21 2237     Clinical Impression Statement Pt with noted improvments  with ROM with measurements today. He has improved ability for overhead flex and abd without pain. He does still have mild limitations for joint motion for end range flexion, ER, and moslty for behind the back IR, with is  imrpoving some, but still painful and limited. Pt to benefit from continued care to continue to improve shoulder ROM and pain.    Examination-Activity Limitations Sleep;Other   throw   Stability/Clinical Decision Making Stable/Uncomplicated    Rehab Potential Excellent    PT Frequency 2x / week    PT Duration 6 weeks    PT Treatment/Interventions ADLs/Self Care Home Management;Cryotherapy;Electrical Stimulation;Iontophoresis 4mg /ml Dexamethasone;Moist Heat;Neuromuscular re-education;Therapeutic exercise;Therapeutic activities;Patient/family education;Manual techniques;Dry needling;Passive range of motion;Joint Manipulations;Spinal Manipulations    PT Next Visit Plan ROM, possible DN to right subscap, lats    PT Home Exercise Plan [email or web address]    Consulted and Agree with Plan of Care Patient             Patient will benefit from skilled therapeutic intervention in order to improve the following deficits and impairments:  Decreased range of motion, Pain, Impaired UE functional use, Increased muscle spasms, Decreased strength, Impaired flexibility  Visit Diagnosis: Muscle weakness (generalized)  Stiffness of right shoulder, not elsewhere classified  Chronic right shoulder pain     Problem List Patient Active Problem List   Diagnosis Date Noted   Left upper lobe pulmonary nodule 09/06/2020   Stage I Lung cancer [facility]) s/p resection 09/2020 07/25/2020   Family history of early CAD 05/17/2016   MVP (mitral valve prolapse) 05/16/2016   Dilated aortic root ([place]) 05/27/2015   Diabetes mellitus without complication ([place])    Dyslipidemia associated with type 2 diabetes mellitus ([place])     Lyndee Hensen, PT, DPT 10:39 PM  03/14/21   [facility] [address] [place], [place], [address] Phone: [phone]   Fax:  [phone]  Name: Preston Gamble MRN: [id number] Date of Birth: [birth year]

## 2021-03-15 ENCOUNTER — Encounter: Payer: Self-pay | Admitting: Physical Therapy

## 2021-03-15 ENCOUNTER — Ambulatory Visit (INDEPENDENT_AMBULATORY_CARE_PROVIDER_SITE_OTHER): Payer: BC Managed Care – PPO | Admitting: Physical Therapy

## 2021-03-15 ENCOUNTER — Other Ambulatory Visit: Payer: Self-pay

## 2021-03-15 DIAGNOSIS — M25511 Pain in right shoulder: Secondary | ICD-10-CM | POA: Diagnosis not present

## 2021-03-15 DIAGNOSIS — G8929 Other chronic pain: Secondary | ICD-10-CM | POA: Diagnosis not present

## 2021-03-15 DIAGNOSIS — M25611 Stiffness of right shoulder, not elsewhere classified: Secondary | ICD-10-CM | POA: Diagnosis not present

## 2021-03-15 DIAGNOSIS — M6281 Muscle weakness (generalized): Secondary | ICD-10-CM | POA: Diagnosis not present

## 2021-03-15 NOTE — Therapy (Signed)
[facility] [address] [place], [place], [address] Phone: [phone]   Fax:  [phone]  Physical Therapy Treatment  Patient Details  Name: Preston Gamble MRN: [id number] Date of Birth: December 14, 1960 Referring Provider (PT): Acquanetta Sit MD   Encounter Date: 03/15/2021   PT End of Session - 03/15/21 1026     Visit Number 7    Number of Visits 12    Date for PT Re-Evaluation 04/05/21    Authorization Type BCBS    PT Start Time 0804    PT Stop Time 0845    PT Time Calculation (min) 41 min    Activity Tolerance Patient tolerated treatment well    Behavior During Therapy [facility] for tasks assessed/performed             Past Medical History:  Diagnosis Date   Cancer ([place])    lung cancer- dx 06-2020   Chest pain    due to injury   Diabetes mellitus without complication ([place])    Dilated aortic root ([place]) 05/27/2015   5mm by Chest CTA 2021   Family history of early CAD 05/17/2016   Hyperlipidemia    Mitral valve prolapse    with mild MR   Umbilical hernia     Past Surgical History:  Procedure Laterality Date   BRONCHIAL BIOPSY  09/06/2020   part of lobe removed (upper) Procedure: BRONCHIAL BIOPSIES;  Surgeon: Garner Nash, DO;  Location: [facility];  Service: Pulmonary;;   BRONCHIAL BRUSHINGS  09/06/2020   Procedure: BRONCHIAL BRUSHINGS;  Surgeon: Garner Nash, DO;  Location: [place];  Service: Pulmonary;;   BRONCHIAL NEEDLE ASPIRATION BIOPSY  09/06/2020   Procedure: BRONCHIAL NEEDLE ASPIRATION BIOPSIES;  Surgeon: Garner Nash, DO;  Location: [facility];  Service: Pulmonary;;   COLONOSCOPY     HERNIA REPAIR     INTERCOSTAL NERVE BLOCK Left 09/06/2020   Procedure: INTERCOSTAL NERVE BLOCK;  Surgeon: Lajuana Matte, MD;  Location: [place];  Service: Thoracic;  Laterality: Left;   NECK SURGERY     PLANTAR FASCIA RELEASE     SCLEROTHERAPY  09/06/2020   Procedure: SCLEROTHERAPY;  Surgeon: Garner Nash,  DO;  Location: [place] [facility];  Service: Pulmonary;;   SEPTOPLASTY  3419   UMBILICAL HERNIA REPAIR  07/09/2012   Procedure: HERNIA REPAIR UMBILICAL ADULT;  Surgeon: Haywood Lasso, MD;  Location: [facility] [place];  Service: General;  Laterality: N/A;  repair umbilical hernia   VIDEO BRONCHOSCOPY WITH ENDOBRONCHIAL NAVIGATION Left 09/06/2020   Procedure: VIDEO BRONCHOSCOPY WITH ENDOBRONCHIAL NAVIGATION;  Surgeon: Garner Nash, DO;  Location: [facility];  Service: Pulmonary;  Laterality: Left;  biopsies and fiducial dye marking, MB + ICG    There were no vitals filed for this visit.   Subjective Assessment - 03/15/21 1026     Subjective Pt with no new complaints.    Currently in Pain? Yes    Pain Score 4     Pain Location Shoulder    Pain Orientation Right    Pain Descriptors / Indicators Aching    Pain Type Acute pain    Pain Onset More than a month ago    Pain Frequency Intermittent                               [facility] Adult PT Treatment/Exercise - 03/15/21 0001       Exercises   Exercises Shoulder  Shoulder Exercises: Supine   Flexion 15 reps;AAROM    Flexion Limitations cane    Other Supine Exercises ER at 90/90 with cane x 10;      Shoulder Exercises: Prone   Other Prone Exercises Prone T x 15;      Shoulder Exercises: Standing   External Rotation 20 reps    Theraband Level (Shoulder External Rotation) Level 3 (Green)    External Rotation Limitations bil    Flexion AROM;15 reps    Shoulder Flexion Weight (lbs) 2    Flexion Limitations scaption    ABduction AROM;15 reps    Shoulder ABduction Weight (lbs) 2    Other Standing Exercises Wall push ups x 20;      Shoulder Exercises: ROM/Strengthening   UBE (Upper Arm Bike) x4 min fwd/bwd      Shoulder Exercises: Stretch   Internal Rotation Stretch Limitations x15 with strap and 2 hand hold   with mulligan mobs for IR;     Manual Therapy   Manual Therapy Joint  mobilization;Passive ROM    Joint Mobilization Post and inf GHJ mobs gr 3;    Passive ROM R shoulder, all motions; contract relax for IR and ER;                          PT Long Term Goals - 02/22/21 1531       PT LONG TERM GOAL #1   Title independent with HEP to maintain shoulder function    Time 6    Period Weeks    Status New    Target Date 04/05/21      PT LONG TERM GOAL #2   Title Improved right shoulder ER to allow patient to reach behind him and also throw without difficulty and 2/10 pain or less    Time 6    Period Weeks    Status New      PT LONG TERM GOAL #3   Title Patient able to reach behind his back with his right arm with thumb to L2 or higher to normalize function    Time 6    Period Weeks    Status New      PT LONG TERM GOAL #4   Title Able to reach right arm overhead in flexion to >= 160 deg to normalize ADLs      PT LONG TERM GOAL #5   Title Improved right shoulder strength to 5/5    Time 6    Period Weeks    Status New      Additional Long Term Goals   Additional Long Term Goals Yes      PT LONG TERM GOAL #6   Title Pt able to sleep on right side without waking from pain    Time 6    Period Weeks    Status New                   Plan - 03/15/21 1028     Clinical Impression Statement Continued focus on regaining full ROM, with manual and ther ex. Pt with limtiations for end ranged of flex, ER and IR. IR behind back imroving, but very slowly. Still painful, up to 3-4/10 with stretching and ROM in this direction. Pt states that he is trying to avoid injection or other intervention for shoulder pain. Pt to benefit from continued care for pain relief , ROM, and strengthening    Examination-Activity Limitations Sleep;Other  throw   Stability/Clinical Decision Making Stable/Uncomplicated    Rehab Potential Excellent    PT Frequency 2x / week    PT Duration 6 weeks    PT Treatment/Interventions ADLs/Self Care Home  Management;Cryotherapy;Electrical Stimulation;Iontophoresis 4mg /ml Dexamethasone;Moist Heat;Neuromuscular re-education;Therapeutic exercise;Therapeutic activities;Patient/family education;Manual techniques;Dry needling;Passive range of motion;Joint Manipulations;Spinal Manipulations    PT Next Visit Plan ROM, possible DN to right subscap, lats    PT Home Exercise Plan [email or web address]    Consulted and Agree with Plan of Care Patient             Patient will benefit from skilled therapeutic intervention in order to improve the following deficits and impairments:  Decreased range of motion, Pain, Impaired UE functional use, Increased muscle spasms, Decreased strength, Impaired flexibility  Visit Diagnosis: Muscle weakness (generalized)  Stiffness of right shoulder, not elsewhere classified  Chronic right shoulder pain     Problem List Patient Active Problem List   Diagnosis Date Noted   Left upper lobe pulmonary nodule 09/06/2020   Stage I Lung cancer [facility]) s/p resection 09/2020 07/25/2020   Family history of early CAD 05/17/2016   MVP (mitral valve prolapse) 05/16/2016   Dilated aortic root ([place]) 05/27/2015   Diabetes mellitus without complication ([place])    Dyslipidemia associated with type 2 diabetes mellitus ([place])     Lyndee Hensen, PT, DPT 10:30 AM  03/15/21    [facility] [place], [place], [address] Phone: [phone]   Fax:  [phone]  Name: KELLIE CHISOLM MRN: [id number] Date of Birth: 07/01/1961

## 2021-03-22 ENCOUNTER — Other Ambulatory Visit: Payer: Self-pay

## 2021-03-22 ENCOUNTER — Ambulatory Visit (INDEPENDENT_AMBULATORY_CARE_PROVIDER_SITE_OTHER): Payer: BC Managed Care – PPO | Admitting: Physical Therapy

## 2021-03-22 ENCOUNTER — Encounter: Payer: Self-pay | Admitting: Physical Therapy

## 2021-03-22 DIAGNOSIS — G8929 Other chronic pain: Secondary | ICD-10-CM | POA: Diagnosis not present

## 2021-03-22 DIAGNOSIS — M6281 Muscle weakness (generalized): Secondary | ICD-10-CM

## 2021-03-22 DIAGNOSIS — M25611 Stiffness of right shoulder, not elsewhere classified: Secondary | ICD-10-CM

## 2021-03-22 DIAGNOSIS — M25511 Pain in right shoulder: Secondary | ICD-10-CM

## 2021-03-22 NOTE — Therapy (Signed)
[facility] [address] [place], [place], [address] Phone: [phone]   Fax:  [phone]  Physical Therapy Treatment  Patient Details  Name: Preston Gamble MRN: [id number] Date of Birth: 1960-09-21 Referring Provider (PT): Acquanetta Sit MD   Encounter Date: 03/22/2021   PT End of Session - 03/22/21 1550     Visit Number 8    Number of Visits 12    Date for PT Re-Evaluation 04/05/21    Authorization Type BCBS    PT Start Time 1431    PT Stop Time 1512    PT Time Calculation (min) 41 min    Activity Tolerance Patient tolerated treatment well    Behavior During Therapy [facility] for tasks assessed/performed             Past Medical History:  Diagnosis Date   Cancer ([place])    lung cancer- dx 06-2020   Chest pain    due to injury   Diabetes mellitus without complication ([place])    Dilated aortic root ([place]) 05/27/2015   75mm by Chest CTA 2021   Family history of early CAD 05/17/2016   Hyperlipidemia    Mitral valve prolapse    with mild MR   Umbilical hernia     Past Surgical History:  Procedure Laterality Date   BRONCHIAL BIOPSY  09/06/2020   part of lobe removed (upper) Procedure: BRONCHIAL BIOPSIES;  Surgeon: Garner Nash, DO;  Location: [place];  Service: Pulmonary;;   BRONCHIAL BRUSHINGS  09/06/2020   Procedure: BRONCHIAL BRUSHINGS;  Surgeon: Garner Nash, DO;  Location: [place];  Service: Pulmonary;;   BRONCHIAL NEEDLE ASPIRATION BIOPSY  09/06/2020   Procedure: BRONCHIAL NEEDLE ASPIRATION BIOPSIES;  Surgeon: Garner Nash, DO;  Location: [place];  Service: Pulmonary;;   COLONOSCOPY     HERNIA REPAIR     INTERCOSTAL NERVE BLOCK Left 09/06/2020   Procedure: INTERCOSTAL NERVE BLOCK;  Surgeon: Lajuana Matte, MD;  Location: [facility];  Service: Thoracic;  Laterality: Left;   NECK SURGERY     PLANTAR FASCIA RELEASE     SCLEROTHERAPY  09/06/2020   Procedure: SCLEROTHERAPY;  Surgeon: Garner Nash,  DO;  Location: [place] [facility];  Service: Pulmonary;;   SEPTOPLASTY  6283   UMBILICAL HERNIA REPAIR  07/09/2012   Procedure: HERNIA REPAIR UMBILICAL ADULT;  Surgeon: Haywood Lasso, MD;  Location: [place];  Service: General;  Laterality: N/A;  repair umbilical hernia   VIDEO BRONCHOSCOPY WITH ENDOBRONCHIAL NAVIGATION Left 09/06/2020   Procedure: VIDEO BRONCHOSCOPY WITH ENDOBRONCHIAL NAVIGATION;  Surgeon: Garner Nash, DO;  Location: [place];  Service: Pulmonary;  Laterality: Left;  biopsies and fiducial dye marking, MB + ICG    There were no vitals filed for this visit.   Subjective Assessment - 03/22/21 1456     Subjective Pt states he has been doing HEP.                               [facility] Adult PT Treatment/Exercise - 03/22/21 0001       Exercises   Exercises Shoulder      Shoulder Exercises: Supine   Flexion 15 reps;AAROM    Flexion Limitations cane    Other Supine Exercises ER at 90/90 with cane x 10;      Shoulder Exercises: Standing   External Rotation 20 reps    Theraband Level (Shoulder External Rotation) Level  4 (Blue)    Internal Rotation 20 reps    Theraband Level (Shoulder Internal Rotation) Level 4 (Blue)    Flexion AROM;15 reps    Shoulder Flexion Weight (lbs) 2    Flexion Limitations scaption    ABduction AROM;15 reps    Shoulder ABduction Weight (lbs) 2    Row 20 reps    Theraband Level (Shoulder Row) Level 4 (Blue)    Other Standing Exercises Wall push ups x 20;      Shoulder Exercises: Stretch   Internal Rotation Stretch Limitations x15 with 2 hand hold      Manual Therapy   Manual Therapy Joint mobilization;Passive ROM    Joint Mobilization Post and inf GHJ mobs gr 3;    Passive ROM R shoulder, all motions; contract relax for IR and ER;                          PT Long Term Goals - 02/22/21 1531       PT LONG TERM GOAL #1   Title independent with HEP to maintain shoulder function     Time 6    Period Weeks    Status New    Target Date 04/05/21      PT LONG TERM GOAL #2   Title Improved right shoulder ER to allow patient to reach behind him and also throw without difficulty and 2/10 pain or less    Time 6    Period Weeks    Status New      PT LONG TERM GOAL #3   Title Patient able to reach behind his back with his right arm with thumb to L2 or higher to normalize function    Time 6    Period Weeks    Status New      PT LONG TERM GOAL #4   Title Able to reach right arm overhead in flexion to >= 160 deg to normalize ADLs      PT LONG TERM GOAL #5   Title Improved right shoulder strength to 5/5    Time 6    Period Weeks    Status New      Additional Long Term Goals   Additional Long Term Goals Yes      PT LONG TERM GOAL #6   Title Pt able to sleep on right side without waking from pain    Time 6    Period Weeks    Status New                   Plan - 03/22/21 1550     Clinical Impression Statement Pt with continued, mild stiffness at end range for rotation motions. Flexion improving, but still mildly lacking from L side. Behind the back IR improving with pain, but still limited with ROM. Pt doing well with strengthening, and has been able to do most regular, functional actvities and IADLs at home. Recommended referral to sports med in about 2 weeks, due to conitnued stiffness.    Examination-Activity Limitations Sleep;Other   throw   Stability/Clinical Decision Making Stable/Uncomplicated    Rehab Potential Excellent    PT Frequency 2x / week    PT Duration 6 weeks    PT Treatment/Interventions ADLs/Self Care Home Management;Cryotherapy;Electrical Stimulation;Iontophoresis 4mg /ml Dexamethasone;Moist Heat;Neuromuscular re-education;Therapeutic exercise;Therapeutic activities;Patient/family education;Manual techniques;Dry needling;Passive range of motion;Joint Manipulations;Spinal Manipulations    PT Next Visit Plan ROM, possible DN to right  subscap, lats  PT Home Exercise Plan [email or web address]    Consulted and Agree with Plan of Care Patient             Patient will benefit from skilled therapeutic intervention in order to improve the following deficits and impairments:  Decreased range of motion, Pain, Impaired UE functional use, Increased muscle spasms, Decreased strength, Impaired flexibility  Visit Diagnosis: Muscle weakness (generalized)  Stiffness of right shoulder, not elsewhere classified  Chronic right shoulder pain     Problem List Patient Active Problem List   Diagnosis Date Noted   Left upper lobe pulmonary nodule 09/06/2020   Stage I Lung cancer [facility]) s/p resection 09/2020 07/25/2020   Family history of early CAD 05/17/2016   MVP (mitral valve prolapse) 05/16/2016   Dilated aortic root ([place]) 05/27/2015   Diabetes mellitus without complication ([place])    Dyslipidemia associated with type 2 diabetes mellitus ([place])    Lyndee Hensen, PT, DPT 3:54 PM  03/22/21    [facility] [address] [place], [place], [address] Phone: [phone]   Fax:  [phone]  Name: MILLER LIMEHOUSE MRN: [id number] Date of Birth: 1960-12-02

## 2021-03-27 ENCOUNTER — Encounter: Payer: Self-pay | Admitting: Physical Therapy

## 2021-03-27 ENCOUNTER — Ambulatory Visit (INDEPENDENT_AMBULATORY_CARE_PROVIDER_SITE_OTHER): Payer: BC Managed Care – PPO | Admitting: Physical Therapy

## 2021-03-27 ENCOUNTER — Other Ambulatory Visit: Payer: Self-pay

## 2021-03-27 DIAGNOSIS — M6281 Muscle weakness (generalized): Secondary | ICD-10-CM | POA: Diagnosis not present

## 2021-03-27 DIAGNOSIS — M25611 Stiffness of right shoulder, not elsewhere classified: Secondary | ICD-10-CM | POA: Diagnosis not present

## 2021-03-27 NOTE — Therapy (Signed)
[facility] [address] [place], [place], [address] Phone: [phone]   Fax:  [phone]  Physical Therapy Treatment  Patient Details  Name: Preston Gamble MRN: [id number] Date of Birth: [birth year] Referring Provider (PT): Acquanetta Sit MD   Encounter Date: 03/27/2021   PT End of Session - 03/27/21 1008     Visit Number 9    Number of Visits 12    Date for PT Re-Evaluation 04/05/21    Authorization Type BCBS    PT Start Time 3419    PT Stop Time 0928    PT Time Calculation (min) 42 min    Activity Tolerance Patient tolerated treatment well    Behavior During Therapy [facility] for tasks assessed/performed             Past Medical History:  Diagnosis Date   Cancer ([place])    lung cancer- dx 06-2020   Chest pain    due to injury   Diabetes mellitus without complication ([place])    Dilated aortic root ([place]) 05/27/2015   71mm by Chest CTA 2021   Family history of early CAD 05/17/2016   Hyperlipidemia    Mitral valve prolapse    with mild MR   Umbilical hernia     Past Surgical History:  Procedure Laterality Date   BRONCHIAL BIOPSY  09/06/2020   part of lobe removed (upper) Procedure: BRONCHIAL BIOPSIES;  Surgeon: Garner Nash, DO;  Location: [place] [facility];  Service: Pulmonary;;   BRONCHIAL BRUSHINGS  09/06/2020   Procedure: BRONCHIAL BRUSHINGS;  Surgeon: Garner Nash, DO;  Location: [place];  Service: Pulmonary;;   BRONCHIAL NEEDLE ASPIRATION BIOPSY  09/06/2020   Procedure: BRONCHIAL NEEDLE ASPIRATION BIOPSIES;  Surgeon: Garner Nash, DO;  Location: [place];  Service: Pulmonary;;   COLONOSCOPY     HERNIA REPAIR     INTERCOSTAL NERVE BLOCK Left 09/06/2020   Procedure: INTERCOSTAL NERVE BLOCK;  Surgeon: Lajuana Matte, MD;  Location: [place];  Service: Thoracic;  Laterality: Left;   NECK SURGERY     PLANTAR FASCIA RELEASE     SCLEROTHERAPY  09/06/2020   Procedure: SCLEROTHERAPY;  Surgeon: Garner Nash,  DO;  Location: [place] [facility];  Service: Pulmonary;;   SEPTOPLASTY  6222   UMBILICAL HERNIA REPAIR  07/09/2012   Procedure: HERNIA REPAIR UMBILICAL ADULT;  Surgeon: Haywood Lasso, MD;  Location: [place];  Service: General;  Laterality: N/A;  repair umbilical hernia   VIDEO BRONCHOSCOPY WITH ENDOBRONCHIAL NAVIGATION Left 09/06/2020   Procedure: VIDEO BRONCHOSCOPY WITH ENDOBRONCHIAL NAVIGATION;  Surgeon: Garner Nash, DO;  Location: [place];  Service: Pulmonary;  Laterality: Left;  biopsies and fiducial dye marking, MB + ICG    There were no vitals filed for this visit.   Subjective Assessment - 03/27/21 1007     Subjective Pt states discomfort with trying to sleep fri and sat. Still sore with certain motions behind back and behind head/dressing.    Currently in Pain? No/denies    Pain Score 0-No pain                               [facility] Adult PT Treatment/Exercise - 03/27/21 0001       Exercises   Exercises Shoulder      Shoulder Exercises: Supine   Flexion 15 reps;AAROM    Flexion Limitations cane    Other Supine Exercises ER at 90/90  with cane x 10;      Shoulder Exercises: Standing   External Rotation 20 reps    Theraband Level (Shoulder External Rotation) Level 4 (Blue)    Internal Rotation 20 reps    Theraband Level (Shoulder Internal Rotation) Level 4 (Blue)    Flexion AROM;15 reps    Shoulder Flexion Weight (lbs) 2    Flexion Limitations scaption    ABduction AROM;15 reps    Shoulder ABduction Weight (lbs) 2    Row 20 reps    Theraband Level (Shoulder Row) Level 4 (Blue)    Other Standing Exercises Wall push ups x 20;    Other Standing Exercises throwing R arm x 15;      Shoulder Exercises: ROM/Strengthening   UBE (Upper Arm Bike) x4 min fwd/bwd      Shoulder Exercises: Stretch   Internal Rotation Stretch Limitations x15 with 2 hand hold      Manual Therapy   Manual Therapy Joint mobilization;Passive ROM    Joint  Mobilization Post and inf GHJ mobs gr 3;    Passive ROM R shoulder, all motions; contract relax for IR and ER;                          PT Long Term Goals - 02/22/21 1531       PT LONG TERM GOAL #1   Title independent with HEP to maintain shoulder function    Time 6    Period Weeks    Status New    Target Date 04/05/21      PT LONG TERM GOAL #2   Title Improved right shoulder ER to allow patient to reach behind him and also throw without difficulty and 2/10 pain or less    Time 6    Period Weeks    Status New      PT LONG TERM GOAL #3   Title Patient able to reach behind his back with his right arm with thumb to L2 or higher to normalize function    Time 6    Period Weeks    Status New      PT LONG TERM GOAL #4   Title Able to reach right arm overhead in flexion to >= 160 deg to normalize ADLs      PT LONG TERM GOAL #5   Title Improved right shoulder strength to 5/5    Time 6    Period Weeks    Status New      Additional Long Term Goals   Additional Long Term Goals Yes      PT LONG TERM GOAL #6   Title Pt able to sleep on right side without waking from pain    Time 6    Period Weeks    Status New                   Plan - 03/27/21 1009     Clinical Impression Statement Pt continues to have ROM limitations at end ranges for flex, IR, and ER. Pain overall improving but still has soreness wtih behind the back reaching for ER and IR. Has been able to improve strength, minimal strength deficits at this time. Pt making good progress but having difficulty regaining full ROM, and continues to have mild pain. Pt to see MD next week.    Examination-Activity Limitations Sleep;Other   throw   Stability/Clinical Decision Making Stable/Uncomplicated    Rehab Potential Excellent  PT Frequency 2x / week    PT Duration 6 weeks    PT Treatment/Interventions ADLs/Self Care Home Management;Cryotherapy;Electrical Stimulation;Iontophoresis 4mg /ml  Dexamethasone;Moist Heat;Neuromuscular re-education;Therapeutic exercise;Therapeutic activities;Patient/family education;Manual techniques;Dry needling;Passive range of motion;Joint Manipulations;Spinal Manipulations    PT Next Visit Plan ROM, possible DN to right subscap, lats    PT Home Exercise Plan [id number]    Consulted and Agree with Plan of Care Patient             Patient will benefit from skilled therapeutic intervention in order to improve the following deficits and impairments:  Decreased range of motion, Pain, Impaired UE functional use, Increased muscle spasms, Decreased strength, Impaired flexibility  Visit Diagnosis: Muscle weakness (generalized)  Stiffness of right shoulder, not elsewhere classified     Problem List Patient Active Problem List   Diagnosis Date Noted   Left upper lobe pulmonary nodule 09/06/2020   Stage I Lung cancer [facility]) s/p resection 09/2020 07/25/2020   Family history of early CAD 05/17/2016   MVP (mitral valve prolapse) 05/16/2016   Dilated aortic root ([place]) 05/27/2015   Diabetes mellitus without complication ([place])    Dyslipidemia associated with type 2 diabetes mellitus ([place])    Lyndee Hensen, PT, DPT 10:12 AM  03/27/21    [facility] [place], [place], [address] Phone: [phone]   Fax:  [phone]  Name: Preston Gamble MRN: [id number] Date of Birth: 01/05/1961

## 2021-03-29 ENCOUNTER — Encounter: Payer: Self-pay | Admitting: Physical Therapy

## 2021-03-29 ENCOUNTER — Ambulatory Visit (INDEPENDENT_AMBULATORY_CARE_PROVIDER_SITE_OTHER): Payer: BC Managed Care – PPO | Admitting: Physical Therapy

## 2021-03-29 DIAGNOSIS — M25611 Stiffness of right shoulder, not elsewhere classified: Secondary | ICD-10-CM | POA: Diagnosis not present

## 2021-03-29 DIAGNOSIS — M6281 Muscle weakness (generalized): Secondary | ICD-10-CM

## 2021-03-29 NOTE — Therapy (Addendum)
[facility] [address] [place], [place], [address] Phone: [phone]   Fax:  [phone]  Physical Therapy Treatment  Patient Details  Name: Preston Gamble MRN: [id number] Date of Birth: 1961/02/15 Referring Provider (PT): Acquanetta Sit MD   Encounter Date: 03/29/2021   PT End of Session - 03/29/21 1257     Visit Number 10    Number of Visits 12    Date for PT Re-Evaluation 04/05/21    Authorization Type BCBS    PT Start Time 0805    PT Stop Time 0848    PT Time Calculation (min) 43 min    Activity Tolerance Patient tolerated treatment well    Behavior During Therapy [facility] for tasks assessed/performed             Past Medical History:  Diagnosis Date   Cancer ([place])    lung cancer- dx 06-2020   Chest pain    due to injury   Diabetes mellitus without complication ([place])    Dilated aortic root ([place]) 05/27/2015   67m by Chest CTA 2021   Family history of early CAD 05/17/2016   Hyperlipidemia    Mitral valve prolapse    with mild MR   Umbilical hernia     Past Surgical History:  Procedure Laterality Date   BRONCHIAL BIOPSY  09/06/2020   part of lobe removed (upper) Procedure: BRONCHIAL BIOPSIES;  Surgeon: IGarner Nash DO;  [place]  Service: Pulmonary;;   BRONCHIAL BRUSHINGS  09/06/2020   Procedure: BRONCHIAL BRUSHINGS;  Surgeon: IGarner Nash DO;  [place]  Service: Pulmonary;;   BRONCHIAL NEEDLE ASPIRATION BIOPSY  09/06/2020   Procedure: BRONCHIAL NEEDLE ASPIRATION BIOPSIES;  Surgeon: IGarner Nash DO;  [place]  Service: Pulmonary;;   COLONOSCOPY     HERNIA REPAIR     INTERCOSTAL NERVE BLOCK Left 09/06/2020   Procedure: INTERCOSTAL NERVE BLOCK;  Surgeon: LLajuana Matte MD;  [place]  Service: Thoracic;  Laterality: Left;   NECK SURGERY     PLANTAR FASCIA RELEASE     SCLEROTHERAPY  09/06/2020   Procedure: SCLEROTHERAPY;  Surgeon: IGarner Nash DO;  [facility];  Service: Pulmonary;;   SEPTOPLASTY  12202  UMBILICAL HERNIA REPAIR  07/09/2012   Procedure: HERNIA REPAIR UMBILICAL ADULT;  Surgeon: CHaywood Lasso MD;  [place]  Service: General;  Laterality: N/A;  repair umbilical hernia   VIDEO BRONCHOSCOPY WITH ENDOBRONCHIAL NAVIGATION Left 09/06/2020   Procedure: VIDEO BRONCHOSCOPY WITH ENDOBRONCHIAL NAVIGATION;  Surgeon: IGarner Nash DO;  [place]  Service: Pulmonary;  Laterality: Left;  biopsies and fiducial dye marking, MB + ICG    There were no vitals filed for this visit.   Subjective Assessment - 03/29/21 1257     Subjective Pt states minimal pain in last couple days, just stiff/sore with certain motions, and behind the back.    Currently in Pain? No/denies    Pain Score 0-No pain                [facility] PT Assessment - 03/29/21 0001       AROM   Right Shoulder Flexion 150 Degrees    Right Shoulder ABduction 150 Degrees      PROM   Overall PROM Comments L shoulder flex 165    Right Shoulder Flexion 155 Degrees    Right Shoulder ABduction 165 Degrees    Right Shoulder  Internal Rotation 70 Degrees    Right Shoulder External Rotation 57 Degrees                           [facility] Adult PT Treatment/Exercise - 03/29/21 0001       Exercises   Exercises Shoulder      Shoulder Exercises: Supine   Flexion 15 reps;AAROM    Flexion Limitations cane    Other Supine Exercises ER at 90/90 and 45;  with cane x 10;      Shoulder Exercises: Standing   External Rotation 20 reps    Theraband Level (Shoulder External Rotation) Level 4 (Blue)    Internal Rotation 20 reps    Theraband Level (Shoulder Internal Rotation) Level 4 (Blue)    Flexion AROM;15 reps    Shoulder Flexion Weight (lbs) 2    Flexion Limitations scaption    ABduction AROM;15 reps    Shoulder ABduction Weight (lbs) 2    Row 20 reps    Theraband Level (Shoulder Row) Level 4  (Blue)    Other Standing Exercises Wall push ups x 20;    Other Standing Exercises --      Shoulder Exercises: ROM/Strengthening   UBE (Upper Arm Bike) x4 min fwd/bwd      Shoulder Exercises: Stretch   Internal Rotation Stretch Limitations x15 with 2 hand hold      Manual Therapy   Manual Therapy Joint mobilization;Passive ROM    Joint Mobilization Post and inf GHJ mobs gr 3;    Passive ROM R shoulder, all motions; contract relax for IR and ER;                          PT Long Term Goals - 02/22/21 1531       PT LONG TERM GOAL #1   Title independent with HEP to maintain shoulder function    Time 6    Period Weeks    Status New    Target Date 04/05/21      PT LONG TERM GOAL #2   Title Improved right shoulder ER to allow patient to reach behind him and also throw without difficulty and 2/10 pain or less    Time 6    Period Weeks    Status New      PT LONG TERM GOAL #3   Title Patient able to reach behind his back with his right arm with thumb to L2 or higher to normalize function    Time 6    Period Weeks    Status New      PT LONG TERM GOAL #4   Title Able to reach right arm overhead in flexion to >= 160 deg to normalize ADLs      PT LONG TERM GOAL #5   Title Improved right shoulder strength to 5/5    Time 6    Period Weeks    Status New      Additional Long Term Goals   Additional Long Term Goals Yes      PT LONG TERM GOAL #6   Title Pt able to sleep on right side without waking from pain    Time 6    Period Weeks    Status New                   Plan - 03/29/21 1258     Clinical Impression Statement Pt with  improved ROM measurements from previous weeks. He has good strength, and good ability for performing strengthening exercises. He has continued to focus on Mobility with HEP. He does have stiffness and pain at end of available range for flex, ER and behind the back IR, which continues to be the main limiting factor with progress.  Pt has made improvements , but still struggling to regain full end range motion, and still has bothersome pain. Pt to see MD on Monday.    Examination-Activity Limitations Sleep;Other   throw   Stability/Clinical Decision Making Stable/Uncomplicated    Rehab Potential Excellent    PT Frequency 2x / week    PT Duration 6 weeks    PT Treatment/Interventions ADLs/Self Care Home Management;Cryotherapy;Electrical Stimulation;Iontophoresis 48m/ml Dexamethasone;Moist Heat;Neuromuscular re-education;Therapeutic exercise;Therapeutic activities;Patient/family education;Manual techniques;Dry needling;Passive range of motion;Joint Manipulations;Spinal Manipulations    PT Next Visit Plan ROM, possible DN to right subscap, lats    PT Home Exercise Plan [id number]    Consulted and Agree with Plan of Care Patient             Patient will benefit from skilled therapeutic intervention in order to improve the following deficits and impairments:  Decreased range of motion, Pain, Impaired UE functional use, Increased muscle spasms, Decreased strength, Impaired flexibility  Visit Diagnosis: Muscle weakness (generalized)  Stiffness of right shoulder, not elsewhere classified     Problem List Patient Active Problem List   Diagnosis Date Noted   Left upper lobe pulmonary nodule 09/06/2020   Stage I Lung cancer ([facility] s/p resection 09/2020 07/25/2020   Family history of early CAD 05/17/2016   MVP (mitral valve prolapse) 05/16/2016   [place] 05/27/2015   Diabetes mellitus without complication ([place]    Dyslipidemia associated with type 2 diabetes mellitus ([place]     LLyndee Hensen PT, DPT 1:02 PM  03/29/21    [place] [address]Phone: [phone]  Fax:  [phone] Name: RHARISH BRAMMRN: [id number] of Birth: [birth year]  PHYSICAL THERAPY DISCHARGE SUMMARY  Visits from Start of Care:  10  Plan: Patient agrees to discharge.  Patient goals were partially met. Patient is being discharged due to referred back to MD due to continued pain     LLyndee Hensen PT, DPT 11:28 AM  07/11/21

## 2021-03-30 NOTE — Progress Notes (Signed)
Subjective:    CC: R shoulder pain  I, Preston Gamble, LAT, ATC, am serving as scribe for Dr. Lynne Leader.  HPI: Pt is a 60 y/o RHD male presenting w/ c/o R shoulder pain x one year that has been progressively worsened w/ no known MOI.  He was seen by his PCP for this issue on 01/17/21 and was prescribed oral diclofenac and later referred to PT of which he's completed 10 sessions.  He locates his pain to his R superior, lateral shoulder.  Radiating pain: No R shoulder mechanical symptoms: No Aggravating factors: shoulder aBD and ER at 90 deg; throwing a baseball Treatments tried: PT x10 visits; oral diclofenac;   Pertinent review of Systems: No fevers or chills  Relevant historical information: History stage I lung cancer status postresection March 2022   Objective:    Vitals:   04/02/21 0846  BP: 110/66  Pulse: 63  SpO2: 95%   General: Well Developed, well nourished, and in no acute distress.   MSK: Right shoulder normal-appearing Range of motion full abduction.  External rotation 80 degrees in the abducted position. Internal rotation limited to lumbar spine Strength intact. Positive empty can test. Negative Hawkins and Neer's test. Mildly positive crossover arm compression test. Negative clunk and relocation test. Pulses cap refill and sensation are intact distally.  Lab and Radiology Results  Diagnostic Limited MSK Ultrasound of: Right shoulder Biceps tendon intact normal-appearing Subscapularis tendon is intact. Supraspinatus tendon is intact with moderate subacromial bursitis. Infraspinatus tendon is thin but intact. AC joint narrowed and degenerative appearing Impression: Subacromial bursitis no definitive retracted rotator cuff tear visible.  X-ray right shoulder obtained today personally and independently interpreted Mild glenohumeral and AC DJD.  No acute fractures. Await formal radiology review    Impression and Recommendations:    Assessment  and Plan: 60 y.o. male with right shoulder pain.  Multifactorial.  Pain thought to be due to possible glenohumeral DJD and subacromial impingement/bursitis.  Patient has had extensive trial of physical therapy with some improvement but continued bothersome symptoms.  He has lack of range of motion which is concerning.  Plan to proceed to MRI.  This should help determine future treatment options including possible surgery or injections.  Additionally his relatively recent cancer history also would indicate earlier MRI.Marland Kitchen If MRI results show obvious surgical problem refer directly to orthopedic surgery if not check back with me.  PDMP not reviewed this encounter. Orders Placed This Encounter  Procedures   [place] LIMITED JOINT SPACE STRUCTURES UP RIGHT(NO LINKED CHARGES)    Order Specific Question:   Reason for Exam (SYMPTOM  OR DIAGNOSIS REQUIRED)    Answer:   R shoulder pain    Order Specific Question:   Preferred imaging location?    Answer:   [facility]   DG Shoulder Right    Standing Status:   Future    Number of Occurrences:   1    Standing Expiration Date:   05/02/2021    Order Specific Question:   Reason for Exam (SYMPTOM  OR DIAGNOSIS REQUIRED)    Answer:   R shoulder pain    Order Specific Question:   Preferred imaging location?    Answer:   [facility]   MR SHOULDER RIGHT WO CONTRAST    Standing Status:   Future    Standing Expiration Date:   04/02/2022    Order Specific Question:   What is the patient's sedation requirement?  Answer:   No Sedation    Order Specific Question:   Does the patient have a pacemaker or implanted devices?    Answer:   No    Order Specific Question:   Preferred imaging location?    Answer:   [facility]process development [facility] (table limit-350lbs)   No orders of the defined types were placed in this encounter.   Discussed warning signs or symptoms. Please see discharge instructions. Patient expresses understanding.   The above  documentation has been reviewed and is accurate and complete Lynne Leader, M.D.

## 2021-04-02 ENCOUNTER — Ambulatory Visit (INDEPENDENT_AMBULATORY_CARE_PROVIDER_SITE_OTHER): Payer: BC Managed Care – PPO | Admitting: Family Medicine

## 2021-04-02 ENCOUNTER — Ambulatory Visit: Payer: Self-pay

## 2021-04-02 ENCOUNTER — Ambulatory Visit (INDEPENDENT_AMBULATORY_CARE_PROVIDER_SITE_OTHER): Payer: BC Managed Care – PPO

## 2021-04-02 ENCOUNTER — Encounter: Payer: Self-pay | Admitting: Family Medicine

## 2021-04-02 ENCOUNTER — Other Ambulatory Visit: Payer: Self-pay

## 2021-04-02 VITALS — BP 110/66 | HR 63 | Ht 74.0 in | Wt 243.0 lb

## 2021-04-02 DIAGNOSIS — M25511 Pain in right shoulder: Secondary | ICD-10-CM

## 2021-04-02 DIAGNOSIS — G8929 Other chronic pain: Secondary | ICD-10-CM

## 2021-04-02 DIAGNOSIS — M19011 Primary osteoarthritis, right shoulder: Secondary | ICD-10-CM | POA: Diagnosis not present

## 2021-04-02 NOTE — Patient Instructions (Signed)
Thank you for coming in today.   Please get an Xray today before you leave   You should hear from MRI scheduling within 1 week. If you do not hear please let me know.    Depending on the MRI results either surgery referral or check back with me.

## 2021-04-03 ENCOUNTER — Encounter: Payer: BC Managed Care – PPO | Admitting: Physical Therapy

## 2021-04-03 ENCOUNTER — Other Ambulatory Visit: Payer: Self-pay | Admitting: *Deleted

## 2021-04-03 DIAGNOSIS — Z9889 Other specified postprocedural states: Secondary | ICD-10-CM

## 2021-04-03 DIAGNOSIS — R911 Solitary pulmonary nodule: Secondary | ICD-10-CM

## 2021-04-03 NOTE — Progress Notes (Signed)
Right shoulder x-ray shows some mild arthritis in the shoulder joint.

## 2021-04-05 ENCOUNTER — Encounter: Payer: BC Managed Care – PPO | Admitting: Physical Therapy

## 2021-04-05 ENCOUNTER — Telehealth: Payer: Self-pay

## 2021-04-05 MED ORDER — LORAZEPAM 0.5 MG PO TABS: ORAL_TABLET | ORAL | 0 refills | Status: DC

## 2021-04-05 NOTE — Telephone Encounter (Signed)
Pt would like a medication to ease his anxiety prior to his MRI, which is scheduled for Sunday.

## 2021-04-05 NOTE — Telephone Encounter (Signed)
Medicine sent. Do not drive with this medicine.

## 2021-04-05 NOTE — Addendum Note (Signed)
Addended by: Gregor Hams on: 04/05/2021 10:52 AM   Modules accepted: Orders

## 2021-04-08 ENCOUNTER — Other Ambulatory Visit: Payer: Self-pay

## 2021-04-08 ENCOUNTER — Ambulatory Visit (INDEPENDENT_AMBULATORY_CARE_PROVIDER_SITE_OTHER): Payer: BC Managed Care – PPO

## 2021-04-08 DIAGNOSIS — G8929 Other chronic pain: Secondary | ICD-10-CM

## 2021-04-08 DIAGNOSIS — M25511 Pain in right shoulder: Secondary | ICD-10-CM

## 2021-04-09 ENCOUNTER — Ambulatory Visit
Admission: RE | Admit: 2021-04-09 | Discharge: 2021-04-09 | Disposition: A | Discharge status: Home / Self Care | Payer: BC Managed Care – PPO | Source: Ambulatory Visit | Attending: Thoracic Surgery (Cardiothoracic Vascular Surgery) | Admitting: Thoracic Surgery (Cardiothoracic Vascular Surgery)

## 2021-04-09 DIAGNOSIS — J9811 Atelectasis: Secondary | ICD-10-CM | POA: Diagnosis not present

## 2021-04-09 DIAGNOSIS — C3490 Malignant neoplasm of unspecified part of unspecified bronchus or lung: Secondary | ICD-10-CM | POA: Diagnosis not present

## 2021-04-09 DIAGNOSIS — Z9889 Other specified postprocedural states: Secondary | ICD-10-CM

## 2021-04-09 DIAGNOSIS — R911 Solitary pulmonary nodule: Secondary | ICD-10-CM

## 2021-04-10 ENCOUNTER — Encounter: Payer: BC Managed Care – PPO | Admitting: Physical Therapy

## 2021-04-10 NOTE — Progress Notes (Signed)
MRI shoulder shows a few problems.  It shows rotator cuff tendinitis as well as evidence of adhesive capsulitis (frozen shoulder).  Additionally there is a labrum tear with a perilabral cyst that extends into a notch where a nerve goes through that could be compressed that causes some shoulder weakness and pain as well. Recommend return to clinic to go over this result in full detail and discuss treatment plan and options going forward from here.

## 2021-04-11 NOTE — Progress Notes (Signed)
I, Preston Gamble, LAT, ATC, am serving as scribe for Dr. Lynne Leader.  Preston Gamble is a 60 y.o. male who presents to [place] at [facility] today for f/u of chronic R shoulder pain and R shoulder MRI review.  He was last seen by Dr. Georgina Snell on 04/02/21 after having completed 10 PT sessions.  He was referred for a R shoulder MRI that he had on 04/08/21.  Today, pt reports R shoulder is still pretty painful. Pt c/o difficulty sleeping.  He notes he cannot throw a baseball and this is bothering him.  He lacks the ability to fully externally rotate her shoulder with his arm in the abducted position.  He is willing to consider surgery.  Diagnostic testing: R shoulder MRI- 04/08/21; R shoulder XR- 04/02/21  Pertinent review of systems: No fevers or chills  Relevant historical information: History lung cancer stage I resection March 2022.  Diabetes.   Exam:  BP 132/84   Pulse 63   Ht 6\' 2"  (1.88 m)   Wt 244 lb 3.2 oz (110.8 kg)   SpO2 96%   BMI 31.35 kg/m  General: Well Developed, well nourished, and in no acute distress.   MSK: Right shoulder normal-appearing Nontender. Range of motion external rotation with arm 80 ducted 30 degrees by neutral position with arm abducted unable to externally rotate to 90 degrees. Internal rotation lumbar spine abduction 140 degrees. Strength intact to external rotation internal rotation and abduction. Positive impingement testing.    Lab and Radiology Results EXAM: MRI OF THE RIGHT SHOULDER WITHOUT CONTRAST   TECHNIQUE: Multiplanar, multisequence MR imaging of the shoulder was performed. No intravenous contrast was administered.   COMPARISON:  Xray shoulder 04/02/2021.   FINDINGS: Rotator cuff: Mild tendinosis of the supraspinatus tendon with fraying along bursal surface. Mild tendinosis of the infraspinatus tendon. Teres minor tendon is intact. Subscapularis tendon is intact.   Muscles: No atrophy or abnormal signal  of the muscles of the rotator cuff.   Biceps long head:  Intact and normally positioned.   Acromioclavicular Joint: Mild arthropathy of the acromioclavicular joint. Type II acromion. No subacromial/subdeltoid bursal fluid.   Glenohumeral Joint: No joint effusion. No chondral defect. Thickening of the inferior joint capsule as can be seen with adhesive capsulitis.   Labrum: Posterosuperior labral tear. There is a 2.1 x 1.2 cm multiloculated paralabral cyst extending into the spinoglenoid notch.   Bones:  No marrow abnormality, fracture or dislocation.   Other: None.   IMPRESSION: 1. Mild tendinosis of the supraspinatus tendon with fraying along bursal surface. 2. Mild tendinosis of the infraspinatus tendon. 3. Thickening of the inferior joint capsule as can be seen with adhesive capsulitis. 4. Posterosuperior labral tear. 2.1 x 1.2 cm multiloculated paralabral cyst extending into the spinoglenoid notch.     Electronically Signed   By: Kathreen Devoid M.D.   On: 04/09/2021 15:30   I, Lynne Leader, personally (independently) visualized and performed the interpretation of the images attached in this note.     Assessment and Plan: 60 y.o. male with right shoulder pain and lack of range of motion.  Patient has had trial of conservative management including to physical therapy without great results.  His pain is complicated and multifactorial.  My main concern is the paralabral cyst extending into the spinoglenoid notch.  This currently does not appear to be compressing or irritating suprascapular nerve but that is the chief concern of mine for the future.  Additionally he has  symptoms consistent with adhesive capsulitis and impingement.  I think best bet at this point is surgical consultation.  I am not optimistic that injection or aspiration injection would be very helpful long-term.  Although I am happy to proceed to aspiration and injection of the paralabral cyst if orthopedic surgery  would like me to attempt to prior to surgery.  The multiloculated appearance of the cyst is unlikely to be fully aspirated easily.  Reviewed MRI findings discussed treatment plan and options with patient he expresses agreement for surgical consultation and referral. Total encounter time 30 minutes including face-to-face time with the patient and, reviewing past medical record, and charting on the date of service.      PDMP not reviewed this encounter. Orders Placed This Encounter  Procedures   Ambulatory referral to Orthopedic Surgery    Referral Priority:   Routine    Referral Type:   Surgical    Referral Reason:   Specialty Services Required    Requested Specialty:   Orthopedic Surgery    Number of Visits Requested:   1   No orders of the defined types were placed in this encounter.    Discussed warning signs or symptoms. Please see discharge instructions. Patient expresses understanding.   The above documentation has been reviewed and is accurate and complete Lynne Leader, M.D.

## 2021-04-12 ENCOUNTER — Ambulatory Visit (INDEPENDENT_AMBULATORY_CARE_PROVIDER_SITE_OTHER): Payer: BC Managed Care – PPO | Admitting: Family Medicine

## 2021-04-12 ENCOUNTER — Encounter: Payer: BC Managed Care – PPO | Admitting: Physical Therapy

## 2021-04-12 ENCOUNTER — Other Ambulatory Visit: Payer: Self-pay

## 2021-04-12 VITALS — BP 132/84 | HR 63 | Ht 74.0 in | Wt 244.2 lb

## 2021-04-12 DIAGNOSIS — M25511 Pain in right shoulder: Secondary | ICD-10-CM

## 2021-04-12 DIAGNOSIS — G8929 Other chronic pain: Secondary | ICD-10-CM | POA: Diagnosis not present

## 2021-04-12 NOTE — Patient Instructions (Addendum)
Thank you for coming in today.   I've referred you for a surgical consultation. Please let me know if you don't hear about scheduling this visit within about a week.  Recheck back as needed.

## 2021-04-16 ENCOUNTER — Encounter: Payer: BC Managed Care – PPO | Admitting: Physical Therapy

## 2021-04-17 ENCOUNTER — Ambulatory Visit: Payer: BC Managed Care – PPO | Admitting: Orthopaedic Surgery

## 2021-04-18 ENCOUNTER — Encounter: Payer: BC Managed Care – PPO | Admitting: Physical Therapy

## 2021-04-18 ENCOUNTER — Encounter: Payer: Self-pay | Admitting: Orthopedic Surgery

## 2021-04-18 ENCOUNTER — Ambulatory Visit (INDEPENDENT_AMBULATORY_CARE_PROVIDER_SITE_OTHER): Payer: BC Managed Care – PPO | Admitting: Orthopedic Surgery

## 2021-04-18 ENCOUNTER — Other Ambulatory Visit: Payer: Self-pay

## 2021-04-18 DIAGNOSIS — S46111A Strain of muscle, fascia and tendon of long head of biceps, right arm, initial encounter: Secondary | ICD-10-CM | POA: Diagnosis not present

## 2021-04-18 NOTE — Progress Notes (Signed)
Office Visit Note   Patient: Preston Gamble           Date of Birth: 01-19-1961           MRN: [id number] Visit Date: 04/18/2021 Requested by: Gregor Hams, MD [place],  [place] [id number] PCP: Vivi Barrack, MD  Subjective: Chief Complaint  Patient presents with   Right Shoulder - Pain    HPI: Preston Gamble is a 60 year old patient with 1 year history of right shoulder pain.  Hard for him to lay on the right-hand side.  The shoulder pain has been gradual.  Describes pain and difficulty with AB duction and external rotation.  Describes "a little weakness".  He is able to play golf.  However he cannot really throw because of the pain.  Medication over the summer helped a little which was anti-inflammatories.  He had 10 visits of physical therapy which helped some as well.  Patient works in [employer].  He actually has been able to perform fairly vigorous activities such as chopping wood.  He does walk daily and does not really do strengthening exercises of the upper extremity in general for fitness.  He does have a history of lung cancer with left upper lobe excision performed earlier this year which was about 15% of his lung.  Overall he is doing well from that procedure.              ROS: All systems reviewed are negative as they relate to the chief complaint within the history of present illness.  Patient denies  fevers or chills.   Assessment & Plan: Visit Diagnoses: No diagnosis found.  Plan: Impression is right shoulder spinoglenoid notch cyst seen on none arthrogram MRI scan.  Labral tear is the likely culprit.  He does not have much profound weakness on examination which I would expect with a multilobulated cyst of this size.  Nonetheless it is the only abnormality on his scan which could explain his progressive mildly vague symptoms.  He does not really report any weakness in the rotator cuff is intact on the scan.  Slight restriction of range of motion could be very early  frozen shoulder but he does have full forward flexion on both sides.  I think the cyst is the likely culprit for his symptoms.  Discussed operative and nonoperative treatment options.  Aspiration of the cyst would be difficult and somewhat unpredictable due to the multi loculated nature of the cyst.  Arthroscopic surgery with biceps tendon release labral debridement and cauterization of the stalk would give him his best option at pain relief.  Biceps tenodesis also to be performed.  Risk and benefits are discussed including not limited to infection shoulder stiffness incomplete pain relief as well as a 6 to 10-week recovery time for cyst resolution and symptom resolution.  Anticipate sling use for only a week but he would need to avoid active biceps resistance work for least 6 weeks after surgery.  Patient understands risk and benefits and wishes to proceed.  He will call Jackelyn Poling when he wants to schedule which will likely be sometime in November.  Follow-Up Instructions: Return if symptoms worsen or fail to improve.   Orders:  No orders of the defined types were placed in this encounter.  No orders of the defined types were placed in this encounter.     Procedures: No procedures performed   Clinical Data: No additional findings.  Objective: Vital Signs: There were no vitals  taken for this visit.  Physical Exam: Ortho exam demonstrates good cervical spine range of motion.  5 out of 5 grip EPL FPL interosseous wrist flexion extension bicep triceps and deltoid strength.  External rotation strength is symmetric between sides.  O'Brien's testing negative on the right.  No discrete AC joint tenderness is present.  No coarse grinding or crepitus present with active or passive range of motion of the right shoulder.  Labral load-and-shift testing negative for reproduce pathology.  No masses lymphadenopathy or skin changes noted in that shoulder girdle region.  There is about a 10 degree difference in  abduction and external rotation on the right compared to the left but no passive difference in external rotation which is about 70 degrees bilaterally.  Ortho Exam:   Constitutional: Patient appears well-developed HEENT:  Head: Normocephalic Eyes:EOM are normal Neck: Normal range of motion Cardiovascular: Normal rate Pulmonary/chest: Effort normal Neurologic: Patient is alert Skin: Skin is warm Psychiatric: Patient has normal mood and affect   Specialty Comments:  No specialty comments available.  Imaging: No results found.   PMFS History: Patient Active Problem List   Diagnosis Date Noted   Left upper lobe pulmonary nodule 09/06/2020   Stage I Lung cancer [facility]) s/p resection 09/2020 07/25/2020   Family history of early CAD 05/17/2016   MVP (mitral valve prolapse) 05/16/2016   Dilated aortic root ([place]) 05/27/2015   Diabetes mellitus without complication (HCC)    Dyslipidemia associated with type 2 diabetes mellitus (HCC)    Past Medical History:  Diagnosis Date   Cancer ([place])    lung cancer- dx 06-2020   Chest pain    due to injury   Diabetes mellitus without complication ([place])    Dilated aortic root ([place]) 05/27/2015   44mm by Chest CTA 2021   Family history of early CAD 05/17/2016   Hyperlipidemia    Mitral valve prolapse    with mild MR   Umbilical hernia     Family History  Problem Relation Age of Onset   CAD Father    Diabetes Father    Hyperlipidemia Sister    Diabetes Sister    Diabetes Maternal Grandmother    Diabetes Maternal Grandfather    Diabetes Paternal Grandmother    Diabetes Paternal Grandfather    Colon cancer Neg Hx    Pancreatic cancer Neg Hx    Stomach cancer Neg Hx    Esophageal cancer Neg Hx    Rectal cancer Neg Hx     Past Surgical History:  Procedure Laterality Date   BRONCHIAL BIOPSY  09/06/2020   part of lobe removed (upper) Procedure: BRONCHIAL BIOPSIES;  Surgeon: Garner Nash, DO;  Location: [facility];  Service:  Pulmonary;;   BRONCHIAL BRUSHINGS  09/06/2020   Procedure: BRONCHIAL BRUSHINGS;  Surgeon: Garner Nash, DO;  Location: [facility];  Service: Pulmonary;;   BRONCHIAL NEEDLE ASPIRATION BIOPSY  09/06/2020   Procedure: BRONCHIAL NEEDLE ASPIRATION BIOPSIES;  Surgeon: Garner Nash, DO;  Location: [facility];  Service: Pulmonary;;   COLONOSCOPY     HERNIA REPAIR     INTERCOSTAL NERVE BLOCK Left 09/06/2020   Procedure: INTERCOSTAL NERVE BLOCK;  Surgeon: Lajuana Matte, MD;  Location: [facility];  Service: Thoracic;  Laterality: Left;   NECK SURGERY     PLANTAR FASCIA RELEASE     SCLEROTHERAPY  09/06/2020   Procedure: SCLEROTHERAPY;  Surgeon: Garner Nash, DO;  Location: [facility];  Service: Pulmonary;;   SEPTOPLASTY  1994  UMBILICAL HERNIA REPAIR  07/09/2012   Procedure: HERNIA REPAIR UMBILICAL ADULT;  Surgeon: Haywood Lasso, MD;  Location: [facility];  Service: General;  Laterality: N/A;  repair umbilical hernia   VIDEO BRONCHOSCOPY WITH ENDOBRONCHIAL NAVIGATION Left 09/06/2020   Procedure: VIDEO BRONCHOSCOPY WITH ENDOBRONCHIAL NAVIGATION;  Surgeon: Garner Nash, DO;  Location: [facility];  Service: Pulmonary;  Laterality: Left;  biopsies and fiducial dye marking, MB + ICG   Social History   Occupational History   Not on file  Tobacco Use   Smoking status: Former    Types: Cigarettes    Quit date: 05/20/1997    Years since quitting: 23.9   Smokeless tobacco: Never  Vaping Use   Vaping Use: Never used  Substance and Sexual Activity   Alcohol use: Yes    Alcohol/week: 3.0 standard drinks    Types: 3 Cans of beer per week    Comment: occasional   Drug use: No   Sexual activity: Not on file

## 2021-04-20 ENCOUNTER — Ambulatory Visit (INDEPENDENT_AMBULATORY_CARE_PROVIDER_SITE_OTHER): Payer: BC Managed Care – PPO | Admitting: Thoracic Surgery (Cardiothoracic Vascular Surgery)

## 2021-04-20 ENCOUNTER — Other Ambulatory Visit: Payer: Self-pay

## 2021-04-20 DIAGNOSIS — C3490 Malignant neoplasm of unspecified part of unspecified bronchus or lung: Secondary | ICD-10-CM | POA: Diagnosis not present

## 2021-04-20 NOTE — Progress Notes (Signed)
     [address]       [place] [address]             [phone]       Patient: Home Provider: Office Consent for Telemedicine visit obtained.  Today's visit was completed via a real-time telehealth (see specific modality noted below). The patient/authorized person provided oral consent at the time of the visit to engage in a telemedicine encounter with the present provider at [facility]. The patient/authorized person was informed of the potential benefits, limitations, and risks of telemedicine. The patient/authorized person expressed understanding that the laws that protect confidentiality also apply to telemedicine. The patient/authorized person acknowledged understanding that telemedicine does not provide emergency services and that he or she would need to call 911 or proceed to the nearest hospital for help if such a need arose.   Total time spent in the clinical discussion 10 minutes.  Telehealth Modality: Phone visit (audio only)  I had a telephone visit with Mr. Preston Gamble.  He is a 60 year old male that underwent a left upper lobectomy for stage Ia adenocarcinoma the lung in March 2022.  Overall he is doing well.  He has no complaints and denies any shortness of breath.  I personally reviewed his CT scan from April 09, 2021.  There is no evidence of any new or recurrent disease.  We will schedule him for another CT chest in 6 months and also refer him to our cancer center for ongoing surveillance.  Loney Domingo Bary Leriche

## 2021-04-23 ENCOUNTER — Encounter: Payer: BC Managed Care – PPO | Admitting: Physical Therapy

## 2021-04-23 ENCOUNTER — Telehealth: Payer: Self-pay | Admitting: Internal Medicine

## 2021-04-23 NOTE — Telephone Encounter (Signed)
Scheduled appt per 10/14 referral. Spoke to pt, he said to schedule him for an appt and he will get it from [facility]. I scheduled appt and gave pt our information to call back if appt doesn't work for him. He voiced his understanding.

## 2021-05-04 DIAGNOSIS — M7501 Adhesive capsulitis of right shoulder: Secondary | ICD-10-CM | POA: Diagnosis not present

## 2021-05-09 DIAGNOSIS — M7501 Adhesive capsulitis of right shoulder: Secondary | ICD-10-CM | POA: Diagnosis not present

## 2021-05-09 DIAGNOSIS — E559 Vitamin D deficiency, unspecified: Secondary | ICD-10-CM | POA: Diagnosis not present

## 2021-05-09 DIAGNOSIS — Z6832 Body mass index (BMI) 32.0-32.9, adult: Secondary | ICD-10-CM | POA: Diagnosis not present

## 2021-05-09 DIAGNOSIS — M6281 Muscle weakness (generalized): Secondary | ICD-10-CM | POA: Diagnosis not present

## 2021-05-09 DIAGNOSIS — M25511 Pain in right shoulder: Secondary | ICD-10-CM | POA: Diagnosis not present

## 2021-05-09 DIAGNOSIS — E785 Hyperlipidemia, unspecified: Secondary | ICD-10-CM | POA: Diagnosis not present

## 2021-05-09 DIAGNOSIS — M25611 Stiffness of right shoulder, not elsewhere classified: Secondary | ICD-10-CM | POA: Diagnosis not present

## 2021-05-09 DIAGNOSIS — E1165 Type 2 diabetes mellitus with hyperglycemia: Secondary | ICD-10-CM | POA: Diagnosis not present

## 2021-05-11 ENCOUNTER — Other Ambulatory Visit: Payer: Self-pay | Admitting: Medical Oncology

## 2021-05-11 DIAGNOSIS — C3490 Malignant neoplasm of unspecified part of unspecified bronchus or lung: Secondary | ICD-10-CM

## 2021-05-14 ENCOUNTER — Inpatient Hospital Stay (HOSPITAL_BASED_OUTPATIENT_CLINIC_OR_DEPARTMENT_OTHER): Payer: BC Managed Care – PPO | Admitting: *Deleted

## 2021-05-14 ENCOUNTER — Encounter: Payer: Self-pay | Admitting: Internal Medicine

## 2021-05-14 ENCOUNTER — Inpatient Hospital Stay: Payer: BC Managed Care – PPO | Attending: Internal Medicine | Admitting: Internal Medicine

## 2021-05-14 ENCOUNTER — Other Ambulatory Visit: Payer: Self-pay

## 2021-05-14 ENCOUNTER — Inpatient Hospital Stay: Payer: BC Managed Care – PPO

## 2021-05-14 VITALS — BP 123/79 | HR 66 | Temp 97.8°F | Resp 18 | Ht 74.0 in | Wt 243.9 lb

## 2021-05-14 DIAGNOSIS — C3490 Malignant neoplasm of unspecified part of unspecified bronchus or lung: Secondary | ICD-10-CM

## 2021-05-14 DIAGNOSIS — M25511 Pain in right shoulder: Secondary | ICD-10-CM | POA: Diagnosis not present

## 2021-05-14 DIAGNOSIS — M6281 Muscle weakness (generalized): Secondary | ICD-10-CM | POA: Diagnosis not present

## 2021-05-14 DIAGNOSIS — E785 Hyperlipidemia, unspecified: Secondary | ICD-10-CM | POA: Diagnosis not present

## 2021-05-14 DIAGNOSIS — M25611 Stiffness of right shoulder, not elsewhere classified: Secondary | ICD-10-CM | POA: Diagnosis not present

## 2021-05-14 DIAGNOSIS — Z87891 Personal history of nicotine dependence: Secondary | ICD-10-CM

## 2021-05-14 DIAGNOSIS — Z7982 Long term (current) use of aspirin: Secondary | ICD-10-CM | POA: Insufficient documentation

## 2021-05-14 DIAGNOSIS — Z7984 Long term (current) use of oral hypoglycemic drugs: Secondary | ICD-10-CM | POA: Insufficient documentation

## 2021-05-14 DIAGNOSIS — Z85118 Personal history of other malignant neoplasm of bronchus and lung: Secondary | ICD-10-CM | POA: Diagnosis not present

## 2021-05-14 DIAGNOSIS — E119 Type 2 diabetes mellitus without complications: Secondary | ICD-10-CM | POA: Diagnosis not present

## 2021-05-14 DIAGNOSIS — C3412 Malignant neoplasm of upper lobe, left bronchus or lung: Secondary | ICD-10-CM

## 2021-05-14 DIAGNOSIS — M7501 Adhesive capsulitis of right shoulder: Secondary | ICD-10-CM | POA: Diagnosis not present

## 2021-05-14 DIAGNOSIS — Z79899 Other long term (current) drug therapy: Secondary | ICD-10-CM | POA: Diagnosis not present

## 2021-05-14 DIAGNOSIS — Z902 Acquired absence of lung [part of]: Secondary | ICD-10-CM | POA: Diagnosis not present

## 2021-05-14 LAB — CMP (CANCER CENTER ONLY)
ALT: 40 U/L (ref 0–44)
AST: 19 U/L (ref 15–41)
Albumin: 4.3 g/dL (ref 3.5–5.0)
Alkaline Phosphatase: 121 U/L (ref 38–126)
Anion gap: 9 (ref 5–15)
BUN: 17 mg/dL (ref 6–20)
CO2: 23 mmol/L (ref 22–32)
Calcium: 8.9 mg/dL (ref 8.9–10.3)
Chloride: 105 mmol/L (ref 98–111)
Creatinine: 1.06 mg/dL (ref 0.61–1.24)
GFR, Estimated: >60 mL/min (ref >60)
Glucose, Bld: 269 mg/dL — ABNORMAL HIGH (ref 70–99)
Potassium: 4.1 mmol/L (ref 3.5–5.1)
Sodium: 137 mmol/L (ref 135–145)
Total Bilirubin: 1.5 mg/dL — ABNORMAL HIGH (ref 0.3–1.2)
Total Protein: 6.8 g/dL (ref 6.5–8.1)

## 2021-05-14 LAB — CBC WITH DIFFERENTIAL (CANCER CENTER ONLY)
Abs Immature Granulocytes: 0.03 10*3/uL (ref 0.00–0.07)
Basophils Absolute: 0.1 10*3/uL (ref 0.0–0.1)
Basophils Relative: 1 %
Eosinophils Absolute: 0.2 10*3/uL (ref 0.0–0.5)
Eosinophils Relative: 2 %
HCT: 46.7 % (ref 39.0–52.0)
Hemoglobin: 17 g/dL (ref 13.0–17.0)
Immature Granulocytes: 0 %
Lymphocytes Relative: 17 %
Lymphs Abs: 1.4 10*3/uL (ref 0.7–4.0)
MCH: 29.3 pg (ref 26.0–34.0)
MCHC: 36.4 g/dL — ABNORMAL HIGH (ref 30.0–36.0)
MCV: 80.5 fL (ref 80.0–100.0)
Monocytes Absolute: 0.5 10*3/uL (ref 0.1–1.0)
Monocytes Relative: 6 %
Neutro Abs: 5.9 10*3/uL (ref 1.7–7.7)
Neutrophils Relative %: 74 %
Platelet Count: 237 10*3/uL (ref 150–400)
RBC: 5.8 MIL/uL (ref 4.22–5.81)
RDW: 12.9 % (ref 11.5–15.5)
WBC Count: 8 10*3/uL (ref 4.0–10.5)
nRBC: 0 % (ref 0.0–0.2)

## 2021-05-14 NOTE — Progress Notes (Signed)
[facility] [phone]   [phone]  CONSULT NOTE  REFERRING PHYSICIAN: Dr. Melodie Bouillon  REASON FOR CONSULTATION:  60 years old white male recently diagnosed with lung cancer.  HPI Preston Gamble is a 60 y.o. male with past medical history significant for diabetes mellitus, dyslipidemia, mitral valve prolapse as well as remote history of smoking but quit 25 years ago.  The patient CT angiogram of the chest on July 06, 2019 for follow-up of thoracic aortic aneurysm and it showed incidental finding of 1.0 cm groundglass nodule in the left upper lobe.  This was followed by observation and repeat CT scan of the chest on June 19, 2020 showed slight interval increase in the size of the soft solid nodule of the peripheral left upper lobe measuring 1.1 x 0.9 cm with interval increase in solidity.  The findings were concerning for adenocarcinoma.  The patient had a PET scan on July 14, 2020 and that showed no hypermetabolism in the left upper lobe pulmonary nodule but given the small size of this lesion and soft solid components neoplasm was not excluded.  There was no other unexpected or suspicious hypermetabolic disease in the neck, chest, abdomen or pelvis.  CT super D of the chest on 09/01/2020 showed the soft solid nodule within the lateral left upper lobe unchanged in size and measuring 1.1 cm. On September 06, 2020 the patient underwent flexible video fiberoptic bronchoscopy with electromagnetic navigation and biopsies under the care of Dr. Valeta Harms and the final pathology showed atypical cells.  In the same setting the patient underwent robotic assisted left video thoracoscopy with left upper lobe wedge resection and left upper lobectomy with mediastinal lymph node sampling under the care of Dr. Kipp Brood. The final pathology [id number]) showed invasive adenocarcinoma measuring 1.1 cm with acinar predominant.  The dissected lymph nodes were  negative for malignancy.  There was no evidence for lymphovascular invasion or visceral pleural invasion and all the margins were negative for malignancy. The patient was seen for follow-up by Dr. Kipp Brood and most recent CT scan of the chest on April 09, 2021 showed no concerning findings for disease recurrence. Dr. Kipp Brood kindly referred the patient to me today for evaluation and close monitoring of his disease. When seen today he is feeling fine with no concerning complaints.  He is very active and walks 3-4 miles every day.  We will he has no chest pain, shortness of breath, cough or hemoptysis.  He denied having any fever or chills.  He has no nausea, vomiting, diarrhea or constipation.  He denied having any significant weight loss or night sweats.  He has no headache or visual changes. Family history significant for mother with dementia and father with heart disease. The patient is married and has 3 children.  He works on [employer].  He has a history of smoking for around 16 years but quit 25 years ago.  The patient drinks alcohol socially and no history of drug abuse.  HPI  Past Medical History:  Diagnosis Date   Cancer [facility])    lung cancer- dx 06-2020   Chest pain    due to injury   Diabetes mellitus without complication ([place])    Dilated aortic root ([place]) 05/27/2015   48mm by Chest CTA 2021   Family history of early CAD 05/17/2016   Hyperlipidemia    Mitral valve prolapse    with mild MR   Umbilical hernia     Past Surgical  History:  Procedure Laterality Date   BRONCHIAL BIOPSY  09/06/2020   part of lobe removed (upper) Procedure: BRONCHIAL BIOPSIES;  Surgeon: Garner Nash, DO;  Location: [facility];  Service: Pulmonary;;   BRONCHIAL BRUSHINGS  09/06/2020   Procedure: BRONCHIAL BRUSHINGS;  Surgeon: Garner Nash, DO;  Location: [facility];  Service: Pulmonary;;   BRONCHIAL NEEDLE ASPIRATION BIOPSY  09/06/2020   Procedure: BRONCHIAL NEEDLE ASPIRATION BIOPSIES;   Surgeon: Garner Nash, DO;  Location: [facility];  Service: Pulmonary;;   COLONOSCOPY     HERNIA REPAIR     INTERCOSTAL NERVE BLOCK Left 09/06/2020   Procedure: INTERCOSTAL NERVE BLOCK;  Surgeon: Lajuana Matte, MD;  Location: [facility];  Service: Thoracic;  Laterality: Left;   NECK SURGERY     PLANTAR FASCIA RELEASE     SCLEROTHERAPY  09/06/2020   Procedure: SCLEROTHERAPY;  Surgeon: Garner Nash, DO;  Location: [facility] [place];  Service: Pulmonary;;   SEPTOPLASTY  4431   UMBILICAL HERNIA REPAIR  07/09/2012   Procedure: HERNIA REPAIR UMBILICAL ADULT;  Surgeon: Haywood Lasso, MD;  Location: [facility];  Service: General;  Laterality: N/A;  repair umbilical hernia   VIDEO BRONCHOSCOPY WITH ENDOBRONCHIAL NAVIGATION Left 09/06/2020   Procedure: VIDEO BRONCHOSCOPY WITH ENDOBRONCHIAL NAVIGATION;  Surgeon: Garner Nash, DO;  Location: [facility];  Service: Pulmonary;  Laterality: Left;  biopsies and fiducial dye marking, MB + ICG    Family History  Problem Relation Age of Onset   CAD Father    Diabetes Father    Hyperlipidemia Sister    Diabetes Sister    Diabetes Maternal Grandmother    Diabetes Maternal Grandfather    Diabetes Paternal Grandmother    Diabetes Paternal Grandfather    Colon cancer Neg Hx    Pancreatic cancer Neg Hx    Stomach cancer Neg Hx    Esophageal cancer Neg Hx    Rectal cancer Neg Hx     Social History Social History   Tobacco Use   Smoking status: Former    Types: Cigarettes    Quit date: 05/20/1997    Years since quitting: 24.0   Smokeless tobacco: Never  Vaping Use   Vaping Use: Never used  Substance Use Topics   Alcohol use: Yes    Alcohol/week: 3.0 standard drinks    Types: 3 Cans of beer per week    Comment: occasional   Drug use: No    No Known Allergies  Current Outpatient Medications  Medication Sig Dispense Refill   aspirin EC 81 MG tablet Take 81 mg by mouth daily.     atorvastatin (LIPITOR) 80 MG tablet  Take 1 tablet (80 mg total) by mouth daily.     FARXIGA 10 MG TABS tablet Take 10 mg by mouth daily.  7   meloxicam (MOBIC) 15 MG tablet Take 15 mg by mouth daily as needed.     metFORMIN (GLUCOPHAGE) 1000 MG tablet Take 1,000 mg by mouth daily with breakfast.  5   TRULICITY 1.5 VQ/0.0QQ SOPN Inject 1.5 mg into the skin once a week.     No current facility-administered medications for this visit.    Review of Systems  Constitutional: negative Eyes: negative Ears, nose, mouth, throat, and face: negative Respiratory: negative Cardiovascular: negative Gastrointestinal: negative Genitourinary:negative Integument/breast: negative Hematologic/lymphatic: negative Musculoskeletal:negative Neurological: negative Behavioral/Psych: negative Endocrine: negative Allergic/Immunologic: negative  Physical Exam  PYP:PJKDT, healthy, no distress, well nourished, and well developed SKIN: skin color, texture, turgor are  normal, no rashes or significant lesions HEAD: Normocephalic, No masses, lesions, tenderness or abnormalities EYES: normal, PERRLA, Conjunctiva are pink and non-injected EARS: External ears normal, Canals clear OROPHARYNX:no exudate, no erythema, and lips, buccal mucosa, and tongue normal  NECK: supple, no adenopathy, no JVD LYMPH:  no palpable lymphadenopathy, no hepatosplenomegaly LUNGS: clear to auscultation , and palpation HEART: regular rate & rhythm and no murmurs ABDOMEN:abdomen soft, non-tender, normal bowel sounds, and no masses or organomegaly BACK: Back symmetric, no curvature., No CVA tenderness EXTREMITIES:no joint deformities, effusion, or inflammation, no edema  NEURO: alert & oriented x 3 with fluent speech, no focal motor/sensory deficits  PERFORMANCE STATUS: ECOG 0  LABORATORY DATA: Lab Results  Component Value Date   WBC 8.0 05/14/2021   HGB 17.0 05/14/2021   HCT 46.7 05/14/2021   MCV 80.5 05/14/2021   PLT 237 05/14/2021      Chemistry       Component Value Date/Time   NA 137 05/14/2021 1401   NA 139 11/20/2020 0000   K 4.1 05/14/2021 1401   CL 105 05/14/2021 1401   CO2 23 05/14/2021 1401   BUN 17 05/14/2021 1401   BUN 15 11/20/2020 0000   CREATININE 1.06 05/14/2021 1401   CREATININE 1.08 06/05/2015 1120   GLU 188 11/20/2020 0000      Component Value Date/Time   CALCIUM 8.9 05/14/2021 1401   ALKPHOS 121 05/14/2021 1401   AST 19 05/14/2021 1401   ALT 40 05/14/2021 1401   BILITOT 1.5 (H) 05/14/2021 1401       RADIOGRAPHIC STUDIES: No results found.  ASSESSMENT: This is a very pleasant 60 years old white male recently diagnosed with a stage Ia (T1b, N0, M0) non-small cell lung cancer, adenocarcinoma, acinar predominant status post left upper lobe wedge resection under the care of Dr. Kipp Brood on September 06, 2020.   PLAN: I had a lengthy discussion with the patient today about his current condition as well as prognosis and treatment options. I explained to the patient that he had curable surgical resection.  I also explained to the patient that there is no benefit for adjuvant systemic chemotherapy or radiation for patient with resected a stage Ia non-small cell lung cancer and the current standard of care is observation and close monitoring. I recommended for the patient to have repeat CT scan of the chest in 6 months for restaging of his disease. After the first 2 years we will start seeing him on an annual basis with imaging studies if he has no concerning findings for recurrence. I encouraged the patient to continue with his physical exercise and routine follow-up visit by his primary care physician for his other medical conditions. The patient was advised to call immediately if he has any other concerning symptoms in the interval. The patient voices understanding of current disease status and treatment options and is in agreement with the current care plan.  All questions were answered. The patient knows to call the  clinic with any problems, questions or concerns. We can certainly see the patient much sooner if necessary.  Thank you so much for allowing me to participate in the care of Preston Gamble. I will continue to follow up the patient with you and assist in his care.  The total time spent in the appointment was 60 minutes.  Disclaimer: This note was dictated with voice recognition software. Similar sounding words can inadvertently be transcribed and may not be corrected upon review.   Eilleen Kempf  May 14, 2021, 2:40 PM

## 2021-05-14 NOTE — Progress Notes (Signed)
Oncology Nurse Navigator Documentation  Oncology Nurse Navigator Flowsheets 05/14/2021  Abnormal Finding Date 06/20/2020  Confirmed Diagnosis Date 09/06/2020  Diagnosis Status Confirmed Diagnosis Complete  Planned Course of Treatment Observation  Phase of Treatment No Treatment/ Observation  No Treatment Reason: Other  Surgery Actual Start Date: 09/06/2020  Navigation Complete Date: 05/14/2021  Post Navigation: Continue to Follow Patient? No  Reason Not Navigating Patient: No Treatment, Observation Only  Navigator Location [facility]  Navigator Encounter Type Clinic/MDC;Initial MedOnc  Treatment Initiated Date 09/06/2020  Patient Visit Type Initial;MedOnc/I spoke to Mr. Preston Gamble today.  He is new patient of Dr. Julien Nordmann hx NSCLC s/p wedge resection in March 2022.  Treatment plan is observation with follow up scan and appt in 6 months.  Patient verbalized understanding of plan.    Treatment Phase Other  Barriers/Navigation Needs Education  Education Other  Interventions Education;Psycho-Social Support  Acuity Level 2-Minimal Needs (1-2 Barriers Identified)  Education Method Verbal  Time Spent with Patient 15

## 2021-05-16 DIAGNOSIS — M6281 Muscle weakness (generalized): Secondary | ICD-10-CM | POA: Diagnosis not present

## 2021-05-16 DIAGNOSIS — M25511 Pain in right shoulder: Secondary | ICD-10-CM | POA: Diagnosis not present

## 2021-05-16 DIAGNOSIS — M25611 Stiffness of right shoulder, not elsewhere classified: Secondary | ICD-10-CM | POA: Diagnosis not present

## 2021-05-16 DIAGNOSIS — M7501 Adhesive capsulitis of right shoulder: Secondary | ICD-10-CM | POA: Diagnosis not present

## 2021-05-21 DIAGNOSIS — M7501 Adhesive capsulitis of right shoulder: Secondary | ICD-10-CM | POA: Diagnosis not present

## 2021-05-21 DIAGNOSIS — M6281 Muscle weakness (generalized): Secondary | ICD-10-CM | POA: Diagnosis not present

## 2021-05-21 DIAGNOSIS — M25511 Pain in right shoulder: Secondary | ICD-10-CM | POA: Diagnosis not present

## 2021-05-21 DIAGNOSIS — M25611 Stiffness of right shoulder, not elsewhere classified: Secondary | ICD-10-CM | POA: Diagnosis not present

## 2021-05-23 DIAGNOSIS — M25511 Pain in right shoulder: Secondary | ICD-10-CM | POA: Diagnosis not present

## 2021-05-23 DIAGNOSIS — M6281 Muscle weakness (generalized): Secondary | ICD-10-CM | POA: Diagnosis not present

## 2021-05-23 DIAGNOSIS — M7501 Adhesive capsulitis of right shoulder: Secondary | ICD-10-CM | POA: Diagnosis not present

## 2021-05-23 DIAGNOSIS — M25611 Stiffness of right shoulder, not elsewhere classified: Secondary | ICD-10-CM | POA: Diagnosis not present

## 2021-05-28 ENCOUNTER — Encounter: Payer: BC Managed Care – PPO | Admitting: Orthopedic Surgery

## 2021-05-29 DIAGNOSIS — M6281 Muscle weakness (generalized): Secondary | ICD-10-CM | POA: Diagnosis not present

## 2021-05-29 DIAGNOSIS — M25611 Stiffness of right shoulder, not elsewhere classified: Secondary | ICD-10-CM | POA: Diagnosis not present

## 2021-05-29 DIAGNOSIS — M25511 Pain in right shoulder: Secondary | ICD-10-CM | POA: Diagnosis not present

## 2021-05-29 DIAGNOSIS — M7501 Adhesive capsulitis of right shoulder: Secondary | ICD-10-CM | POA: Diagnosis not present

## 2021-06-04 DIAGNOSIS — M25611 Stiffness of right shoulder, not elsewhere classified: Secondary | ICD-10-CM | POA: Diagnosis not present

## 2021-06-04 DIAGNOSIS — M7501 Adhesive capsulitis of right shoulder: Secondary | ICD-10-CM | POA: Diagnosis not present

## 2021-06-04 DIAGNOSIS — M25511 Pain in right shoulder: Secondary | ICD-10-CM | POA: Diagnosis not present

## 2021-06-04 DIAGNOSIS — M6281 Muscle weakness (generalized): Secondary | ICD-10-CM | POA: Diagnosis not present

## 2021-06-27 ENCOUNTER — Other Ambulatory Visit: Payer: Self-pay

## 2021-06-27 ENCOUNTER — Ambulatory Visit (HOSPITAL_COMMUNITY): Payer: BC Managed Care – PPO | Attending: Cardiology

## 2021-06-27 DIAGNOSIS — I712 Thoracic aortic aneurysm, without rupture, unspecified: Secondary | ICD-10-CM | POA: Diagnosis not present

## 2021-06-27 DIAGNOSIS — I77810 Thoracic aortic ectasia: Secondary | ICD-10-CM | POA: Insufficient documentation

## 2021-06-27 LAB — ECHOCARDIOGRAM LIMITED: S' Lateral: 3.1 cm

## 2021-06-29 DIAGNOSIS — H2513 Age-related nuclear cataract, bilateral: Secondary | ICD-10-CM | POA: Diagnosis not present

## 2021-06-29 DIAGNOSIS — H5203 Hypermetropia, bilateral: Secondary | ICD-10-CM | POA: Diagnosis not present

## 2021-06-29 DIAGNOSIS — E119 Type 2 diabetes mellitus without complications: Secondary | ICD-10-CM | POA: Diagnosis not present

## 2021-06-29 LAB — HM DIABETES EYE EXAM

## 2021-07-03 ENCOUNTER — Encounter: Payer: Self-pay | Admitting: Family Medicine

## 2021-07-24 ENCOUNTER — Other Ambulatory Visit: Payer: Self-pay | Admitting: Cardiology

## 2021-08-30 ENCOUNTER — Encounter: Payer: BC Managed Care – PPO | Admitting: Family Medicine

## 2021-09-28 ENCOUNTER — Other Ambulatory Visit: Payer: Self-pay

## 2021-09-28 ENCOUNTER — Ambulatory Visit (INDEPENDENT_AMBULATORY_CARE_PROVIDER_SITE_OTHER): Payer: BC Managed Care – PPO | Admitting: Cardiology

## 2021-09-28 ENCOUNTER — Encounter: Payer: Self-pay | Admitting: Cardiology

## 2021-09-28 VITALS — BP 124/72 | HR 72 | Ht 74.0 in | Wt 244.8 lb

## 2021-09-28 DIAGNOSIS — R931 Abnormal findings on diagnostic imaging of heart and coronary circulation: Secondary | ICD-10-CM

## 2021-09-28 DIAGNOSIS — I77810 Thoracic aortic ectasia: Secondary | ICD-10-CM | POA: Diagnosis not present

## 2021-09-28 DIAGNOSIS — E7800 Pure hypercholesterolemia, unspecified: Secondary | ICD-10-CM

## 2021-09-28 DIAGNOSIS — I341 Nonrheumatic mitral (valve) prolapse: Secondary | ICD-10-CM | POA: Diagnosis not present

## 2021-09-28 NOTE — Addendum Note (Signed)
Addended by: Fransico Him R on: 09/28/2021 02:11 PM ? ? Modules accepted: Orders ? ?

## 2021-09-28 NOTE — Addendum Note (Signed)
Addended by: [name] on: 09/28/2021 01:39 PM ? ? Modules accepted: Orders ? ?

## 2021-09-28 NOTE — Patient Instructions (Addendum)
Medication Instructions:  ?Your physician recommends that you continue on your current medications as directed. Please refer to the Current Medication list given to you today. ? ?*If you need a refill on your cardiac medications before your next appointment, please call your pharmacy* ? ? ?Lab Work: ?Fasting lipids and ALT on same day as stress test.  ?If you have labs (blood work) drawn today and your tests are completely normal, you will receive your results only by: ?MyChart Message (if you have MyChart) OR ?A paper copy in the mail ?If you have any lab test that is abnormal or we need to change your treatment, we will call you to review the results. ? ? ?Testing/Procedures: ?Your physician has requested that you have an exercise tolerance test. For further information please visit [email or web address]. Please also follow instruction sheet, as given. ? ?Your physician has requested that you have an echocardiogram in December 2023. Echocardiography is a painless test that uses sound waves to create images of your heart. It provides your doctor with information about the size and shape of your heart and how well your heart?s chambers and valves are working. This procedure takes approximately one hour. There are no restrictions for this procedure. ? ? ?Follow-Up: ?At [facility], you and your health needs are our priority.  As part of our continuing mission to provide you with exceptional heart care, we have created designated Provider Care Teams.  These Care Teams include your primary Cardiologist (physician) and Advanced Practice Providers (APPs -  Physician Assistants and Nurse Practitioners) who all work together to provide you with the care you need, when you need it.  ? ?Your next appointment:   ?1 year(s) ? ?The format for your next appointment:   ?In Person ? ?Provider:   ?Fransico Him, MD   ? ? ?  ?

## 2021-09-28 NOTE — Progress Notes (Signed)
?Date:  06/09/2019  ? ?ID:  Preston Gamble, DOB 1960/09/01, MRN [id number] ? ? ?Cardiology Office Note:   ? ?Date:  09/28/2021  ? ?ID:  Preston Gamble, DOB [birth year], MRN [id number] ? ?PCP:  Vivi Barrack, MD  ?Cardiologist:  Fransico Him, MD   ? ?Referring MD: Vivi Barrack, MD  ? ?Chief Complaint  ?Patient presents with  ? Follow-up  ?  Coronary artery calcium, hyperlipidemia, mitral prolapse  ? ? ?History of Present Illness:   ? ?Preston Gamble is a 61 y.o. male with a hx of mitral valve prolapse and mild MR as well as dilated aortic root. He also had coronary artery ca+ with a score of 101 which is 77th% for his age and sex.  Since I saw him last he was dx with LUL adeno CA and had a lobectomy.  ? ?He is here today for followup and is doing well.  Since his lobectomy he has DOE when walking long distances.  He denies any chest pain or pressure, PND, orthopnea, LE edema, dizziness, palpitations or syncope. He is compliant with his meds and is tolerating meds with no SE.    ? ?Past Medical History:  ?Diagnosis Date  ? Cancer [facility])   ? lung cancer- dx 06-2020  ? Chest pain   ? due to injury  ? Diabetes mellitus without complication ([place])   ? Dilated aortic root ([place]) 05/27/2015  ? 7mm by Chest CTA 2021  ? Family history of early CAD 05/17/2016  ? Hyperlipidemia   ? Mitral valve prolapse   ? with mild MR  ? Umbilical hernia   ? ? ?Past Surgical History:  ?Procedure Laterality Date  ? BRONCHIAL BIOPSY  09/06/2020  ? part of lobe removed (upper) Procedure: BRONCHIAL BIOPSIES;  Surgeon: Garner Nash, DO;  Location: [place] [facility];  Service: Pulmonary;;  ? BRONCHIAL BRUSHINGS  09/06/2020  ? Procedure: BRONCHIAL BRUSHINGS;  Surgeon: Garner Nash, DO;  Location: [place];  Service: Pulmonary;;  ? BRONCHIAL NEEDLE ASPIRATION BIOPSY  09/06/2020  ? Procedure: BRONCHIAL NEEDLE ASPIRATION BIOPSIES;  Surgeon: Garner Nash, DO;  Location: [place] [facility];  Service: Pulmonary;;  ? COLONOSCOPY    ? HERNIA  REPAIR    ? INTERCOSTAL NERVE BLOCK Left 09/06/2020  ? Procedure: INTERCOSTAL NERVE BLOCK;  Surgeon: Lajuana Matte, MD;  Location: [place];  Service: Thoracic;  Laterality: Left;  ? NECK SURGERY    ? PLANTAR FASCIA RELEASE    ? SCLEROTHERAPY  09/06/2020  ? Procedure: SCLEROTHERAPY;  Surgeon: Garner Nash, DO;  Location: [place] [facility];  Service: Pulmonary;;  ? SEPTOPLASTY  1994  ? UMBILICAL HERNIA REPAIR  07/09/2012  ? Procedure: HERNIA REPAIR UMBILICAL ADULT;  Surgeon: Haywood Lasso, MD;  Location: [place];  Service: General;  Laterality: N/A;  repair umbilical hernia  ? VIDEO BRONCHOSCOPY WITH ENDOBRONCHIAL NAVIGATION Left 09/06/2020  ? Procedure: VIDEO BRONCHOSCOPY WITH ENDOBRONCHIAL NAVIGATION;  Surgeon: Garner Nash, DO;  Location: [place];  Service: Pulmonary;  Laterality: Left;  biopsies and fiducial dye marking, MB + ICG  ? ? ?Current Medications: ?Current Meds  ?Medication Sig  ? aspirin EC 81 MG tablet Take 81 mg by mouth daily.  ? atorvastatin (LIPITOR) 80 MG tablet TAKE 1 TABLET(80 MG) BY MOUTH DAILY  ? FARXIGA 10 MG TABS tablet Take 10 mg by mouth daily.  ? meloxicam (MOBIC) 15 MG tablet Take 15 mg by mouth daily as needed.  ? metFORMIN (GLUCOPHAGE)  1000 MG tablet Take 1,000 mg by mouth daily with breakfast.  ? TRULICITY 1.5 [place]/7.0WC SOPN Inject 1.5 mg into the skin once a week.  ?  ? ?Allergies:   Patient has no known allergies.  ? ?Social History  ? ?Socioeconomic History  ? Marital status: Married  ?  Spouse name: Not on file  ? Number of children: Not on file  ? Years of education: Not on file  ? Highest education level: Not on file  ?Occupational History  ? Not on file  ?Tobacco Use  ? Smoking status: Former  ?  Types: Cigarettes  ?  Quit date: 05/20/1997  ?  Years since quitting: 24.3  ? Smokeless tobacco: Never  ?Vaping Use  ? Vaping Use: Never used  ?Substance and Sexual Activity  ? Alcohol use: Yes  ?  Alcohol/week: 3.0 standard drinks  ?  Types: 3 Cans of beer  per week  ?  Comment: occasional  ? Drug use: No  ? Sexual activity: Not on file  ?Other Topics Concern  ? Not on file  ?Social History Narrative  ? Not on file  ? ?Social Determinants of Health  ? ?Financial Resource Strain: Not on file  ?Food Insecurity: Not on file  ?Transportation Needs: Not on file  ?Physical Activity: Not on file  ?Stress: Not on file  ?Social Connections: Not on file  ?  ? ?Family History: ?The patient's family history includes CAD in his father; Diabetes in his father, maternal grandfather, maternal grandmother, paternal grandfather, paternal grandmother, and sister; Hyperlipidemia in his sister. There is no history of Colon cancer, Pancreatic cancer, Stomach cancer, Esophageal cancer, or Rectal cancer. ? ?ROS:   ?Please see the history of present illness.    ?ROS  ?All other systems reviewed and negative.  ? ?EKGs/Labs/Other Studies Reviewed:   ? ?The following studies were reviewed today: ?none ? ?EKG:  EKG is  ordered today.  The ekg ordered today demonstrates NSR with nonspecific T wave abnormality ? ?Recent Labs: ?05/14/2021: ALT 40; BUN 17; Creatinine 1.06; Hemoglobin 17.0; Platelet Count 237; Potassium 4.1; Sodium 137  ? ?Recent Lipid Panel ?   ?Component Value Date/Time  ? CHOL 138 11/20/2020 0000  ? CHOL 127 09/28/2019 0743  ? TRIG 144 11/20/2020 0000  ? HDL 41 11/20/2020 0000  ? HDL 39 (L) 09/28/2019 0743  ? CHOLHDL 3.3 09/28/2019 0743  ? [place] 72 11/20/2020 0000  ? [place] 67 09/28/2019 0743  ? ? ?Physical Exam:   ? ?VS:  BP 124/72   Pulse 72   Ht 6\' 2"  (1.88 m)   Wt 244 lb 12.8 oz (111 kg)   SpO2 97%   BMI 31.43 kg/m?    ? ?GEN: Well nourished, well developed in no acute distress ?HEENT: Normal ?NECK: No JVD; No carotid bruits ?LYMPHATICS: No lymphadenopathy ?CARDIAC:RRR, no murmurs, rubs, gallops ?RESPIRATORY:  Clear to auscultation without rales, wheezing or rhonchi  ?ABDOMEN: Soft, non-tender, non-distended ?MUSCULOSKELETAL:  No edema; No deformity  ?SKIN: Warm and  dry ?NEUROLOGIC:  Alert and oriented x 3 ?PSYCHIATRIC:  Normal affect   ?Wt Readings from Last 3 Encounters:  ?09/28/21 244 lb 12.8 oz (111 kg)  ?05/14/21 243 lb 14.4 oz (110.6 kg)  ?04/12/21 244 lb 3.2 oz (110.8 kg)  ?  ? ? ?ASSESSMENT:   ? ?1. MVP (mitral valve prolapse)   ?2. Dilated aortic root ([place])   ?3. Pure hypercholesterolemia   ?4. [place] coronary artery calcium score between 100 and 199   ? ?  PLAN:   ? ?In order of problems listed above: ? ?MVP   ?-not noted on last echo 06/2021 ?  ?Dilated aortic root ?-CT 2/2076mildly dilated at 44mm ?-His BP is well controlled on exam today ?-2D echo 06/2021 showed 41 mm and 40 mm ascending aorta ?-Repeat chest CT in 1 year ?  ?HLD ?-LDL goal < 70 due to DM and mildly dilated aorta ?-Check FLP and ALT ?-Continue prescription drug management with atorvastatin 80 mg daily with as needed refills ? ?Coronary artery calcium ?-Ca score 101 (77th % for his age) ?-He has not had any anginal symptoms ?-Exercise treadmill test 06/26/2020 showed no ischemia ? ? ?Medication Adjustments/Labs and Tests Ordered: ?Current medicines are reviewed at length with the patient today.  Concerns regarding medicines are outlined above.  ?Tests Ordered: ?Orders Placed This Encounter  ?Procedures  ? EKG 12-Lead  ? ?Medication Changes: ?No orders of the defined types were placed in this encounter. ? ? ?Disposition:  Follow up in 1 year(s) ? ?Signed, ?Fransico Him, MD  ?06/09/2019 9:06 AM    ? ?Medication Adjustments/Labs and Tests Ordered: ?Current medicines are reviewed at length with the patient today.  Concerns regarding medicines are outlined above.  ?Orders Placed This Encounter  ?Procedures  ? EKG 12-Lead  ? ?No orders of the defined types were placed in this encounter. ? ? ?Signed, ?Fransico Him, MD  ?09/28/2021 8:28 AM    ?[place] ?

## 2021-09-28 NOTE — Addendum Note (Signed)
Addended by: [name] on: 09/28/2021 08:41 AM ? ? Modules accepted: Orders ? ?

## 2021-10-29 ENCOUNTER — Other Ambulatory Visit: Payer: Self-pay | Admitting: Cardiology

## 2021-11-08 ENCOUNTER — Ambulatory Visit (HOSPITAL_COMMUNITY)
Admission: RE | Admit: 2021-11-08 | Discharge: 2021-11-08 | Disposition: A | Discharge status: Home / Self Care | Payer: BC Managed Care – PPO | Source: Ambulatory Visit | Attending: Internal Medicine | Admitting: Internal Medicine

## 2021-11-08 ENCOUNTER — Inpatient Hospital Stay: Payer: BC Managed Care – PPO | Attending: Internal Medicine

## 2021-11-08 ENCOUNTER — Other Ambulatory Visit: Payer: Self-pay

## 2021-11-08 DIAGNOSIS — C3490 Malignant neoplasm of unspecified part of unspecified bronchus or lung: Secondary | ICD-10-CM

## 2021-11-08 DIAGNOSIS — Z7982 Long term (current) use of aspirin: Secondary | ICD-10-CM | POA: Insufficient documentation

## 2021-11-08 DIAGNOSIS — E119 Type 2 diabetes mellitus without complications: Secondary | ICD-10-CM | POA: Insufficient documentation

## 2021-11-08 DIAGNOSIS — Z7984 Long term (current) use of oral hypoglycemic drugs: Secondary | ICD-10-CM | POA: Insufficient documentation

## 2021-11-08 DIAGNOSIS — E785 Hyperlipidemia, unspecified: Secondary | ICD-10-CM | POA: Insufficient documentation

## 2021-11-08 DIAGNOSIS — Z79899 Other long term (current) drug therapy: Secondary | ICD-10-CM | POA: Insufficient documentation

## 2021-11-08 DIAGNOSIS — Z85118 Personal history of other malignant neoplasm of bronchus and lung: Secondary | ICD-10-CM | POA: Insufficient documentation

## 2021-11-08 DIAGNOSIS — Z902 Acquired absence of lung [part of]: Secondary | ICD-10-CM | POA: Insufficient documentation

## 2021-11-08 LAB — CBC WITH DIFFERENTIAL (CANCER CENTER ONLY)
Abs Immature Granulocytes: 0.03 10*3/uL (ref 0.00–0.07)
Basophils Absolute: 0.1 10*3/uL (ref 0.0–0.1)
Basophils Relative: 1 %
Eosinophils Absolute: 0.3 10*3/uL (ref 0.0–0.5)
Eosinophils Relative: 4 %
HCT: 49.5 % (ref 39.0–52.0)
Hemoglobin: 17 g/dL (ref 13.0–17.0)
Immature Granulocytes: 0 %
Lymphocytes Relative: 19 %
Lymphs Abs: 1.5 10*3/uL (ref 0.7–4.0)
MCH: 28.2 pg (ref 26.0–34.0)
MCHC: 34.3 g/dL (ref 30.0–36.0)
MCV: 82.2 fL (ref 80.0–100.0)
Monocytes Absolute: 0.6 10*3/uL (ref 0.1–1.0)
Monocytes Relative: 8 %
Neutro Abs: 5.1 10*3/uL (ref 1.7–7.7)
Neutrophils Relative %: 68 %
Platelet Count: 224 10*3/uL (ref 150–400)
RBC: 6.02 MIL/uL — ABNORMAL HIGH (ref 4.22–5.81)
RDW: 12.8 % (ref 11.5–15.5)
WBC Count: 7.7 10*3/uL (ref 4.0–10.5)
nRBC: 0 % (ref 0.0–0.2)

## 2021-11-08 LAB — CMP (CANCER CENTER ONLY)
ALT: 37 U/L (ref 0–44)
AST: 22 U/L (ref 15–41)
Albumin: 4.4 g/dL (ref 3.5–5.0)
Alkaline Phosphatase: 85 U/L (ref 38–126)
Anion gap: 6 (ref 5–15)
BUN: 17 mg/dL (ref 6–20)
CO2: 27 mmol/L (ref 22–32)
Calcium: 9.5 mg/dL (ref 8.9–10.3)
Chloride: 105 mmol/L (ref 98–111)
Creatinine: 1.05 mg/dL (ref 0.61–1.24)
GFR, Estimated: >60 mL/min (ref >60)
Glucose, Bld: 136 mg/dL — ABNORMAL HIGH (ref 70–99)
Potassium: 4.4 mmol/L (ref 3.5–5.1)
Sodium: 138 mmol/L (ref 135–145)
Total Bilirubin: 1.5 mg/dL — ABNORMAL HIGH (ref 0.3–1.2)
Total Protein: 6.8 g/dL (ref 6.5–8.1)

## 2021-11-08 MED ORDER — IOHEXOL 300 MG/ML  SOLN
75.0000 mL | Freq: Once | INTRAMUSCULAR | Status: AC | PRN
  Administered 2021-11-08: 75 mL via INTRAVENOUS

## 2021-11-08 MED ORDER — SODIUM CHLORIDE (PF) 0.9 % IJ SOLN
INTRAMUSCULAR | Status: AC
  Filled 2021-11-08: qty 50

## 2021-11-12 ENCOUNTER — Other Ambulatory Visit: Payer: BC Managed Care – PPO

## 2021-11-12 ENCOUNTER — Inpatient Hospital Stay (HOSPITAL_BASED_OUTPATIENT_CLINIC_OR_DEPARTMENT_OTHER): Payer: BC Managed Care – PPO | Admitting: Internal Medicine

## 2021-11-12 ENCOUNTER — Other Ambulatory Visit: Payer: Self-pay

## 2021-11-12 ENCOUNTER — Encounter: Payer: Self-pay | Admitting: Internal Medicine

## 2021-11-12 VITALS — BP 140/77 | HR 62 | Temp 97.6°F | Resp 18 | Wt 247.1 lb

## 2021-11-12 DIAGNOSIS — Z7982 Long term (current) use of aspirin: Secondary | ICD-10-CM | POA: Diagnosis not present

## 2021-11-12 DIAGNOSIS — Z79899 Other long term (current) drug therapy: Secondary | ICD-10-CM | POA: Diagnosis not present

## 2021-11-12 DIAGNOSIS — C3412 Malignant neoplasm of upper lobe, left bronchus or lung: Secondary | ICD-10-CM

## 2021-11-12 DIAGNOSIS — C3490 Malignant neoplasm of unspecified part of unspecified bronchus or lung: Secondary | ICD-10-CM

## 2021-11-12 DIAGNOSIS — Z902 Acquired absence of lung [part of]: Secondary | ICD-10-CM | POA: Diagnosis not present

## 2021-11-12 DIAGNOSIS — E119 Type 2 diabetes mellitus without complications: Secondary | ICD-10-CM | POA: Diagnosis not present

## 2021-11-12 DIAGNOSIS — E785 Hyperlipidemia, unspecified: Secondary | ICD-10-CM | POA: Diagnosis not present

## 2021-11-12 DIAGNOSIS — Z85118 Personal history of other malignant neoplasm of bronchus and lung: Secondary | ICD-10-CM | POA: Diagnosis not present

## 2021-11-12 DIAGNOSIS — Z7984 Long term (current) use of oral hypoglycemic drugs: Secondary | ICD-10-CM | POA: Diagnosis not present

## 2021-11-12 NOTE — Progress Notes (Signed)
?    Preston Gamble ?[phone]   [phone] ? ?OFFICE PROGRESS NOTE ? ?Vivi Barrack, MD ?[facility] ?[place] [address] ? ?DIAGNOSIS: Stage IA (T1b, N0, M0) non-small cell lung cancer, adenocarcinoma, acinar predominant diagnosed in March 2022 ? ?PRIOR THERAPY: Status post left upper lobe wedge resection under the care of Dr. Kipp Brood on September 06, 2020. ? ?CURRENT THERAPY: Observation. ? ?INTERVAL HISTORY: ?Preston Gamble 61 y.o. male returns to the clinic today for 54-month follow-up visit.  The patient is feeling fine today with no concerning complaints.  He denied having any chest pain, shortness of breath, cough or hemoptysis.  He has no nausea, vomiting, diarrhea or constipation.  He has no headache or visual changes.  He denied having any recent weight loss or night sweats.  He had repeat CT scan of the chest performed recently and he is here for evaluation and discussion of his scan results. ? ?MEDICAL HISTORY: ?Past Medical History:  ?Diagnosis Date  ? Cancer [facility])   ? lung cancer- dx 06-2020  ? Chest pain   ? due to injury  ? Diabetes mellitus without complication ([place])   ? Dilated aortic root ([place]) 05/27/2015  ? 15mm by Chest CTA 2021  ? Family history of early CAD 05/17/2016  ? Hyperlipidemia   ? Mitral valve prolapse   ? with mild MR  ? Umbilical hernia   ? ? ?ALLERGIES:  has No Known Allergies. ? ?MEDICATIONS:  ?Current Outpatient Medications  ?Medication Sig Dispense Refill  ? atorvastatin (LIPITOR) 80 MG tablet TAKE 1 TABLET(80 MG) BY MOUTH DAILY 90 tablet 2  ? aspirin EC 81 MG tablet Take 81 mg by mouth daily.    ? FARXIGA 10 MG TABS tablet Take 10 mg by mouth daily.  7  ? meloxicam (MOBIC) 15 MG tablet Take 15 mg by mouth daily as needed.    ? metFORMIN (GLUCOPHAGE) 1000 MG tablet Take 1,000 mg by mouth daily with breakfast.  5  ? TRULICITY 1.5 EN/2.7PO SOPN Inject 1.5 mg into the skin once a week.    ? ?No current facility-administered medications for  this visit.  ? ? ?SURGICAL HISTORY:  ?Past Surgical History:  ?Procedure Laterality Date  ? BRONCHIAL BIOPSY  09/06/2020  ? part of lobe removed (upper) Procedure: BRONCHIAL BIOPSIES;  Surgeon: Garner Nash, DO;  Location: [facility];  Service: Pulmonary;;  ? BRONCHIAL BRUSHINGS  09/06/2020  ? Procedure: BRONCHIAL BRUSHINGS;  Surgeon: Garner Nash, DO;  Location: [facility];  Service: Pulmonary;;  ? BRONCHIAL NEEDLE ASPIRATION BIOPSY  09/06/2020  ? Procedure: BRONCHIAL NEEDLE ASPIRATION BIOPSIES;  Surgeon: Garner Nash, DO;  Location: [facility];  Service: Pulmonary;;  ? COLONOSCOPY    ? HERNIA REPAIR    ? INTERCOSTAL NERVE BLOCK Left 09/06/2020  ? Procedure: INTERCOSTAL NERVE BLOCK;  Surgeon: Lajuana Matte, MD;  Location: [facility];  Service: Thoracic;  Laterality: Left;  ? NECK SURGERY    ? PLANTAR FASCIA RELEASE    ? SCLEROTHERAPY  09/06/2020  ? Procedure: SCLEROTHERAPY;  Surgeon: Garner Nash, DO;  Location: [facility];  Service: Pulmonary;;  ? SEPTOPLASTY  1994  ? UMBILICAL HERNIA REPAIR  07/09/2012  ? Procedure: HERNIA REPAIR UMBILICAL ADULT;  Surgeon: Haywood Lasso, MD;  Location: [facility];  Service: General;  Laterality: N/A;  repair umbilical hernia  ? VIDEO BRONCHOSCOPY WITH ENDOBRONCHIAL NAVIGATION Left 09/06/2020  ? Procedure: VIDEO BRONCHOSCOPY WITH ENDOBRONCHIAL NAVIGATION;  Surgeon: Garner Nash, DO;  Location: [facility];  Service: Pulmonary;  Laterality: Left;  biopsies and fiducial dye marking, MB + ICG  ? ? ?REVIEW OF SYSTEMS:  A comprehensive review of systems was negative.  ? ?PHYSICAL EXAMINATION: General appearance: alert, cooperative, and no distress ?Head: Normocephalic, without obvious abnormality, atraumatic ?Neck: no adenopathy, no JVD, supple, symmetrical, trachea midline, and thyroid not enlarged, symmetric, no tenderness/mass/nodules ?Lymph nodes: Cervical, supraclavicular, and axillary nodes normal. ?Resp: clear to auscultation  bilaterally ?Back: symmetric, no curvature. ROM normal. No CVA tenderness. ?Cardio: regular rate and rhythm, S1, S2 normal, no murmur, click, rub or gallop ?GI: soft, non-tender; bowel sounds normal; no masses,  no organomegaly ?Extremities: extremities normal, atraumatic, no cyanosis or edema ? ?ECOG PERFORMANCE STATUS: 0 - Asymptomatic ? ?Blood pressure 140/77, pulse 62, temperature 97.6 ?F (36.4 ?C), temperature source Tympanic, resp. rate 18, weight 247 lb 2 oz (112.1 kg), SpO2 95 %. ? ?LABORATORY DATA: ?Lab Results  ?Component Value Date  ? WBC 7.7 11/08/2021  ? HGB 17.0 11/08/2021  ? HCT 49.5 11/08/2021  ? MCV 82.2 11/08/2021  ? PLT 224 11/08/2021  ? ? ?  Chemistry   ?   ?Component Value Date/Time  ? NA 138 11/08/2021 1423  ? NA 139 11/20/2020 0000  ? K 4.4 11/08/2021 1423  ? CL 105 11/08/2021 1423  ? CO2 27 11/08/2021 1423  ? BUN 17 11/08/2021 1423  ? BUN 15 11/20/2020 0000  ? CREATININE 1.05 11/08/2021 1423  ? CREATININE 1.08 06/05/2015 1120  ? GLU 188 11/20/2020 0000  ?    ?Component Value Date/Time  ? CALCIUM 9.5 11/08/2021 1423  ? ALKPHOS 85 11/08/2021 1423  ? AST 22 11/08/2021 1423  ? ALT 37 11/08/2021 1423  ? BILITOT 1.5 (H) 11/08/2021 1423  ?  ? ? ? ?RADIOGRAPHIC STUDIES: ?CT Chest W Contrast ? ?Result Date: 11/09/2021 ?CLINICAL DATA:  Non-small cell lung cancer staging; * Tracking Code: BO * EXAM: CT CHEST WITH CONTRAST TECHNIQUE: Multidetector CT imaging of the chest was performed during intravenous contrast administration. RADIATION DOSE REDUCTION: This exam was performed according to the departmental dose-optimization program which includes automated exposure control, adjustment of the mA and/or kV according to patient size and/or use of iterative reconstruction technique. CONTRAST:  10mL OMNIPAQUE IOHEXOL 300 MG/ML  SOLN COMPARISON:  Chest CT dated April 28, 2021 FINDINGS: Cardiovascular: Normal heart size. No pericardial effusion. Mild atherosclerotic disease of the thoracic aorta. No suspicious  filling defects of the main pulmonary arteries. Mediastinum/Nodes: Esophagus and thyroid are unremarkable. No enlarged lymph nodes seen in the chest. Lungs/Pleura: Prior left upper lobe wedge resection. No consolidation, pleural effusion or pneumothorax. No new or enlarging pulmonary nodules. Upper Abdomen: No acute abnormality. Musculoskeletal: No aggressive appearing osseous lesions. IMPRESSION: 1. Prior left upper lobe resection. No evidence of recurrent or metastatic disease. 2. Aortic Atherosclerosis (ICD10-I70.0). Electronically Signed   By: Yetta Glassman M.D.   On: 11/09/2021 20:04   ? ?ASSESSMENT AND PLAN: This is a very pleasant 61 years old white male with a stage IA (T1b, N0, M0) non-small cell lung cancer, adenocarcinoma, acinar predominant diagnosed in March 2022 status post left upper lobe wedge resection under the care of Dr. Kipp Brood on September 06, 2020. ?The patient has been on observation since that time and he is feeling fine with no concerning complaints. ?The patient had repeat CT scan of the chest performed recently.  I personally and independently reviewed the scans and discussed the results with  the patient today. ?His scan showed no concerning findings for disease recurrence or metastasis. ?I recommended for him to continue on observation with repeat CT scan of the chest in 6 months. ?The patient was advised to call immediately if he has any other concerning symptoms in the interval. ?The patient voices understanding of current disease status and treatment options and is in agreement with the current care plan. ? ?All questions were answered. The patient knows to call the clinic with any problems, questions or concerns. We can certainly see the patient much sooner if necessary. ?The total time spent in the appointment was 20 minutes. ? ?Disclaimer: This note was dictated with voice recognition software. Similar sounding words can inadvertently be transcribed and may not be corrected upon  review. ? ? ?  ?   ?

## 2021-11-19 DIAGNOSIS — E785 Hyperlipidemia, unspecified: Secondary | ICD-10-CM | POA: Diagnosis not present

## 2021-11-19 DIAGNOSIS — E559 Vitamin D deficiency, unspecified: Secondary | ICD-10-CM | POA: Diagnosis not present

## 2021-11-19 DIAGNOSIS — Z6832 Body mass index (BMI) 32.0-32.9, adult: Secondary | ICD-10-CM | POA: Diagnosis not present

## 2021-11-19 DIAGNOSIS — E1165 Type 2 diabetes mellitus with hyperglycemia: Secondary | ICD-10-CM | POA: Diagnosis not present

## 2021-11-20 ENCOUNTER — Other Ambulatory Visit: Payer: BC Managed Care – PPO | Admitting: *Deleted

## 2021-11-20 ENCOUNTER — Ambulatory Visit (INDEPENDENT_AMBULATORY_CARE_PROVIDER_SITE_OTHER): Payer: BC Managed Care – PPO

## 2021-11-20 DIAGNOSIS — R931 Abnormal findings on diagnostic imaging of heart and coronary circulation: Secondary | ICD-10-CM

## 2021-11-20 DIAGNOSIS — I341 Nonrheumatic mitral (valve) prolapse: Secondary | ICD-10-CM | POA: Diagnosis not present

## 2021-11-20 DIAGNOSIS — E7800 Pure hypercholesterolemia, unspecified: Secondary | ICD-10-CM | POA: Diagnosis not present

## 2021-11-20 DIAGNOSIS — I77810 Thoracic aortic ectasia: Secondary | ICD-10-CM

## 2021-11-20 LAB — EXERCISE TOLERANCE TEST
Angina Index: 0
Duke Treadmill Score: 12
Estimated workload: 13.7
Exercise duration (min): 12 min
Exercise duration (sec): 0 s
MPHR: 162 {beats}/min
Peak HR: 137 {beats}/min
Percent HR: 85 %
RPE: 16
Rest HR: 63 {beats}/min
ST Depression (mm): 0 mm

## 2021-11-20 LAB — LIPID PANEL
Chol/HDL Ratio: 2.9 ratio (ref 0.0–5.0)
Cholesterol, Total: 126 mg/dL (ref 100–199)
HDL: 43 mg/dL (ref >39)
LDL Chol Calc (NIH): 64 mg/dL (ref 0–99)
Triglycerides: 101 mg/dL (ref 0–149)
VLDL Cholesterol Cal: 19 mg/dL (ref 5–40)

## 2021-11-20 LAB — ALT: ALT: 35 IU/L (ref 0–44)

## 2022-01-25 DIAGNOSIS — H6123 Impacted cerumen, bilateral: Secondary | ICD-10-CM | POA: Diagnosis not present

## 2022-01-25 DIAGNOSIS — H9203 Otalgia, bilateral: Secondary | ICD-10-CM | POA: Diagnosis not present

## 2022-01-25 DIAGNOSIS — Z0130 Encounter for examination of blood pressure without abnormal findings: Secondary | ICD-10-CM | POA: Diagnosis not present

## 2022-01-25 DIAGNOSIS — Z6831 Body mass index (BMI) 31.0-31.9, adult: Secondary | ICD-10-CM | POA: Diagnosis not present

## 2022-01-25 DIAGNOSIS — H60502 Unspecified acute noninfective otitis externa, left ear: Secondary | ICD-10-CM | POA: Diagnosis not present

## 2022-01-30 DIAGNOSIS — H6123 Impacted cerumen, bilateral: Secondary | ICD-10-CM | POA: Diagnosis not present

## 2022-03-12 DIAGNOSIS — L814 Other melanin hyperpigmentation: Secondary | ICD-10-CM | POA: Diagnosis not present

## 2022-03-12 DIAGNOSIS — L821 Other seborrheic keratosis: Secondary | ICD-10-CM | POA: Diagnosis not present

## 2022-03-12 DIAGNOSIS — B360 Pityriasis versicolor: Secondary | ICD-10-CM | POA: Diagnosis not present

## 2022-03-12 DIAGNOSIS — D2371 Other benign neoplasm of skin of right lower limb, including hip: Secondary | ICD-10-CM | POA: Diagnosis not present

## 2022-03-12 DIAGNOSIS — L570 Actinic keratosis: Secondary | ICD-10-CM | POA: Diagnosis not present

## 2022-03-13 ENCOUNTER — Ambulatory Visit (INDEPENDENT_AMBULATORY_CARE_PROVIDER_SITE_OTHER): Payer: BC Managed Care – PPO | Admitting: Family Medicine

## 2022-03-13 ENCOUNTER — Encounter: Payer: Self-pay | Admitting: Family Medicine

## 2022-03-13 VITALS — BP 112/74 | HR 66 | Temp 97.3°F | Ht 74.0 in | Wt 241.8 lb

## 2022-03-13 DIAGNOSIS — E1169 Type 2 diabetes mellitus with other specified complication: Secondary | ICD-10-CM

## 2022-03-13 DIAGNOSIS — Z23 Encounter for immunization: Secondary | ICD-10-CM | POA: Diagnosis not present

## 2022-03-13 DIAGNOSIS — N529 Male erectile dysfunction, unspecified: Secondary | ICD-10-CM | POA: Insufficient documentation

## 2022-03-13 DIAGNOSIS — E785 Hyperlipidemia, unspecified: Secondary | ICD-10-CM | POA: Diagnosis not present

## 2022-03-13 DIAGNOSIS — E119 Type 2 diabetes mellitus without complications: Secondary | ICD-10-CM

## 2022-03-13 LAB — POCT GLYCOSYLATED HEMOGLOBIN (HGB A1C): Hemoglobin A1C: 7.9 % — AB (ref 4.0–5.6)

## 2022-03-13 MED ORDER — TADALAFIL 5 MG PO TABS: 5.0000 mg | ORAL_TABLET | Freq: Every day | ORAL | 11 refills | Status: DC

## 2022-03-13 MED ORDER — TRULICITY 1.5 MG/0.5ML ~~LOC~~ SOAJ: 1.5000 mg | SUBCUTANEOUS | 3 refills | Status: DC

## 2022-03-13 NOTE — Patient Instructions (Signed)
It was very nice to see you today!  Your A1c today is 7.9.  Please continue to work on diet and exercise.  I will see back in 3 months for your annual physical.  Come back sooner if needed.  Take care, Dr Jerline Pain  PLEASE NOTE:  If you had any lab tests please let us know if you have not heard back within a few days. You may see your results on mychart before we have a chance to review them but we will give you a call once they are reviewed by [place]. If we ordered any referrals today, please let us know if you have not heard from their office within the next week.   Please try these tips to maintain a healthy lifestyle:  Eat at least 3 REAL meals and 1-2 snacks per day.  Aim for no more than 5 hours between eating.  If you eat breakfast, please do so within one hour of getting up.   Each meal should contain half fruits/vegetables, one quarter protein, and one quarter carbs (no bigger than a computer mouse)  Cut down on sweet beverages. This includes juice, soda, and sweet tea.   Drink at least 1 glass of water with each meal and aim for at least 8 glasses per day  Exercise at least 150 minutes every week.

## 2022-03-13 NOTE — Assessment & Plan Note (Signed)
Will be take over his diabetes management.  His A1c today is 7.9.  Is currently on Trulicity 1.5 mg weekly, metformin 1000 mg daily, and Farxiga 10 mg daily.  We will continue his current regimen for now.  He will continue to work on diet and exercise.  He will come back in 3 months for CPE and we can recheck A1c at that time.

## 2022-03-13 NOTE — Progress Notes (Signed)
   Preston Gamble is a 61 y.o. male who presents today for an office visit.  Assessment/Plan:  Chronic Problems Addressed Today: Diabetes mellitus without complication ([place]) Will be take over his diabetes management.  His A1c today is 7.9.  Is currently on Trulicity 1.5 mg weekly, metformin 1000 mg daily, and Farxiga 10 mg daily.  We will continue his current regimen for now.  He will continue to work on diet and exercise.  He will come back in 3 months for CPE and we can recheck A1c at that time.  Erectile dysfunction Discussed treatment options.  We will try Cialis 5 mg daily as needed.  We discussed potential side effects.  Dyslipidemia associated with type 2 diabetes mellitus ([place]) He is on Lipitor 80 mg daily.  He will come back in 3 months for CPE and we can check lipids at that time.  Flu shot given today.     Subjective:  HPI:  See A/p for status of chronic conditions.         Objective:  Physical Exam: BP 112/74   Pulse 66   Temp (!) 97.3 F (36.3 C) (Temporal)   Ht 6\' 2"  (1.88 m)   Wt 241 lb 12.8 oz (109.7 kg)   SpO2 96%   BMI 31.05 kg/m   Gen: No acute distress, resting comfortably CV: Regular rate and rhythm with no murmurs appreciated Pulm: Normal work of breathing, clear to auscultation bilaterally with no crackles, wheezes, or rhonchi Neuro: Grossly normal, moves all extremities Psych: Normal affect and thought content      Aayra Hornbaker M. Jerline Pain, MD 03/13/2022 9:23 AM

## 2022-03-13 NOTE — Assessment & Plan Note (Signed)
Discussed treatment options.  We will try Cialis 5 mg daily as needed.  We discussed potential side effects.

## 2022-03-13 NOTE — Assessment & Plan Note (Signed)
He is on Lipitor 80 mg daily.  He will come back in 3 months for CPE and we can check lipids at that time.

## 2022-05-13 ENCOUNTER — Ambulatory Visit (HOSPITAL_COMMUNITY)
Admission: RE | Admit: 2022-05-13 | Discharge: 2022-05-13 | Disposition: A | Discharge status: Home / Self Care | Payer: BC Managed Care – PPO | Source: Ambulatory Visit | Attending: Internal Medicine | Admitting: Internal Medicine

## 2022-05-13 ENCOUNTER — Inpatient Hospital Stay: Payer: BC Managed Care – PPO | Attending: Internal Medicine

## 2022-05-13 ENCOUNTER — Other Ambulatory Visit: Payer: Self-pay

## 2022-05-13 DIAGNOSIS — Z7982 Long term (current) use of aspirin: Secondary | ICD-10-CM | POA: Insufficient documentation

## 2022-05-13 DIAGNOSIS — I700 Atherosclerosis of aorta: Secondary | ICD-10-CM | POA: Diagnosis not present

## 2022-05-13 DIAGNOSIS — Z902 Acquired absence of lung [part of]: Secondary | ICD-10-CM | POA: Insufficient documentation

## 2022-05-13 DIAGNOSIS — Z85118 Personal history of other malignant neoplasm of bronchus and lung: Secondary | ICD-10-CM | POA: Insufficient documentation

## 2022-05-13 DIAGNOSIS — Z79899 Other long term (current) drug therapy: Secondary | ICD-10-CM | POA: Insufficient documentation

## 2022-05-13 DIAGNOSIS — E119 Type 2 diabetes mellitus without complications: Secondary | ICD-10-CM | POA: Insufficient documentation

## 2022-05-13 DIAGNOSIS — R718 Other abnormality of red blood cells: Secondary | ICD-10-CM | POA: Insufficient documentation

## 2022-05-13 DIAGNOSIS — C3490 Malignant neoplasm of unspecified part of unspecified bronchus or lung: Secondary | ICD-10-CM

## 2022-05-13 DIAGNOSIS — Z87891 Personal history of nicotine dependence: Secondary | ICD-10-CM | POA: Insufficient documentation

## 2022-05-13 DIAGNOSIS — Z7984 Long term (current) use of oral hypoglycemic drugs: Secondary | ICD-10-CM | POA: Insufficient documentation

## 2022-05-13 LAB — CMP (CANCER CENTER ONLY)
ALT: 37 U/L (ref 0–44)
AST: 21 U/L (ref 15–41)
Albumin: 4.5 g/dL (ref 3.5–5.0)
Alkaline Phosphatase: 92 U/L (ref 38–126)
Anion gap: 8 (ref 5–15)
BUN: 18 mg/dL (ref 6–20)
CO2: 27 mmol/L (ref 22–32)
Calcium: 9.5 mg/dL (ref 8.9–10.3)
Chloride: 105 mmol/L (ref 98–111)
Creatinine: 1.13 mg/dL (ref 0.61–1.24)
GFR, Estimated: >60 mL/min (ref >60)
Glucose, Bld: 159 mg/dL — ABNORMAL HIGH (ref 70–99)
Potassium: 4.3 mmol/L (ref 3.5–5.1)
Sodium: 140 mmol/L (ref 135–145)
Total Bilirubin: 1.4 mg/dL — ABNORMAL HIGH (ref 0.3–1.2)
Total Protein: 6.9 g/dL (ref 6.5–8.1)

## 2022-05-13 LAB — CBC WITH DIFFERENTIAL (CANCER CENTER ONLY)
Abs Immature Granulocytes: 0.02 10*3/uL (ref 0.00–0.07)
Basophils Absolute: 0.1 10*3/uL (ref 0.0–0.1)
Basophils Relative: 1 %
Eosinophils Absolute: 0.4 10*3/uL (ref 0.0–0.5)
Eosinophils Relative: 6 %
HCT: 48.3 % (ref 39.0–52.0)
Hemoglobin: 17.3 g/dL — ABNORMAL HIGH (ref 13.0–17.0)
Immature Granulocytes: 0 %
Lymphocytes Relative: 21 %
Lymphs Abs: 1.5 10*3/uL (ref 0.7–4.0)
MCH: 29.4 pg (ref 26.0–34.0)
MCHC: 35.8 g/dL (ref 30.0–36.0)
MCV: 82 fL (ref 80.0–100.0)
Monocytes Absolute: 0.6 10*3/uL (ref 0.1–1.0)
Monocytes Relative: 8 %
Neutro Abs: 4.6 10*3/uL (ref 1.7–7.7)
Neutrophils Relative %: 64 %
Platelet Count: 241 10*3/uL (ref 150–400)
RBC: 5.89 MIL/uL — ABNORMAL HIGH (ref 4.22–5.81)
RDW: 12.8 % (ref 11.5–15.5)
WBC Count: 7.2 10*3/uL (ref 4.0–10.5)
nRBC: 0 % (ref 0.0–0.2)

## 2022-05-13 MED ORDER — IOHEXOL 300 MG/ML  SOLN
75.0000 mL | Freq: Once | INTRAMUSCULAR | Status: AC | PRN
  Administered 2022-05-13: 75 mL via INTRAVENOUS

## 2022-05-14 NOTE — Progress Notes (Unsigned)
[facility] OFFICE PROGRESS NOTE  Vivi Barrack, MD [address] [place] [address]  DIAGNOSIS: Stage IA (T1b, N0, M0) non-small cell lung cancer, adenocarcinoma, acinar predominant diagnosed in March 2022   PRIOR THERAPY:  Status post left upper lobe wedge resection under the care of Dr. Kipp Brood on September 06, 2020.   CURRENT THERAPY: Observation.   INTERVAL HISTORY: Preston Gamble 61 y.o. male returns to the clinic today for a follow-up visit.  The patient was last seen 6 months ago on 11/12/2021.  He is status post a left upper lobectomy March 2022.  Since last being seen he denies any changes in his health.  Denies any fever, chills, night sweats, or unexplained weight loss.  Denies any chest pain, shortness of breath, cough, or hemoptysis. He walks 3-4 miles per day and only gest some dyspnea if walking uphill. He quit smoking about 30 years ago. His Hbg is a little elevated. He denies hormone use, COPD, or iron supplements. He sometimes take vitamins and eats red meat. He takes a 81 mg aspirin. Denies any nausea, vomiting, diarrhea, or constipation.  Denies any headache or visual changes.  He recently had a restaging CT scan performed.  He is here today for evaluation to review his scan results.   MEDICAL HISTORY: Past Medical History:  Diagnosis Date   Cancer [facility])    lung cancer- dx 06-2020   Chest pain    due to injury   Diabetes mellitus without complication ([place])    Dilated aortic root ([place]) 05/27/2015   5mm by Chest CTA 2021   Family history of early CAD 05/17/2016   Hyperlipidemia    Mitral valve prolapse    with mild MR   Umbilical hernia     ALLERGIES:  has No Known Allergies.  MEDICATIONS:  Current Outpatient Medications  Medication Sig Dispense Refill   aspirin EC 81 MG tablet Take 81 mg by mouth daily.     atorvastatin (LIPITOR) 80 MG tablet TAKE 1 TABLET(80 MG) BY MOUTH DAILY 90 tablet 2   cholecalciferol (VITAMIN D3) 25 MCG (1000  UNIT) tablet Take 1,000 Units by mouth daily.     FARXIGA 10 MG TABS tablet Take 10 mg by mouth daily.  7   metFORMIN (GLUCOPHAGE) 1000 MG tablet Take 1,000 mg by mouth daily with breakfast.  5   tadalafil (CIALIS) 5 MG tablet Take 1 tablet (5 mg total) by mouth daily. 30 tablet 11   TRULICITY 1.5 MB/8.4YK SOPN Inject 1.5 mg into the skin once a week. 6 mL 3   No current facility-administered medications for this visit.    SURGICAL HISTORY:  Past Surgical History:  Procedure Laterality Date   BRONCHIAL BIOPSY  09/06/2020   part of lobe removed (upper) Procedure: BRONCHIAL BIOPSIES;  Surgeon: Garner Nash, DO;  Location: [facility];  Service: Pulmonary;;   BRONCHIAL BRUSHINGS  09/06/2020   Procedure: BRONCHIAL BRUSHINGS;  Surgeon: Garner Nash, DO;  Location: [facility];  Service: Pulmonary;;   BRONCHIAL NEEDLE ASPIRATION BIOPSY  09/06/2020   Procedure: BRONCHIAL NEEDLE ASPIRATION BIOPSIES;  Surgeon: Garner Nash, DO;  Location: [facility];  Service: Pulmonary;;   COLONOSCOPY     HERNIA REPAIR     INTERCOSTAL NERVE BLOCK Left 09/06/2020   Procedure: INTERCOSTAL NERVE BLOCK;  Surgeon: Lajuana Matte, MD;  Location: [facility];  Service: Thoracic;  Laterality: Left;   NECK SURGERY     PLANTAR FASCIA RELEASE  SCLEROTHERAPY  09/06/2020   Procedure: SCLEROTHERAPY;  Surgeon: Garner Nash, DO;  Location: [facility];  Service: Pulmonary;;   SEPTOPLASTY  [id number]   UMBILICAL HERNIA REPAIR  07/09/2012   Procedure: HERNIA REPAIR UMBILICAL ADULT;  Surgeon: Haywood Lasso, MD;  Location: [facility];  Service: General;  Laterality: N/A;  repair umbilical hernia   VIDEO BRONCHOSCOPY WITH ENDOBRONCHIAL NAVIGATION Left 09/06/2020   Procedure: VIDEO BRONCHOSCOPY WITH ENDOBRONCHIAL NAVIGATION;  Surgeon: Garner Nash, DO;  Location: [facility];  Service: Pulmonary;  Laterality: Left;  biopsies and fiducial dye marking, MB + ICG    REVIEW OF SYSTEMS:   Review of Systems   Constitutional: Negative for appetite change, chills, fatigue, fever and unexpected weight change.  HENT: Negative for mouth sores, nosebleeds, sore throat and trouble swallowing.   Eyes: Negative for eye problems and icterus.  Respiratory: Negative for cough, hemoptysis, shortness of breath and wheezing.   Cardiovascular: Negative for chest pain and leg swelling.  Gastrointestinal: Negative for abdominal pain, constipation, diarrhea, nausea and vomiting.  Genitourinary: Negative for bladder incontinence, difficulty urinating, dysuria, frequency and hematuria.   Musculoskeletal: Negative for back pain, gait problem, neck pain and neck stiffness.  Skin: Negative for itching and rash.  Neurological: Negative for dizziness, extremity weakness, gait problem, headaches, light-headedness and seizures.  Hematological: Negative for adenopathy. Does not bruise/bleed easily.  Psychiatric/Behavioral: Negative for confusion, depression and sleep disturbance. The patient is not nervous/anxious.     PHYSICAL EXAMINATION:  There were no vitals taken for this visit.  ECOG PERFORMANCE STATUS: 1  Physical Exam  Constitutional: Oriented to person, place, and time and well-developed, well-nourished, and in no distress.  HENT:  Head: Normocephalic and atraumatic.  Mouth/Throat: Oropharynx is clear and moist. No oropharyngeal exudate.  Eyes: Conjunctivae are normal. Right eye exhibits no discharge. Left eye exhibits no discharge. No scleral icterus.  Neck: Normal range of motion. Neck supple.  Cardiovascular: Normal rate, regular rhythm, normal heart sounds and intact distal pulses.   Pulmonary/Chest: Effort normal and breath sounds normal. No respiratory distress. No wheezes. No rales.  Abdominal: Soft. Bowel sounds are normal. Exhibits no distension and no mass. There is no tenderness.  Musculoskeletal: Normal range of motion. Exhibits no edema.  Lymphadenopathy:    No cervical adenopathy.   Neurological: Alert and oriented to person, place, and time. Exhibits normal muscle tone. Gait normal. Coordination normal.  Skin: Skin is warm and dry. No rash noted. Not diaphoretic. No erythema. No pallor.  Psychiatric: Mood, memory and judgment normal.  Vitals reviewed.  LABORATORY DATA: Lab Results  Component Value Date   WBC 7.2 05/13/2022   HGB 17.3 (H) 05/13/2022   HCT 48.3 05/13/2022   MCV 82.0 05/13/2022   PLT 241 05/13/2022      Chemistry      Component Value Date/Time   NA 140 05/13/2022 1557   NA 139 11/20/2020 0000   K 4.3 05/13/2022 1557   CL 105 05/13/2022 1557   CO2 27 05/13/2022 1557   BUN 18 05/13/2022 1557   BUN 15 11/20/2020 0000   CREATININE 1.13 05/13/2022 1557   CREATININE 1.08 06/05/2015 1120   GLU 188 11/20/2020 0000      Component Value Date/Time   CALCIUM 9.5 05/13/2022 1557   ALKPHOS 92 05/13/2022 1557   AST 21 05/13/2022 1557   ALT 37 05/13/2022 1557   BILITOT 1.4 (H) 05/13/2022 1557       RADIOGRAPHIC STUDIES:  CT Chest W Contrast  Result Date: 05/15/2022 CLINICAL DATA:  Non-small cell lung cancer, restaging. * Tracking Code: BO * EXAM: CT CHEST WITH CONTRAST TECHNIQUE: Multidetector CT imaging of the chest was performed during intravenous contrast administration. RADIATION DOSE REDUCTION: This exam was performed according to the departmental dose-optimization program which includes automated exposure control, adjustment of the mA and/or kV according to patient size and/or use of iterative reconstruction technique. CONTRAST:  53mL OMNIPAQUE IOHEXOL 300 MG/ML  SOLN COMPARISON:  11/08/2021 FINDINGS: Cardiovascular: Heart size appears within normal limits. No pericardial effusion. Aortic atherosclerosis and coronary artery calcification. Mediastinum/Nodes: No enlarged mediastinal, hilar, or axillary lymph nodes. Thyroid gland, trachea, and esophagus demonstrate no significant findings. Lungs/Pleura: Status post left upper lobe wedge resection.  No pleural effusion, airspace consolidation, atelectasis, or pneumothorax. No suspicious pulmonary nodule or mass identified. No signs of locally recurrent tumor. Upper Abdomen: No acute abnormality. Musculoskeletal: No chest wall abnormality. No acute or significant osseous findings. IMPRESSION: 1. Status post left upper lobe wedge resection. No signs of locally recurrent tumor or metastatic disease. 2. Coronary artery calcifications. 3.  Aortic Atherosclerosis (ICD10-I70.0). Electronically Signed   By: Kerby Moors M.D.   On: 05/15/2022 09:34     ASSESSMENT/PLAN:  This is a very pleasant 61 year old Caucasian male diagnosed with stage Ia (T1b, N0, M0) non-small cell lung cancer, adenocarcinoma, acinar predominant.  He was diagnosed in March 2022.  He is status post a left upper lobe wedge resection under the care of Dr. Kipp Brood on September 06, 2020.  The patient recently had a restaging CT scan performed.  Dr. Julien Nordmann personally and independently reviewed the scan and discussed the results with the patient today.  The scan did not show any evidence of disease recurrence.  Dr. Julien Nordmann recommends that he continue on observation with a repeat CT scan of the chest in 6 months.  The patient's hemoglobin was slightly elevated.  From review, it appears that his hemoglobin tends to run on the high end of normal.  If his hemoglobin is elevated at his next appointment, we may consider JAK2 mutation testing.  In the meantime, the patient was encouraged to avoid vitamin supplements and excessive consumption of red meat.  Advised to avoid steroids.  We will continue taking his 81 mg baby aspirin.  The patient was advised to call immediately if she has any concerning symptoms in the interval. The patient voices understanding of current disease status and treatment options and is in agreement with the current care plan. All questions were answered. The patient knows to call the clinic with any problems, questions  or concerns. We can certainly see the patient much sooner if necessary   No orders of the defined types were placed in this encounter.     Preston Cowdrey L Leotta Weingarten, PA-C 05/14/22  ADDENDUM: Hematology/Oncology Attending: I had a face-to-face encounter with the patient today.  I reviewed his record, lab, scan and recommended his care plan.  This is a very pleasant 61 years old white male with stage Ia non-small cell lung cancer, adenocarcinoma diagnosed in March 2020 status post left upper lobe wedge resection under the care of Dr. Kipp Brood. The patient is currently on observation and has been doing fine with no concerning complaints. He had repeat CT scan of the chest performed recently.  I personally and independently reviewed the scan images and discussed the result with the patient today. His scan showed no concerning findings for disease recurrence or metastasis. I recommended for him to continue on observation with repeat  CT scan of the chest in 6 months. Regarding the elevated hemoglobin and hematocrit.  This is likely reactive in nature but we advised the patient to decrease the iron rich diet for the next few months and if no improvement, will consider running a JAK2 mutation panel to rule out underlying polycythemia vera. The patient was advised to call immediately if he has any other concerning symptoms in the interval. The total time spent in the appointment was 20 minutes. Disclaimer: This note was dictated with voice recognition software. Similar sounding words can inadvertently be transcribed and may be missed upon review. Eilleen Kempf, MD

## 2022-05-15 ENCOUNTER — Other Ambulatory Visit: Payer: Self-pay

## 2022-05-15 ENCOUNTER — Inpatient Hospital Stay (HOSPITAL_BASED_OUTPATIENT_CLINIC_OR_DEPARTMENT_OTHER): Payer: BC Managed Care – PPO | Admitting: Physician Assistant

## 2022-05-15 VITALS — BP 159/98 | HR 94 | Temp 98.1°F | Resp 17 | Ht 74.0 in | Wt 245.2 lb

## 2022-05-15 DIAGNOSIS — E119 Type 2 diabetes mellitus without complications: Secondary | ICD-10-CM | POA: Diagnosis not present

## 2022-05-15 DIAGNOSIS — C3412 Malignant neoplasm of upper lobe, left bronchus or lung: Secondary | ICD-10-CM

## 2022-05-15 DIAGNOSIS — D751 Secondary polycythemia: Secondary | ICD-10-CM | POA: Diagnosis not present

## 2022-05-15 DIAGNOSIS — R718 Other abnormality of red blood cells: Secondary | ICD-10-CM | POA: Diagnosis not present

## 2022-05-15 DIAGNOSIS — Z902 Acquired absence of lung [part of]: Secondary | ICD-10-CM | POA: Diagnosis not present

## 2022-05-15 DIAGNOSIS — Z85118 Personal history of other malignant neoplasm of bronchus and lung: Secondary | ICD-10-CM | POA: Diagnosis not present

## 2022-05-15 DIAGNOSIS — Z87891 Personal history of nicotine dependence: Secondary | ICD-10-CM | POA: Diagnosis not present

## 2022-05-15 DIAGNOSIS — Z79899 Other long term (current) drug therapy: Secondary | ICD-10-CM | POA: Diagnosis not present

## 2022-05-15 DIAGNOSIS — Z7984 Long term (current) use of oral hypoglycemic drugs: Secondary | ICD-10-CM | POA: Diagnosis not present

## 2022-05-15 DIAGNOSIS — Z7982 Long term (current) use of aspirin: Secondary | ICD-10-CM | POA: Diagnosis not present

## 2022-05-31 ENCOUNTER — Other Ambulatory Visit: Payer: Self-pay | Admitting: Family Medicine

## 2022-06-02 NOTE — Telephone Encounter (Signed)
Last refills done by Historical provider

## 2022-06-17 ENCOUNTER — Ambulatory Visit (HOSPITAL_COMMUNITY): Payer: BC Managed Care – PPO | Attending: Cardiovascular Disease

## 2022-06-17 ENCOUNTER — Encounter: Payer: Self-pay | Admitting: Cardiology

## 2022-06-17 DIAGNOSIS — I77810 Thoracic aortic ectasia: Secondary | ICD-10-CM | POA: Insufficient documentation

## 2022-06-17 DIAGNOSIS — Z87891 Personal history of nicotine dependence: Secondary | ICD-10-CM | POA: Diagnosis not present

## 2022-06-17 DIAGNOSIS — E7800 Pure hypercholesterolemia, unspecified: Secondary | ICD-10-CM

## 2022-06-17 DIAGNOSIS — E119 Type 2 diabetes mellitus without complications: Secondary | ICD-10-CM | POA: Insufficient documentation

## 2022-06-17 DIAGNOSIS — R931 Abnormal findings on diagnostic imaging of heart and coronary circulation: Secondary | ICD-10-CM | POA: Diagnosis not present

## 2022-06-17 DIAGNOSIS — I341 Nonrheumatic mitral (valve) prolapse: Secondary | ICD-10-CM | POA: Insufficient documentation

## 2022-06-17 DIAGNOSIS — E785 Hyperlipidemia, unspecified: Secondary | ICD-10-CM | POA: Insufficient documentation

## 2022-06-17 LAB — ECHOCARDIOGRAM COMPLETE
Area-P 1/2: 4.17 cm2
S' Lateral: 2.9 cm

## 2022-06-18 ENCOUNTER — Encounter: Payer: Self-pay | Admitting: Family Medicine

## 2022-06-18 ENCOUNTER — Ambulatory Visit (INDEPENDENT_AMBULATORY_CARE_PROVIDER_SITE_OTHER): Payer: BC Managed Care – PPO | Admitting: Family Medicine

## 2022-06-18 ENCOUNTER — Telehealth: Payer: Self-pay | Admitting: *Deleted

## 2022-06-18 VITALS — BP 110/70 | HR 64 | Temp 98.2°F | Ht 74.0 in | Wt 245.8 lb

## 2022-06-18 DIAGNOSIS — E119 Type 2 diabetes mellitus without complications: Secondary | ICD-10-CM

## 2022-06-18 DIAGNOSIS — I77810 Thoracic aortic ectasia: Secondary | ICD-10-CM

## 2022-06-18 DIAGNOSIS — Z0001 Encounter for general adult medical examination with abnormal findings: Secondary | ICD-10-CM

## 2022-06-18 DIAGNOSIS — E785 Hyperlipidemia, unspecified: Secondary | ICD-10-CM

## 2022-06-18 DIAGNOSIS — Z125 Encounter for screening for malignant neoplasm of prostate: Secondary | ICD-10-CM | POA: Diagnosis not present

## 2022-06-18 DIAGNOSIS — E1169 Type 2 diabetes mellitus with other specified complication: Secondary | ICD-10-CM

## 2022-06-18 DIAGNOSIS — Z114 Encounter for screening for human immunodeficiency virus [HIV]: Secondary | ICD-10-CM | POA: Diagnosis not present

## 2022-06-18 DIAGNOSIS — C3412 Malignant neoplasm of upper lobe, left bronchus or lung: Secondary | ICD-10-CM

## 2022-06-18 LAB — MICROALBUMIN / CREATININE URINE RATIO
Creatinine,U: 81.4 mg/dL
Microalb Creat Ratio: 0.9 mg/g (ref 0.0–30.0)
Microalb, Ur: <0.7 mg/dL (ref 0.0–1.9)

## 2022-06-18 LAB — COMPREHENSIVE METABOLIC PANEL
ALT: 36 U/L (ref 0–53)
AST: 23 U/L (ref 0–37)
Albumin: 4.7 g/dL (ref 3.5–5.2)
Alkaline Phosphatase: 89 U/L (ref 39–117)
BUN: 15 mg/dL (ref 6–23)
CO2: 25 mEq/L (ref 19–32)
Calcium: 9.7 mg/dL (ref 8.4–10.5)
Chloride: 103 mEq/L (ref 96–112)
Creatinine, Ser: 1.02 mg/dL (ref 0.40–1.50)
GFR: 80.52 mL/min (ref >60.00)
Glucose, Bld: 147 mg/dL — ABNORMAL HIGH (ref 70–99)
Potassium: 4.6 mEq/L (ref 3.5–5.1)
Sodium: 138 mEq/L (ref 135–145)
Total Bilirubin: 1.4 mg/dL — ABNORMAL HIGH (ref 0.2–1.2)
Total Protein: 6.6 g/dL (ref 6.0–8.3)

## 2022-06-18 LAB — CBC
HCT: 49.2 % (ref 39.0–52.0)
Hemoglobin: 17.1 g/dL — ABNORMAL HIGH (ref 13.0–17.0)
MCHC: 34.7 g/dL (ref 30.0–36.0)
MCV: 83.7 fl (ref 78.0–100.0)
Platelets: 233 10*3/uL (ref 150.0–400.0)
RBC: 5.88 Mil/uL — ABNORMAL HIGH (ref 4.22–5.81)
RDW: 13.6 % (ref 11.5–15.5)
WBC: 5.5 10*3/uL (ref 4.0–10.5)

## 2022-06-18 LAB — LIPID PANEL
Cholesterol: 128 mg/dL (ref 0–200)
HDL: 44.8 mg/dL (ref >39.00)
LDL Cholesterol: 61 mg/dL (ref 0–99)
NonHDL: 82.78
Total CHOL/HDL Ratio: 3
Triglycerides: 109 mg/dL (ref 0.0–149.0)
VLDL: 21.8 mg/dL (ref 0.0–40.0)

## 2022-06-18 LAB — PSA: PSA: 1.01 ng/mL (ref 0.10–4.00)

## 2022-06-18 LAB — TSH: TSH: 1.17 u[IU]/mL (ref 0.35–5.50)

## 2022-06-18 LAB — HEMOGLOBIN A1C: Hgb A1c MFr Bld: 7.9 % — ABNORMAL HIGH (ref 4.6–6.5)

## 2022-06-18 NOTE — Assessment & Plan Note (Signed)
Follows with oncology.  Doing well.

## 2022-06-18 NOTE — Assessment & Plan Note (Signed)
Check A1c today with labs.  Currently on Trulicity 1.5 mg weekly, metformin 1000 mg daily, Farxiga 10 mg daily.  Depending on results of A1c will likely increase his Trulicity to 3 mg weekly.  We discussed diet and exercise.

## 2022-06-18 NOTE — Assessment & Plan Note (Signed)
On Lipitor 80 mg daily per cardiology.  Check lipids.

## 2022-06-18 NOTE — Progress Notes (Signed)
Chief Complaint:  Preston Gamble is a 61 y.o. male who presents today for his annual comprehensive physical exam.    Assessment/Plan:  Chronic Problems Addressed Today: Diabetes mellitus without complication ([place]) Check A1c today with labs.  Currently on Trulicity 1.5 mg weekly, metformin 1000 mg daily, Farxiga 10 mg daily.  Depending on results of A1c will likely increase his Trulicity to 3 mg weekly.  We discussed diet and exercise.  Dyslipidemia associated with type 2 diabetes mellitus (HCC) On Lipitor 80 mg daily per cardiology.  Check lipids.  Stage I Lung cancer [facility]) s/p resection 09/2020 Follows with oncology.  Doing well.  Preventative Healthcare: Check labs.  Up-to-date on colon cancer screening.  Up-to-date on vaccines.  Patient Counseling(The following topics were reviewed and/or handout was given):  -Nutrition: Stressed importance of moderation in sodium/caffeine intake, saturated fat and cholesterol, caloric balance, sufficient intake of fresh fruits, vegetables, and fiber.  -Stressed the importance of regular exercise.   -Substance Abuse: Discussed cessation/primary prevention of tobacco, alcohol, or other drug use; driving or other dangerous activities under the influence; availability of treatment for abuse.   -Injury prevention: Discussed safety belts, safety helmets, smoke detector, smoking near bedding or upholstery.   -Sexuality: Discussed sexually transmitted diseases, partner selection, use of condoms, avoidance of unintended pregnancy and contraceptive alternatives.   -Dental health: Discussed importance of regular tooth brushing, flossing, and dental visits.  -Health maintenance and immunizations reviewed. Please refer to Health maintenance section.  Return to care in 1 year for next preventative visit.     Subjective:  HPI:  He has no acute complaints today.   Lifestyle Diet: Balanced though does admit to eating more sweets than he should.   Exercise: Walk 3-4 times per day.      06/18/2022    7:45 AM  Depression screen PHQ 2/9  Decreased Interest 0  Down, Depressed, Hopeless 0  PHQ - 2 Score 0    Health Maintenance Due  Topic Date Due   FOOT EXAM  Never done   Diabetic kidney evaluation - Urine ACR  03/02/2016   Zoster Vaccines- Shingrix (2 of 2) 12/02/2019     ROS: Per HPI, otherwise a complete review of systems was negative.   PMH:  The following were reviewed and entered/updated in epic: Past Medical History:  Diagnosis Date   Acquired dilation of ascending aorta and aortic root ([place]) 05/27/2015   8mm ascending and 24mm root by echo 06/2022   Cancer ([place])    lung cancer- dx 06-2020   Chest pain    due to injury   Diabetes mellitus without complication ([place])    Family history of early CAD 05/17/2016   Hyperlipidemia    Mitral valve prolapse    with mild MR   Umbilical hernia    Patient Active Problem List   Diagnosis Date Noted   Erectile dysfunction 03/13/2022   Left upper lobe pulmonary nodule 09/06/2020   Stage I Lung cancer ([place]) s/p resection 09/2020 07/25/2020   Family history of early CAD 05/17/2016   MVP (mitral valve prolapse) 05/16/2016   Acquired dilation of ascending aorta and aortic root ([place]) 05/27/2015   Diabetes mellitus without complication ([place])    Dyslipidemia associated with type 2 diabetes mellitus [facility])    Past Surgical History:  Procedure Laterality Date   BRONCHIAL BIOPSY  09/06/2020   part of lobe removed (upper) Procedure: BRONCHIAL BIOPSIES;  Surgeon: Garner Nash, DO;  Location: [place] [facility];  Service: Pulmonary;;  BRONCHIAL BRUSHINGS  09/06/2020   Procedure: BRONCHIAL BRUSHINGS;  Surgeon: Garner Nash, DO;  Location: [place] [facility];  Service: Pulmonary;;   BRONCHIAL NEEDLE ASPIRATION BIOPSY  09/06/2020   Procedure: BRONCHIAL NEEDLE ASPIRATION BIOPSIES;  Surgeon: Garner Nash, DO;  Location: [place] [facility];  Service: Pulmonary;;   COLONOSCOPY     HERNIA REPAIR      INTERCOSTAL NERVE BLOCK Left 09/06/2020   Procedure: INTERCOSTAL NERVE BLOCK;  Surgeon: Lajuana Matte, MD;  Location: [place];  Service: Thoracic;  Laterality: Left;   NECK SURGERY     PLANTAR FASCIA RELEASE     SCLEROTHERAPY  09/06/2020   Procedure: SCLEROTHERAPY;  Surgeon: Garner Nash, DO;  Location: [place];  Service: Pulmonary;;   SEPTOPLASTY  8119   UMBILICAL HERNIA REPAIR  07/09/2012   Procedure: HERNIA REPAIR UMBILICAL ADULT;  Surgeon: Haywood Lasso, MD;  Location: [place];  Service: General;  Laterality: N/A;  repair umbilical hernia   VIDEO BRONCHOSCOPY WITH ENDOBRONCHIAL NAVIGATION Left 09/06/2020   Procedure: VIDEO BRONCHOSCOPY WITH ENDOBRONCHIAL NAVIGATION;  Surgeon: Garner Nash, DO;  Location: [place];  Service: Pulmonary;  Laterality: Left;  biopsies and fiducial dye marking, MB + ICG    Family History  Problem Relation Age of Onset   CAD Father    Diabetes Father    Hyperlipidemia Sister    Diabetes Sister    Diabetes Maternal Grandmother    Diabetes Maternal Grandfather    Diabetes Paternal Grandmother    Diabetes Paternal Grandfather    Colon cancer Neg Hx    Pancreatic cancer Neg Hx    Stomach cancer Neg Hx    Esophageal cancer Neg Hx    Rectal cancer Neg Hx     Medications- reviewed and updated Current Outpatient Medications  Medication Sig Dispense Refill   aspirin EC 81 MG tablet Take 81 mg by mouth daily.     atorvastatin (LIPITOR) 80 MG tablet TAKE 1 TABLET(80 MG) BY MOUTH DAILY 90 tablet 2   cholecalciferol (VITAMIN D3) 25 MCG (1000 UNIT) tablet Take 1,000 Units by mouth daily.     FARXIGA 10 MG TABS tablet TAKE 1 TABLET BY MOUTH EVERY DAY BEFORE BREAKFAST 90 tablet 3   metFORMIN (GLUCOPHAGE) 1000 MG tablet TAKE 1 TABLET BY MOUTH EVERY DAY 90 tablet 3   TRULICITY 1.5 JY/7.8GN SOPN Inject 1.5 mg into the skin once a week. 6 mL 3   No current facility-administered medications for this visit.     Allergies-reviewed and updated No Known Allergies  Social History   Socioeconomic History   Marital status: Married    Spouse name: Not on file   Number of children: Not on file   Years of education: Not on file   Highest education level: Not on file  Occupational History   Not on file  Tobacco Use   Smoking status: Former    Types: Cigarettes    Quit date: 05/20/1997    Years since quitting: 25.0   Smokeless tobacco: Never  Vaping Use   Vaping Use: Never used  Substance and Sexual Activity   Alcohol use: Yes    Alcohol/week: 3.0 standard drinks of alcohol    Types: 3 Cans of beer per week    Comment: occasional   Drug use: No   Sexual activity: Not on file  Other Topics Concern   Not on file  Social History Narrative   Not on file   Social Determinants of Health  Financial Resource Strain: Not on file  Food Insecurity: Not on file  Transportation Needs: Not on file  Physical Activity: Not on file  Stress: Not on file  Social Connections: Not on file        Objective:  Physical Exam: BP 110/70   Pulse 64   Temp 98.2 F (36.8 C) (Temporal)   Ht 6\' 2"  (1.88 m)   Wt 245 lb 12.8 oz (111.5 kg)   SpO2 98%   BMI 31.56 kg/m   Body mass index is 31.56 kg/m. Wt Readings from Last 3 Encounters:  06/18/22 245 lb 12.8 oz (111.5 kg)  05/15/22 245 lb 3.2 oz (111.2 kg)  03/13/22 241 lb 12.8 oz (109.7 kg)   Gen: NAD, resting comfortably HEENT: TMs normal bilaterally. OP clear. No thyromegaly noted.  CV: RRR with no murmurs appreciated Pulm: NWOB, CTAB with no crackles, wheezes, or rhonchi GI: Normal bowel sounds present. Soft, Nontender, Nondistended. MSK: no edema, cyanosis, or clubbing noted Skin: warm, dry Neuro: CN2-12 grossly intact. Strength 5/5 in upper and lower extremities. Reflexes symmetric and intact bilaterally.  Psych: Normal affect and thought content     Annaliese Saez M. Jerline Pain, MD 06/18/2022 8:26 AM

## 2022-06-18 NOTE — Telephone Encounter (Signed)
The patient has been notified of the result and verbalized understanding.  All questions (if any) were answered.  Pt aware we will go ahead and order for repeat echo in one year and send a message to our [facility] Schedulers to call him back and arrange this appt closer to that time.   Pt will be due to see Dr. Radford Pax for his one year OV appt in March.  Scheduled the pt a follow-up appt with Dr. Radford Pax for 09/06/22 at 1140.  He is aware to arrive 15 mins prior to that appt.    Pt verbalized understanding and agrees with this plan.

## 2022-06-18 NOTE — Telephone Encounter (Signed)
-----   Message from Sueanne Margarita, MD sent at 06/17/2022 12:56 PM EST ----- 2D echo showed normal LVF with moderately LAE, mildly dilated aortic root at 47mm and ascending aorta at 74mm.  Repeat echo in 1 year for dilated aorta

## 2022-06-18 NOTE — Patient Instructions (Signed)
It was very nice to see you today!  Will check blood work today.  We will probably increase the Trulicity depending on the results of your A1c.  I will see you back in a year for your next physical.  We will see back in 3 to 6 months to recheck your A1c depending on results of your blood work.  Take care, Dr Jerline Pain  PLEASE NOTE:  If you had any lab tests please let us know if you have not heard back within a few days. You may see your results on mychart before we have a chance to review them but we will give you a call once they are reviewed by [place]. If we ordered any referrals today, please let us know if you have not heard from their office within the next week.   Please try these tips to maintain a healthy lifestyle:  Eat at least 3 REAL meals and 1-2 snacks per day.  Aim for no more than 5 hours between eating.  If you eat breakfast, please do so within one hour of getting up.   Each meal should contain half fruits/vegetables, one quarter protein, and one quarter carbs (no bigger than a computer mouse)  Cut down on sweet beverages. This includes juice, soda, and sweet tea.   Drink at least 1 glass of water with each meal and aim for at least 8 glasses per day  Exercise at least 150 minutes every week.     Preventive Care 26-50 Years Old, Male Preventive care refers to lifestyle choices and visits with your health care provider that can promote health and wellness. Preventive care visits are also called wellness exams. What can I expect for my preventive care visit? Counseling During your preventive care visit, your health care provider may ask about your: Medical history, including: Past medical problems. Family medical history. Current health, including: Emotional well-being. Home life and relationship well-being. Sexual activity. Lifestyle, including: Alcohol, nicotine or tobacco, and drug use. Access to firearms. Diet, exercise, and sleep habits. Safety issues such  as seatbelt and bike helmet use. Sunscreen use. Work and work Statistician. Physical exam Your health care provider will check your: Height and weight. These may be used to calculate your BMI (body mass index). BMI is a measurement that tells if you are at a healthy weight. Waist circumference. This measures the distance around your waistline. This measurement also tells if you are at a healthy weight and may help predict your risk of certain diseases, such as type 2 diabetes and high blood pressure. Heart rate and blood pressure. Body temperature. Skin for abnormal spots. What immunizations do I need?  Vaccines are usually given at various ages, according to a schedule. Your health care provider will recommend vaccines for you based on your age, medical history, and lifestyle or other factors, such as travel or where you work. What tests do I need? Screening Your health care provider may recommend screening tests for certain conditions. This may include: Lipid and cholesterol levels. Diabetes screening. This is done by checking your blood sugar (glucose) after you have not eaten for a while (fasting). Hepatitis B test. Hepatitis C test. HIV (human immunodeficiency virus) test. STI (sexually transmitted infection) testing, if you are at risk. Lung cancer screening. Prostate cancer screening. Colorectal cancer screening. Talk with your health care provider about your test results, treatment options, and if necessary, the need for more tests. Follow these instructions at home: Eating and drinking  Eat a diet  that includes fresh fruits and vegetables, whole grains, lean protein, and low-fat dairy products. Take vitamin and mineral supplements as recommended by your health care provider. Do not drink alcohol if your health care provider tells you not to drink. If you drink alcohol: Limit how much you have to 0-2 drinks a day. Know how much alcohol is in your drink. In the [place] one drink  equals one 12 oz bottle of beer (355 mL), one 5 oz glass of wine (148 mL), or one 1 oz glass of hard liquor (44 mL). Lifestyle Brush your teeth every morning and night with fluoride toothpaste. Floss one time each day. Exercise for at least 30 minutes 5 or more days each week. Do not use any products that contain nicotine or tobacco. These products include cigarettes, chewing tobacco, and vaping devices, such as e-cigarettes. If you need help quitting, ask your health care provider. Do not use drugs. If you are sexually active, practice safe sex. Use a condom or other form of protection to prevent STIs. Take aspirin only as told by your health care provider. Make sure that you understand how much to take and what form to take. Work with your health care provider to find out whether it is safe and beneficial for you to take aspirin daily. Find healthy ways to manage stress, such as: Meditation, yoga, or listening to music. Journaling. Talking to a trusted person. Spending time with friends and family. Minimize exposure to UV radiation to reduce your risk of skin cancer. Safety Always wear your seat belt while driving or riding in a vehicle. Do not drive: If you have been drinking alcohol. Do not ride with someone who has been drinking. When you are tired or distracted. While texting. If you have been using any mind-altering substances or drugs. Wear a helmet and other protective equipment during sports activities. If you have firearms in your house, make sure you follow all gun safety procedures. What's next? Go to your health care provider once a year for an annual wellness visit. Ask your health care provider how often you should have your eyes and teeth checked. Stay up to date on all vaccines. This information is not intended to replace advice given to you by your health care provider. Make sure you discuss any questions you have with your health care provider. Document Revised:  12/20/2020 Document Reviewed: 12/20/2020 Elsevier Patient Education  [place].

## 2022-06-19 LAB — HIV ANTIBODY (ROUTINE TESTING W REFLEX): HIV 1&2 Ab, 4th Generation: NONREACTIVE

## 2022-06-21 NOTE — Progress Notes (Signed)
Please inform patient of the following:  A1c is 7.9.  We should increase the dose of Trulicity as we discussed at his office visit.  Please send a new prescription for 3 mg weekly.  Rest of his labs are all stable compared to previous labs.  He did not need to make any other changes to his treatment plan at this time.  We can recheck everything in a year.  I like to see him back in 3 months to recheck his A1c.

## 2022-07-15 DIAGNOSIS — H5203 Hypermetropia, bilateral: Secondary | ICD-10-CM | POA: Diagnosis not present

## 2022-07-15 DIAGNOSIS — E119 Type 2 diabetes mellitus without complications: Secondary | ICD-10-CM | POA: Diagnosis not present

## 2022-07-15 DIAGNOSIS — H2513 Age-related nuclear cataract, bilateral: Secondary | ICD-10-CM | POA: Diagnosis not present

## 2022-07-15 LAB — HM DIABETES EYE EXAM

## 2022-07-17 ENCOUNTER — Encounter: Payer: Self-pay | Admitting: Family Medicine

## 2022-08-17 ENCOUNTER — Other Ambulatory Visit: Payer: Self-pay | Admitting: Cardiology

## 2022-08-22 ENCOUNTER — Encounter: Payer: Self-pay | Admitting: Cardiology

## 2022-08-22 NOTE — Telephone Encounter (Signed)
Error

## 2022-09-06 ENCOUNTER — Ambulatory Visit: Payer: BC Managed Care – PPO | Admitting: Cardiology

## 2022-09-25 ENCOUNTER — Telehealth: Payer: Self-pay | Admitting: Family Medicine

## 2022-09-25 NOTE — Telephone Encounter (Signed)
[facility] called and states they are on backorder for Trulicty, even at [facility]. Please advise.

## 2022-09-26 NOTE — Telephone Encounter (Signed)
His las A1c was not controlled. We do need to have him on something.   We can send in ozempic 1mg  weekly. This is equivalent to the trulicity that he has been taking. Algis [place]. Jerline Pain, MD 09/26/2022 12:12 PM

## 2022-09-26 NOTE — Telephone Encounter (Signed)
Patient has called multiple pharmacies and every store is back ordered. Wondering if there is an oral form of any medications he can try or if he even needs it at this point. Advised I would route message to PCP.

## 2022-09-27 ENCOUNTER — Other Ambulatory Visit (HOSPITAL_BASED_OUTPATIENT_CLINIC_OR_DEPARTMENT_OTHER): Payer: Self-pay

## 2022-09-27 ENCOUNTER — Other Ambulatory Visit: Payer: Self-pay

## 2022-09-27 MED ORDER — SEMAGLUTIDE (1 MG/DOSE) 4 MG/3ML ~~LOC~~ SOPN
1.0000 mg | PEN_INJECTOR | SUBCUTANEOUS | 1 refills | Status: DC
  Filled 2022-09-27: qty 3, 28d supply, fill #0
  Filled 2022-12-05: qty 3, 28d supply, fill #1

## 2022-09-27 NOTE — Telephone Encounter (Signed)
Ozempic 1 mg sent to [facility]. MyChart message sent to patient.

## 2022-10-03 ENCOUNTER — Other Ambulatory Visit (HOSPITAL_BASED_OUTPATIENT_CLINIC_OR_DEPARTMENT_OTHER): Payer: Self-pay

## 2022-10-09 ENCOUNTER — Ambulatory Visit: Payer: BC Managed Care – PPO | Admitting: Cardiology

## 2022-11-11 ENCOUNTER — Encounter: Payer: Self-pay | Admitting: Family Medicine

## 2022-11-11 ENCOUNTER — Ambulatory Visit (INDEPENDENT_AMBULATORY_CARE_PROVIDER_SITE_OTHER): Payer: BC Managed Care – PPO | Admitting: Family Medicine

## 2022-11-11 VITALS — BP 125/73 | HR 67 | Temp 97.8°F | Ht 74.0 in | Wt 238.4 lb

## 2022-11-11 DIAGNOSIS — Z7985 Long-term (current) use of injectable non-insulin antidiabetic drugs: Secondary | ICD-10-CM | POA: Diagnosis not present

## 2022-11-11 DIAGNOSIS — Z7984 Long term (current) use of oral hypoglycemic drugs: Secondary | ICD-10-CM | POA: Diagnosis not present

## 2022-11-11 DIAGNOSIS — M25521 Pain in right elbow: Secondary | ICD-10-CM | POA: Diagnosis not present

## 2022-11-11 DIAGNOSIS — E119 Type 2 diabetes mellitus without complications: Secondary | ICD-10-CM | POA: Diagnosis not present

## 2022-11-11 LAB — POCT GLYCOSYLATED HEMOGLOBIN (HGB A1C): Hemoglobin A1C: 8.2 % — AB (ref 4.0–5.6)

## 2022-11-11 MED ORDER — DICLOFENAC SODIUM 75 MG PO TBEC: 75.0000 mg | DELAYED_RELEASE_TABLET | Freq: Two times a day (BID) | ORAL | 0 refills | Status: DC

## 2022-11-11 NOTE — Assessment & Plan Note (Addendum)
A1c not controlled 8.2.  He will continue metformin 1000 mg daily and Farxiga 10 mg daily.  Does admit to not being consistent with his GLP-1 agonist and will try to be more consistent going forward.  He is prescribed Ozempic 1 mg weekly.  But has not taken any medication recently the last few months.  Will start back  with 0.25 mg weekly for 4 weeks and increase to 0.5 mg weekly then increase to 1 mg weekly.  Come back in 3 months to recheck A1c.

## 2022-11-11 NOTE — Patient Instructions (Signed)
It was very nice to see you today!  You have tennis elbow.  Please use the strap.  Work on the exercises.  Take the diclofenac.  You may have a groin strain.  Please let us know if not improving.  Return in about 3 months (around 02/11/2023).   Take care, Dr Jimmey Ralph  PLEASE NOTE:  If you had any lab tests, please let us know if you have not heard back within a few days. You may see your results on mychart before we have a chance to review them but we will give you a call once they are reviewed by [place].   If we ordered any referrals today, please let us know if you have not heard from their office within the next week.   If you had any urgent prescriptions sent in today, please check with the pharmacy within an hour of our visit to make sure the prescription was transmitted appropriately.   Please try these tips to maintain a healthy lifestyle:  Eat at least 3 REAL meals and 1-2 snacks per day.  Aim for no more than 5 hours between eating.  If you eat breakfast, please do so within one hour of getting up.   Each meal should contain half fruits/vegetables, one quarter protein, and one quarter carbs (no bigger than a computer mouse)  Cut down on sweet beverages. This includes juice, soda, and sweet tea.   Drink at least 1 glass of water with each meal and aim for at least 8 glasses per day  Exercise at least 150 minutes every week.

## 2022-11-11 NOTE — Progress Notes (Signed)
   Preston Gamble is a 62 y.o. male who presents today for an office visit.  Assessment/Plan:  New/Acute Problems: Right Elbow Pain Consistent with lateral epicondylitis.  He is already using counterforce bracing and will continue this.  We discussed home exercise program and handout was given.  He can also use ice to the area.  Will start diclofenac 75 mg twice daily for the next couple of weeks.  He will let us know if not improving and we can refer to PT.  Right Groin Pain No significant abnormalities on exam.  Likely had muscular strain.  Inguinal hernia is also a consideration however is not palpable on today's exam.  Should have some improvement with above diclofenac.  He will let us know if not improving and we can check ultrasound to rule out hernia.  Chronic Problems Addressed Today: Diabetes mellitus without complication (HCC) A1c not controlled 8.2.  He will continue metformin 1000 mg daily and Farxiga 10 mg daily.  Does admit to not being consistent with his GLP-1 agonist and will try to be more consistent going forward.  He is prescribed Ozempic 1 mg weekly.  But has not taken any medication recently the last few months.  Will start back  with 0.25 mg weekly for 4 weeks and increase to 0.5 mg weekly then increase to 1 mg weekly.  Come back in 3 months to recheck A1c.    Subjective:  HPI:  See A/p for status of chronic conditions   He is here today for follow up.  We last saw him about 6 months ago.  A1c was 7.9.  He is currently on metformin 1000 mg daily, Ozempic 1 mg weekly, Farxiga 10 mg daily. He has had some trouble getting his glp agonists and has not been taking consistently.   He has been having some issues with right arm pain. This has been going on for about a month. Getting a little worse. Tried using brace with some improvement.  Aleve with modest improvement.  No other treatments tried.  Is also been having some pain in right groin and right testicle.  This  started after he tried to pick up a refrigerator couple weeks ago.  No pain at rest though is worse with certain motions.        Objective:  Physical Exam: BP 125/73   Pulse 67   Temp 97.8 F (36.6 C) (Temporal)   Ht 6\' 2"  (1.88 m)   Wt 238 lb 6.4 oz (108.1 kg)   SpO2 96%   BMI 30.61 kg/m   Gen: No acute distress, resting comfortably CV: Regular rate and rhythm with no murmurs appreciated Pulm: Normal work of breathing, clear to auscultation bilaterally with no crackles, wheezes, or rhonchi MSK - Right Elbow: No deformities.  Tender to palpation along lateral epicondyle.  Pain elicited with resisted extension of right third digit GU: Normal male genitalia.  Testicle palpated without any abnormalities.  No masses or bulges noted with Valsalva. Neuro: Grossly normal, moves all extremities Psych: Normal affect and thought content      Morgana Rowley M. Jimmey Ralph, MD 11/11/2022 2:45 PM

## 2022-11-13 ENCOUNTER — Telehealth: Payer: Self-pay | Admitting: Internal Medicine

## 2022-11-13 ENCOUNTER — Other Ambulatory Visit: Payer: Self-pay

## 2022-11-13 ENCOUNTER — Inpatient Hospital Stay: Payer: BC Managed Care – PPO | Attending: Internal Medicine

## 2022-11-13 ENCOUNTER — Ambulatory Visit (HOSPITAL_COMMUNITY)
Admission: RE | Admit: 2022-11-13 | Discharge: 2022-11-13 | Disposition: A | Discharge status: Home / Self Care | Payer: BC Managed Care – PPO | Source: Ambulatory Visit | Attending: Physician Assistant | Admitting: Physician Assistant

## 2022-11-13 DIAGNOSIS — Z902 Acquired absence of lung [part of]: Secondary | ICD-10-CM | POA: Insufficient documentation

## 2022-11-13 DIAGNOSIS — C3412 Malignant neoplasm of upper lobe, left bronchus or lung: Secondary | ICD-10-CM | POA: Insufficient documentation

## 2022-11-13 DIAGNOSIS — Z8249 Family history of ischemic heart disease and other diseases of the circulatory system: Secondary | ICD-10-CM | POA: Diagnosis not present

## 2022-11-13 DIAGNOSIS — Z87891 Personal history of nicotine dependence: Secondary | ICD-10-CM | POA: Insufficient documentation

## 2022-11-13 DIAGNOSIS — Z85118 Personal history of other malignant neoplasm of bronchus and lung: Secondary | ICD-10-CM | POA: Insufficient documentation

## 2022-11-13 DIAGNOSIS — C3490 Malignant neoplasm of unspecified part of unspecified bronchus or lung: Secondary | ICD-10-CM | POA: Diagnosis not present

## 2022-11-13 LAB — CMP (CANCER CENTER ONLY)
ALT: 27 U/L (ref 0–44)
AST: 20 U/L (ref 15–41)
Albumin: 4.5 g/dL (ref 3.5–5.0)
Alkaline Phosphatase: 103 U/L (ref 38–126)
Anion gap: 6 (ref 5–15)
BUN: 15 mg/dL (ref 6–20)
CO2: 27 mmol/L (ref 22–32)
Calcium: 9.4 mg/dL (ref 8.9–10.3)
Chloride: 104 mmol/L (ref 98–111)
Creatinine: 0.93 mg/dL (ref 0.61–1.24)
GFR, Estimated: >60 mL/min (ref >60)
Glucose, Bld: 194 mg/dL — ABNORMAL HIGH (ref 70–99)
Potassium: 4.3 mmol/L (ref 3.5–5.1)
Sodium: 137 mmol/L (ref 135–145)
Total Bilirubin: 1.4 mg/dL — ABNORMAL HIGH (ref 0.3–1.2)
Total Protein: 6.8 g/dL (ref 6.5–8.1)

## 2022-11-13 LAB — CBC WITH DIFFERENTIAL (CANCER CENTER ONLY)
Abs Immature Granulocytes: 0.01 10*3/uL (ref 0.00–0.07)
Basophils Absolute: 0.1 10*3/uL (ref 0.0–0.1)
Basophils Relative: 1 %
Eosinophils Absolute: 0.2 10*3/uL (ref 0.0–0.5)
Eosinophils Relative: 4 %
HCT: 47.1 % (ref 39.0–52.0)
Hemoglobin: 16.6 g/dL (ref 13.0–17.0)
Immature Granulocytes: 0 %
Lymphocytes Relative: 15 %
Lymphs Abs: 0.9 10*3/uL (ref 0.7–4.0)
MCH: 28.6 pg (ref 26.0–34.0)
MCHC: 35.2 g/dL (ref 30.0–36.0)
MCV: 81.2 fL (ref 80.0–100.0)
Monocytes Absolute: 0.5 10*3/uL (ref 0.1–1.0)
Monocytes Relative: 8 %
Neutro Abs: 4.1 10*3/uL (ref 1.7–7.7)
Neutrophils Relative %: 72 %
Platelet Count: 236 10*3/uL (ref 150–400)
RBC: 5.8 MIL/uL (ref 4.22–5.81)
RDW: 12.5 % (ref 11.5–15.5)
WBC Count: 5.8 10*3/uL (ref 4.0–10.5)
nRBC: 0 % (ref 0.0–0.2)

## 2022-11-13 MED ORDER — IOHEXOL 300 MG/ML  SOLN: 75.0000 mL | Freq: Once | INTRAMUSCULAR | Status: DC | PRN

## 2022-11-13 MED ORDER — SODIUM CHLORIDE (PF) 0.9 % IJ SOLN
INTRAMUSCULAR | Status: AC
  Filled 2022-11-13: qty 50

## 2022-11-13 MED ORDER — IOHEXOL 300 MG/ML  SOLN
75.0000 mL | Freq: Once | INTRAMUSCULAR | Status: AC | PRN
  Administered 2022-11-13: 75 mL via INTRAVENOUS

## 2022-11-13 NOTE — Telephone Encounter (Signed)
Called patient regarding May appointments, patient is notified.  

## 2022-11-18 ENCOUNTER — Other Ambulatory Visit: Payer: Self-pay

## 2022-11-18 ENCOUNTER — Inpatient Hospital Stay (HOSPITAL_BASED_OUTPATIENT_CLINIC_OR_DEPARTMENT_OTHER): Payer: BC Managed Care – PPO | Admitting: Internal Medicine

## 2022-11-18 VITALS — BP 127/71 | HR 65 | Temp 97.6°F | Resp 13 | Wt 240.6 lb

## 2022-11-18 DIAGNOSIS — Z85118 Personal history of other malignant neoplasm of bronchus and lung: Secondary | ICD-10-CM | POA: Diagnosis not present

## 2022-11-18 DIAGNOSIS — C3490 Malignant neoplasm of unspecified part of unspecified bronchus or lung: Secondary | ICD-10-CM

## 2022-11-18 DIAGNOSIS — Z902 Acquired absence of lung [part of]: Secondary | ICD-10-CM | POA: Diagnosis not present

## 2022-11-18 DIAGNOSIS — Z87891 Personal history of nicotine dependence: Secondary | ICD-10-CM | POA: Diagnosis not present

## 2022-11-18 DIAGNOSIS — Z8249 Family history of ischemic heart disease and other diseases of the circulatory system: Secondary | ICD-10-CM | POA: Diagnosis not present

## 2022-11-18 NOTE — Progress Notes (Signed)
[facility] [phone]   [phone]  OFFICE PROGRESS NOTE  Ardith Dark, MD [address] [place] [address]  DIAGNOSIS: Stage IA (T1b, N0, M0) non-small cell lung cancer, adenocarcinoma, acinar predominant diagnosed in March 2022  PRIOR THERAPY: Status post left upper lobe wedge resection under the care of Dr. Cliffton Asters on September 06, 2020.  CURRENT THERAPY: Observation.  INTERVAL HISTORY: Preston Gamble 62 y.o. male returns to the clinic today for follow-up visit.  The patient is feeling fine today with no concerning complaints.  He has no chest pain, shortness of breath, cough or hemoptysis.  He has no nausea, vomiting, diarrhea or constipation.  He has no headache or visual changes.  He denied having any recent weight loss or night sweats.  He is here today for evaluation with repeat CT scan of the chest for restaging of his disease.  MEDICAL HISTORY: Past Medical History:  Diagnosis Date   Acquired dilation of ascending aorta and aortic root (HCC) 05/27/2015   40mm ascending and 41mm root by echo 06/2022   Cancer (HCC)    lung cancer- dx 06-2020   Chest pain    due to injury   Diabetes mellitus without complication [facility])    Family history of early CAD 05/17/2016   Hyperlipidemia    Mitral valve prolapse    with mild MR   Umbilical hernia     ALLERGIES:  has No Known Allergies.  MEDICATIONS:  Current Outpatient Medications  Medication Sig Dispense Refill   aspirin EC 81 MG tablet Take 81 mg by mouth daily.     atorvastatin (LIPITOR) 80 MG tablet TAKE 1 TABLET(80 MG) BY MOUTH DAILY 90 tablet 1   cholecalciferol (VITAMIN D3) 25 MCG (1000 UNIT) tablet Take 1,000 Units by mouth daily.     diclofenac (VOLTAREN) 75 MG EC tablet Take 1 tablet (75 mg total) by mouth 2 (two) times daily. 30 tablet 0   FARXIGA 10 MG TABS tablet TAKE 1 TABLET BY MOUTH EVERY DAY BEFORE BREAKFAST 90 tablet 3   metFORMIN (GLUCOPHAGE) 1000 MG tablet  TAKE 1 TABLET BY MOUTH EVERY DAY 90 tablet 3   Semaglutide, 1 MG/DOSE, 4 MG/3ML SOPN Inject 1 mg as directed once a week. 3 mL 1   No current facility-administered medications for this visit.    SURGICAL HISTORY:  Past Surgical History:  Procedure Laterality Date   BRONCHIAL BIOPSY  09/06/2020   part of lobe removed (upper) Procedure: BRONCHIAL BIOPSIES;  Surgeon: Josephine Igo, DO;  [facility];  Service: Pulmonary;;   BRONCHIAL BRUSHINGS  09/06/2020   Procedure: BRONCHIAL BRUSHINGS;  Surgeon: Josephine Igo, DO;  [facility]: [facility];  Service: Pulmonary;;   BRONCHIAL NEEDLE ASPIRATION BIOPSY  09/06/2020   Procedure: BRONCHIAL NEEDLE ASPIRATION BIOPSIES;  Surgeon: Josephine Igo, DO;  [facility]: [facility];  Service: Pulmonary;;   COLONOSCOPY     HERNIA REPAIR     INTERCOSTAL NERVE BLOCK Left 09/06/2020   Procedure: INTERCOSTAL NERVE BLOCK;  Surgeon: Corliss Skains, MD;  Location: [facility];  Service: Thoracic;  Laterality: Left;   NECK SURGERY     PLANTAR FASCIA RELEASE     SCLEROTHERAPY  09/06/2020   Procedure: SCLEROTHERAPY;  Surgeon: Josephine Igo, DO;  [facility]: [facility];  Service: Pulmonary;;   SEPTOPLASTY  1994   UMBILICAL HERNIA REPAIR  07/09/2012   Procedure: HERNIA REPAIR UMBILICAL ADULT;  Surgeon: Currie Paris, MD;  Location:   SURGERY CENTER;  Service: General;  Laterality: N/A;  repair umbilical hernia   VIDEO BRONCHOSCOPY WITH ENDOBRONCHIAL NAVIGATION Left 09/06/2020   Procedure: VIDEO BRONCHOSCOPY WITH ENDOBRONCHIAL NAVIGATION;  Surgeon: Josephine Igo, DO;  [facility]: [facility];  Service: Pulmonary;  Laterality: Left;  biopsies and fiducial dye marking, MB + ICG    REVIEW OF SYSTEMS:  A comprehensive review of systems was negative.   PHYSICAL EXAMINATION: General appearance: alert, cooperative, and no distress Head: Normocephalic, without obvious abnormality, atraumatic Neck: no adenopathy, no JVD, supple, symmetrical, trachea  midline, and thyroid not enlarged, symmetric, no tenderness/mass/nodules Lymph nodes: Cervical, supraclavicular, and axillary nodes normal. Resp: clear to auscultation bilaterally Back: symmetric, no curvature. ROM normal. No CVA tenderness. Cardio: regular rate and rhythm, S1, S2 normal, no murmur, click, rub or gallop GI: soft, non-tender; bowel sounds normal; no masses,  no organomegaly Extremities: extremities normal, atraumatic, no cyanosis or edema  ECOG PERFORMANCE STATUS: 0 - Asymptomatic  Blood pressure 127/71, pulse 65, temperature 97.6 F (36.4 C), temperature source Oral, resp. rate 13, weight 240 lb 9.6 oz (109.1 kg), SpO2 97 %.  LABORATORY DATA: Lab Results  Component Value Date   WBC 5.8 11/13/2022   HGB 16.6 11/13/2022   HCT 47.1 11/13/2022   MCV 81.2 11/13/2022   PLT 236 11/13/2022      Chemistry      Component Value Date/Time   NA 137 11/13/2022 0926   NA 139 11/20/2020 0000   K 4.3 11/13/2022 0926   CL 104 11/13/2022 0926   CO2 27 11/13/2022 0926   BUN 15 11/13/2022 0926   BUN 15 11/20/2020 0000   CREATININE 0.93 11/13/2022 0926   CREATININE 1.08 06/05/2015 1120   GLU 188 11/20/2020 0000      Component Value Date/Time   CALCIUM 9.4 11/13/2022 0926   ALKPHOS 103 11/13/2022 0926   AST 20 11/13/2022 0926   ALT 27 11/13/2022 0926   BILITOT 1.4 (H) 11/13/2022 0926       RADIOGRAPHIC STUDIES: CT Chest W Contrast  Result Date: 11/16/2022 CLINICAL DATA:  Lung cancer, status post left upper lobectomy, follow-up EXAM: CT CHEST WITH CONTRAST TECHNIQUE: Multidetector CT imaging of the chest was performed during intravenous contrast administration. RADIATION DOSE REDUCTION: This exam was performed according to the departmental dose-optimization program which includes automated exposure control, adjustment of the mA and/or kV according to patient size and/or use of iterative reconstruction technique. CONTRAST:  75mL OMNIPAQUE IOHEXOL 300 MG/ML  SOLN COMPARISON:   05/13/2022 FINDINGS: Cardiovascular: The heart is normal in size. No pericardial effusion. No evidence of thoracic aortic aneurysm. Mild atherosclerotic calcifications of the aortic arch. Moderate coronary atherosclerosis of the LAD. Mediastinum/Nodes: No suspicious mediastinal lymphadenopathy. Visualized thyroid is unremarkable. Lungs/Pleura: Status post left upper lobectomy. No suspicious pulmonary nodules. No focal consolidation. No pleural effusion or pneumothorax. Upper Abdomen: Visualized upper abdomen is notable for a 3.6 cm cyst in the posterior right hepatic lobe (series 4/image 132). Musculoskeletal: Degenerative changes of the visualized thoracolumbar spine. IMPRESSION: Status post left upper lobectomy. No evidence of recurrent or metastatic disease. Aortic Atherosclerosis (ICD10-I70.0). Electronically Signed   By: Charline Bills M.D.   On: 11/16/2022 01:44    ASSESSMENT AND PLAN: This is a very pleasant 62 years old white male with a stage IA (T1b, N0, M0) non-small cell lung cancer, adenocarcinoma, acinar predominant diagnosed in March 2022 status post left upper lobe wedge resection under the care of Dr. Cliffton Asters on September 06, 2020. The  patient is currently on observation and he is feeling fine with no concerning complaints. He had repeat CT scan of the chest performed recently.  I personally and independently reviewed the scan and discussed the result with the patient today. His scan showed no concerning findings for disease recurrence or metastasis. I recommended for him to continue on observation with repeat CT scan of the chest in 1 year. He was advised to call immediately if he has any concerning symptoms in the interval. The patient voices understanding of current disease status and treatment options and is in agreement with the current care plan.  All questions were answered. The patient knows to call the clinic with any problems, questions or concerns. We can certainly see the  patient much sooner if necessary. The total time spent in the appointment was 20 minutes.  Disclaimer: This note was dictated with voice recognition software. Similar sounding words can inadvertently be transcribed and may not be corrected upon review.

## 2022-12-04 DIAGNOSIS — H938X3 Other specified disorders of ear, bilateral: Secondary | ICD-10-CM | POA: Diagnosis not present

## 2022-12-05 ENCOUNTER — Other Ambulatory Visit: Payer: Self-pay

## 2022-12-05 ENCOUNTER — Other Ambulatory Visit: Payer: Self-pay | Admitting: Family Medicine

## 2022-12-05 ENCOUNTER — Other Ambulatory Visit (HOSPITAL_COMMUNITY): Payer: Self-pay

## 2022-12-05 MED ORDER — DICLOFENAC SODIUM 75 MG PO TBEC
75.0000 mg | DELAYED_RELEASE_TABLET | Freq: Two times a day (BID) | ORAL | 0 refills | Status: DC
  Filled 2022-12-05: qty 30, 15d supply, fill #0

## 2022-12-09 ENCOUNTER — Other Ambulatory Visit (HOSPITAL_COMMUNITY): Payer: Self-pay

## 2022-12-26 ENCOUNTER — Encounter: Payer: Self-pay | Admitting: Cardiology

## 2022-12-26 ENCOUNTER — Ambulatory Visit: Payer: BC Managed Care – PPO | Attending: Cardiology | Admitting: Cardiology

## 2022-12-26 VITALS — BP 116/82 | HR 55 | Ht 74.0 in | Wt 240.4 lb

## 2022-12-26 DIAGNOSIS — R931 Abnormal findings on diagnostic imaging of heart and coronary circulation: Secondary | ICD-10-CM | POA: Diagnosis not present

## 2022-12-26 DIAGNOSIS — I341 Nonrheumatic mitral (valve) prolapse: Secondary | ICD-10-CM | POA: Diagnosis not present

## 2022-12-26 DIAGNOSIS — I77810 Thoracic aortic ectasia: Secondary | ICD-10-CM | POA: Diagnosis not present

## 2022-12-26 DIAGNOSIS — E7800 Pure hypercholesterolemia, unspecified: Secondary | ICD-10-CM

## 2022-12-26 NOTE — Progress Notes (Signed)
Date:  06/09/2019   ID:  RYU ASEBEDO, DOB [birth year], MRN [id number]   Cardiology Office Note:    Date:  12/26/2022   ID:  Preston Gamble, DOB 1961/06/25, MRN [id number]  PCP:  Ardith Dark, MD  Cardiologist:  Armanda Magic, MD    Referring MD: Ardith Dark, MD   Chief Complaint  Patient presents with   Follow-up    Coronary artery calcifications, dilated aortic root, hyperlipidemia    History of Present Illness:    Preston Gamble is a 62 y.o. male with a hx of mitral valve prolapse and mild MR as well as dilated aortic root. He also had coronary artery ca+ with a score of 101 which is 77th% for his age and sex.  Since I saw him last he was dx with LUL adeno CA and had a lobectomy.   He is here today for followup and is doing well.  He denies any chest pain or pressure, SOB, DOE (except when walking up hills), PND, orthopnea, LE edema, dizziness, palpitations (except when drinking too much caffeine) or syncope. He is compliant with his meds and is tolerating meds with no SE.    Past Medical History:  Diagnosis Date   Acquired dilation of ascending aorta and aortic root (HCC) 05/27/2015   40mm ascending and 41mm root by echo 06/2022   Cancer (HCC)    lung cancer- dx 06-2020   Chest pain    due to injury   Diabetes mellitus without complication (HCC)    Family history of early CAD 05/17/2016   Hyperlipidemia    Mitral valve prolapse    with mild MR   Umbilical hernia     Past Surgical History:  Procedure Laterality Date   BRONCHIAL BIOPSY  09/06/2020   part of lobe removed (upper) Procedure: BRONCHIAL BIOPSIES;  Surgeon: Josephine Igo, DO;  [facility];  Service: Pulmonary;;   BRONCHIAL BRUSHINGS  09/06/2020   Procedure: BRONCHIAL BRUSHINGS;  Surgeon: Josephine Igo, DO;  [facility];  Service: Pulmonary;;   BRONCHIAL NEEDLE ASPIRATION BIOPSY  09/06/2020   Procedure: BRONCHIAL NEEDLE ASPIRATION BIOPSIES;  Surgeon: Josephine Igo, DO;  [facility];  Service: Pulmonary;;   COLONOSCOPY     HERNIA REPAIR     INTERCOSTAL NERVE BLOCK Left 09/06/2020   Procedure: INTERCOSTAL NERVE BLOCK;  Surgeon: Corliss Skains, MD;  [facility];  Service: Thoracic;  Laterality: Left;   NECK SURGERY     PLANTAR FASCIA RELEASE     SCLEROTHERAPY  09/06/2020   Procedure: SCLEROTHERAPY;  Surgeon: Josephine Igo, DO;  [facility];  Service: Pulmonary;;   SEPTOPLASTY  1994   UMBILICAL HERNIA REPAIR  07/09/2012   Procedure: HERNIA REPAIR UMBILICAL ADULT;  Surgeon: Currie Paris, MD;  Location: [facility];  Service: General;  Laterality: N/A;  repair umbilical hernia   VIDEO BRONCHOSCOPY WITH ENDOBRONCHIAL NAVIGATION Left 09/06/2020   Procedure: VIDEO BRONCHOSCOPY WITH ENDOBRONCHIAL NAVIGATION;  Surgeon: Josephine Igo, DO;  [facility];  Service: Pulmonary;  Laterality: Left;  biopsies and fiducial dye marking, MB + ICG    Current Medications: Current Meds  Medication Sig   aspirin EC 81 MG tablet Take 81 mg by mouth daily.   atorvastatin (LIPITOR) 80 MG tablet TAKE 1 TABLET(80 MG) BY MOUTH DAILY   cholecalciferol (VITAMIN D3) 25 MCG (1000 UNIT) tablet Take 1,000 Units by mouth daily.   FARXIGA 10 MG TABS  tablet TAKE 1 TABLET BY MOUTH EVERY DAY BEFORE BREAKFAST   metFORMIN (GLUCOPHAGE) 1000 MG tablet TAKE 1 TABLET BY MOUTH EVERY DAY   Semaglutide, 1 MG/DOSE, 4 MG/3ML SOPN Inject 1 mg as directed once a week. (Patient taking differently: Inject 1 mg as directed once a week. Per patient off x 3/4 months on backorder)   [DISCONTINUED] diclofenac (VOLTAREN) 75 MG EC tablet Take 1 tablet (75 mg total) by mouth 2 (two) times daily.     Allergies:   Patient has no known allergies.   Social History   Socioeconomic History   Marital status: Married    Spouse name: Not on file   Number of children: Not on file   Years of education: Not on file   Highest education level: Not  on file  Occupational History   Not on file  Tobacco Use   Smoking status: Former    Types: Cigarettes    Quit date: 05/20/1997    Years since quitting: 25.6   Smokeless tobacco: Never  Vaping Use   Vaping Use: Never used  Substance and Sexual Activity   Alcohol use: Yes    Alcohol/week: 3.0 standard drinks of alcohol    Types: 3 Cans of beer per week    Comment: occasional   Drug use: No   Sexual activity: Not on file  Other Topics Concern   Not on file  Social History Narrative   Not on file   Social Determinants of Health   Financial Resource Strain: Not on file  Food Insecurity: Not on file  Transportation Needs: Not on file  Physical Activity: Not on file  Stress: Not on file  Social Connections: Not on file     Family History: The patient's family history includes CAD in his father; Diabetes in his father, maternal grandfather, maternal grandmother, paternal grandfather, paternal grandmother, and sister; Hyperlipidemia in his sister. There is no history of Colon cancer, Pancreatic cancer, Stomach cancer, Esophageal cancer, or Rectal cancer.  ROS:   Please see the history of present illness.    ROS  All other systems reviewed and negative.   EKGs/Labs/Other Studies Reviewed:    The following studies were reviewed today: EKG Interpretation  Date/Time:  Thursday December 26 2022 13:02:23 EDT Ventricular Rate:  55 PR Interval:  164 QRS Duration: 82 QT Interval:  430 QTC Calculation: 411 R Axis:   5 Text Interpretation: Sinus bradycardia When compared with ECG of 04-Sep-2020 15:21, No significant change was found Confirmed by Armanda Magic [phone]) on 12/26/2022 1:28:14 PM    Recent Labs: 06/18/2022: TSH 1.17 11/13/2022: ALT 27; BUN 15; Creatinine 0.93; Hemoglobin 16.6; Platelet Count 236; Potassium 4.3; Sodium 137   Recent Lipid Panel    Component Value Date/Time   CHOL 128 06/18/2022 0826   CHOL 126 11/20/2021 0925   TRIG 109.0 06/18/2022 0826   HDL 44.80  06/18/2022 0826   HDL 43 11/20/2021 0925   CHOLHDL 3 06/18/2022 0826   VLDL 21.8 06/18/2022 0826   LDLCALC 61 06/18/2022 0826   LDLCALC 64 11/20/2021 0925    Physical Exam:    VS:  BP 116/82   Pulse (!) 55   Ht 6\' 2"  (1.88 m)   Wt 240 lb 6.4 oz (109 kg)   SpO2 96%   BMI 30.87 kg/m     GEN: Well nourished, well developed in no acute distress HEENT: Normal NECK: No JVD; No carotid bruits LYMPHATICS: No lymphadenopathy CARDIAC:RRR, no murmurs, rubs, gallops RESPIRATORY:  Clear to auscultation without rales, wheezing or rhonchi  ABDOMEN: Soft, non-tender, non-distended MUSCULOSKELETAL:  No edema; No deformity  SKIN: Warm and dry NEUROLOGIC:  Alert and oriented x 3 PSYCHIATRIC:  Normal affect   Wt Readings from Last 3 Encounters:  12/26/22 240 lb 6.4 oz (109 kg)  11/18/22 240 lb 9.6 oz (109.1 kg)  11/11/22 238 lb 6.4 oz (108.1 kg)      ASSESSMENT:    1. MVP (mitral valve prolapse)   2. Dilated aortic root (HCC)   3. Pure hypercholesterolemia   4. Agatston coronary artery calcium score between 100 and 199    PLAN:    In order of problems listed above:  MVP   -not noted on last echo 06/2021   Dilated aortic root -CT 2/2052mildly dilated at 39mm -2D echo 06/2021 showed 41 mm and 40 mm ascending aorta -2D echo 06/26/2022 with ascending aorta 4 cm and aortic root 4.1 cm -Normal on chest CT on 11/2022   HLD -LDL goal < 70 due to DM and coronary artery calcification -I have personally reviewed and interpreted outside labs performed by patient's PCP which showed LDL 61 and HDL 44 on 06/18/2022 -Continue drug management with atorvastatin 80 mg daily with as needed refills  Coronary artery calcium -Ca score 101 (77th % for his age) -He denies any anginal symptoms -Exercise treadmill test 06/26/2020 showed no ischemia -Continue prescription drug management with aspirin 81 mg daily and statin therapy with as needed refills   Medication Adjustments/Labs and Tests  Ordered: Current medicines are reviewed at length with the patient today.  Concerns regarding medicines are outlined above.  Tests Ordered: Orders Placed This Encounter  Procedures   EKG 12-Lead   Medication Changes: No orders of the defined types were placed in this encounter.   Disposition:  Follow up in 1 year(s)  Signed, Armanda Magic, MD  06/09/2019 9:06 AM     Medication Adjustments/Labs and Tests Ordered: Current medicines are reviewed at length with the patient today.  Concerns regarding medicines are outlined above.  Orders Placed This Encounter  Procedures   EKG 12-Lead   No orders of the defined types were placed in this encounter.   Signed, Armanda Magic, MD  12/26/2022 1:26 PM    [facility]

## 2022-12-26 NOTE — Patient Instructions (Signed)
Medication Instructions:  Your physician recommends that you continue on your current medications as directed. Please refer to the Current Medication list given to you today.  *If you need a refill on your cardiac medications before your next appointment, please call your pharmacy*   Lab Work: None.  If you have labs (blood work) drawn today and your tests are completely normal, you will receive your results only by: MyChart Message (if you have MyChart) OR A paper copy in the mail If you have any lab test that is abnormal or we need to change your treatment, we will call you to review the results.   Testing/Procedures: None.   Follow-Up:   Your next appointment:   1 year(s)  Provider:   Traci Turner, MD     

## 2023-01-17 DIAGNOSIS — M7711 Lateral epicondylitis, right elbow: Secondary | ICD-10-CM | POA: Diagnosis not present

## 2023-01-28 DIAGNOSIS — M7711 Lateral epicondylitis, right elbow: Secondary | ICD-10-CM | POA: Diagnosis not present

## 2023-02-05 ENCOUNTER — Encounter (INDEPENDENT_AMBULATORY_CARE_PROVIDER_SITE_OTHER): Payer: Self-pay

## 2023-02-18 ENCOUNTER — Ambulatory Visit: Payer: BC Managed Care – PPO | Admitting: Family Medicine

## 2023-03-19 ENCOUNTER — Other Ambulatory Visit: Payer: Self-pay | Admitting: Family Medicine

## 2023-03-19 ENCOUNTER — Other Ambulatory Visit: Payer: Self-pay | Admitting: Cardiology

## 2023-05-27 ENCOUNTER — Encounter: Payer: Self-pay | Admitting: Family Medicine

## 2023-05-27 ENCOUNTER — Ambulatory Visit (INDEPENDENT_AMBULATORY_CARE_PROVIDER_SITE_OTHER): Payer: BC Managed Care – PPO | Admitting: Family Medicine

## 2023-05-27 VITALS — BP 112/65 | HR 60 | Temp 98.4°F | Ht 74.0 in | Wt 244.2 lb

## 2023-05-27 DIAGNOSIS — Z7985 Long-term (current) use of injectable non-insulin antidiabetic drugs: Secondary | ICD-10-CM | POA: Diagnosis not present

## 2023-05-27 DIAGNOSIS — E119 Type 2 diabetes mellitus without complications: Secondary | ICD-10-CM

## 2023-05-27 DIAGNOSIS — Z7984 Long term (current) use of oral hypoglycemic drugs: Secondary | ICD-10-CM

## 2023-05-27 DIAGNOSIS — N529 Male erectile dysfunction, unspecified: Secondary | ICD-10-CM | POA: Diagnosis not present

## 2023-05-27 LAB — POCT GLYCOSYLATED HEMOGLOBIN (HGB A1C): Hemoglobin A1C: 8.3 % — AB (ref 4.0–5.6)

## 2023-05-27 MED ORDER — SEMAGLUTIDE (1 MG/DOSE) 4 MG/3ML ~~LOC~~ SOPN: 1.0000 mg | PEN_INJECTOR | SUBCUTANEOUS | 1 refills | Status: DC

## 2023-05-27 MED ORDER — FARXIGA 10 MG PO TABS: 10.0000 mg | ORAL_TABLET | Freq: Every day | ORAL | 3 refills | Status: DC

## 2023-05-27 MED ORDER — METFORMIN HCL 1000 MG PO TABS: 1000.0000 mg | ORAL_TABLET | Freq: Every day | ORAL | 3 refills | Status: DC

## 2023-05-27 MED ORDER — TADALAFIL 10 MG PO TABS: 10.0000 mg | ORAL_TABLET | Freq: Every day | ORAL | 11 refills | Status: DC

## 2023-05-27 NOTE — Patient Instructions (Signed)
It was very nice to see you today!  Your A1c is 8.3.  We need to work on getting this down to the 6 range.  Please continue to work on diet and exercise.  Will see back in 3 months.  Come back sooner if needed.  Return in about 3 months (around 08/27/2023) for Follow Up.   Take care, Dr Jimmey Ralph  PLEASE NOTE:  If you had any lab tests, please let us know if you have not heard back within a few days. You may see your results on mychart before we have a chance to review them but we will give you a call once they are reviewed by [place].   If we ordered any referrals today, please let us know if you have not heard from their office within the next week.   If you had any urgent prescriptions sent in today, please check with the pharmacy within an hour of our visit to make sure the prescription was transmitted appropriately.   Please try these tips to maintain a healthy lifestyle:  Eat at least 3 REAL meals and 1-2 snacks per day.  Aim for no more than 5 hours between eating.  If you eat breakfast, please do so within one hour of getting up.   Each meal should contain half fruits/vegetables, one quarter protein, and one quarter carbs (no bigger than a computer mouse)  Cut down on sweet beverages. This includes juice, soda, and sweet tea.   Drink at least 1 glass of water with each meal and aim for at least 8 glasses per day  Exercise at least 150 minutes every week.

## 2023-05-27 NOTE — Assessment & Plan Note (Signed)
He has done well with Cialis in the past though is interested in trying a higher dose.  Will send in 10 mg tablet.  He is aware of potential side effects.  He will let us know if he has any issues.  Did discuss importance of good glycemic control to improve ED symptoms as well.

## 2023-05-27 NOTE — Assessment & Plan Note (Addendum)
A1c uncontrolled 8.3.  He has been consistent with medications though is having a few dietary indiscretions.  We discussed lifestyle interventions including diet and exercise.  He is interested in seeing a nutritionist and will place referral today.  We did discuss changing medications however he would like to work on diet first before doing this.  Will continue metformin 1000 mg daily, Farxiga 10 mg daily, and Ozempic 1 mg weekly.  He will come back in 3 months to recheck A1c.  If A1c still not at goal we will increase dose of Ozempic.

## 2023-05-27 NOTE — Progress Notes (Signed)
   Preston Gamble is a 62 y.o. male who presents today for an office visit.  Assessment/Plan:  Chronic Problems Addressed Today: Diabetes mellitus without complication (HCC) A1c uncontrolled 8.3.  He has been consistent with medications though is having a few dietary indiscretions.  We discussed lifestyle interventions including diet and exercise.  He is interested in seeing a nutritionist and will place referral today.  We did discuss changing medications however he would like to work on diet first before doing this.  Will continue metformin 1000 mg daily, Farxiga 10 mg daily, and Ozempic 1 mg weekly.  He will come back in 3 months to recheck A1c.  If A1c still not at goal we will increase dose of Ozempic.  Erectile dysfunction He has done well with Cialis in the past though is interested in trying a higher dose.  Will send in 10 mg tablet.  He is aware of potential side effects.  He will let us know if he has any issues.  Did discuss importance of good glycemic control to improve ED symptoms as well.      Subjective:  HPI:  See assessment / plan for status of chronic conditions.  Patient here today for follow-up.  Last seen about 6 months ago.  At that time A1c was 8.2.  We continued him on metformin 1000 mg daily and Farxiga 10 mg daily and we started him back on Ozempic.  He has been compliant with medications though does admit to several dietary indiscretions.  He has been working on being more active via walking.        Objective:  Physical Exam: BP 112/65   Pulse 60   Temp 98.4 F (36.9 C) (Temporal)   Ht 6\' 2"  (1.88 m)   Wt 244 lb 3.2 oz (110.8 kg)   SpO2 97%   BMI 31.35 kg/m   Wt Readings from Last 3 Encounters:  05/27/23 244 lb 3.2 oz (110.8 kg)  12/26/22 240 lb 6.4 oz (109 kg)  11/18/22 240 lb 9.6 oz (109.1 kg)  Gen: No acute distress, resting comfortably CV: Regular rate and rhythm with no murmurs appreciated Pulm: Normal work of breathing, clear to auscultation  bilaterally with no crackles, wheezes, or rhonchi Neuro: Grossly normal, moves all extremities Psych: Normal affect and thought content      Doria Fern M. Jimmey Ralph, MD 05/27/2023 7:50 AM

## 2023-06-17 ENCOUNTER — Ambulatory Visit (HOSPITAL_COMMUNITY): Payer: BC Managed Care – PPO | Attending: Cardiology

## 2023-06-17 DIAGNOSIS — I77810 Thoracic aortic ectasia: Secondary | ICD-10-CM | POA: Diagnosis not present

## 2023-06-17 DIAGNOSIS — I341 Nonrheumatic mitral (valve) prolapse: Secondary | ICD-10-CM | POA: Diagnosis not present

## 2023-06-17 LAB — ECHOCARDIOGRAM COMPLETE
Area-P 1/2: 3.99 cm2
S' Lateral: 2.8 cm

## 2023-06-20 ENCOUNTER — Encounter: Payer: BC Managed Care – PPO | Admitting: Family Medicine

## 2023-06-24 ENCOUNTER — Encounter: Payer: Self-pay | Admitting: Cardiology

## 2023-07-03 ENCOUNTER — Telehealth: Payer: Self-pay

## 2023-07-03 DIAGNOSIS — I77810 Thoracic aortic ectasia: Secondary | ICD-10-CM

## 2023-07-03 NOTE — Telephone Encounter (Signed)
-----   Message from Armanda Magic sent at 06/30/2023  2:51 PM EST ----- Regarding: RE: aortic dimensions Repeat 2D echo in 1 year for dilated aorta ----- Message ----- From: Luellen Pucker, RN Sent: 06/27/2023  11:08 AM EST To: Quintella Reichert, MD; Ardith Dark, MD Subject: aortic dimensions                              Hi Dr. Mayford Knife,  I spoke with Dr. Bradly Chris at [facility] about this patient's Chest CT done 11/13/2022 (I didn't see one from May 2020 in the chart). He says the ascending aorta is 4.1 cm on that reading.  Let me know if any other questions, Alcario Drought, RN ----- Message ----- From: Quintella Reichert, MD Sent: 06/24/2023   7:24 AM EST To: Ardith Dark, MD; Luellen Pucker, RN  Echo showed normal pumping function of the heart muscle with mildly thickened heart muscle mildly leaky mitral valve due to mild mitral valve prolapse and mildly dilated aortic root and ascending aorta.  The ascending aortic dimensions have slightly increased from 40 to 43 mm since last echo 2023.  Please call radiology to see if they can report out on his chest CT from 11/13/2018 for the aortic dimensions

## 2023-07-03 NOTE — Telephone Encounter (Signed)
Call to patient to discuss dilated aorta, patient verbalizes understanding and agrees to repeat echo in one year.

## 2023-07-04 ENCOUNTER — Other Ambulatory Visit: Payer: Self-pay | Admitting: Family Medicine

## 2023-07-07 ENCOUNTER — Other Ambulatory Visit: Payer: Self-pay | Admitting: *Deleted

## 2023-07-07 ENCOUNTER — Telehealth: Payer: Self-pay | Admitting: *Deleted

## 2023-07-07 MED ORDER — SEMAGLUTIDE (1 MG/DOSE) 4 MG/3ML ~~LOC~~ SOPN: 1.0000 mg | PEN_INJECTOR | SUBCUTANEOUS | 1 refills | Status: DC

## 2023-07-07 NOTE — Telephone Encounter (Signed)
Rx refills send to pharmacy

## 2023-07-07 NOTE — Telephone Encounter (Signed)
Copied from CRM [id number]. Topic: Clinical - Medication Question >> Jul 07, 2023 11:05 AM Sonny Dandy B wrote: Reason for CRM: pt called to speak with his provider regarding medication that's been denied. Pt is requesting a call back at [phone]   Called patient, stated busy at this time will call back in [facility]

## 2023-07-29 ENCOUNTER — Encounter: Payer: Self-pay | Admitting: Skilled Nursing Facility1

## 2023-07-29 ENCOUNTER — Encounter: Payer: BC Managed Care – PPO | Attending: Family Medicine | Admitting: Skilled Nursing Facility1

## 2023-07-29 DIAGNOSIS — E119 Type 2 diabetes mellitus without complications: Secondary | ICD-10-CM | POA: Diagnosis not present

## 2023-07-29 NOTE — Progress Notes (Signed)
Pt states he walks 3-3.5 miles daily.  Pt states he is always on the road and snacking in his car going from appt to appt.   DM medications: Farxiga Metformin  Semaglutide   Diabetes Self-Management Education  Visit Type: First/Initial  Appt. Start Time: 7:24 Appt. End Time: 8:35  08/04/2023  Mr. Preston Gamble, identified by name and date of birth, is a 63 y.o. male with a diagnosis of Diabetes: Type 2.   ASSESSMENT  There were no vitals taken for this visit. There is no height or weight on file to calculate BMI.   Diabetes Self-Management Education - 07/29/23 0730       Visit Information   Visit Type First/Initial      Initial Visit   Diabetes Type Type 2    Are you taking your medications as prescribed? Yes      Psychosocial Assessment   Patient Belief/Attitude about Diabetes Motivated to manage diabetes    What is the hardest part about your diabetes right now, causing you the most concern, or is the most worrisome to you about your diabetes?   Making healty food and beverage choices    Self-management support Friends;Family    Patient Concerns Nutrition/Meal planning;Healthy Lifestyle    Preferred Learning Style Visual;Hands on    Learning Readiness Not Ready    How often do you need to have someone help you when you read instructions, pamphlets, or other written materials from your doctor or pharmacy? 1 - Never      Pre-Education Assessment   Patient understands the diabetes disease and treatment process. Needs Instruction    Patient understands incorporating nutritional management into lifestyle. Needs Instruction    Patient undertands incorporating physical activity into lifestyle. Needs Instruction    Patient understands using medications safely. Needs Instruction    Patient understands monitoring blood glucose, interpreting and using results Needs Instruction    Patient understands prevention, detection, and treatment of acute complications. Needs  Instruction    Patient understands prevention, detection, and treatment of chronic complications. Needs Instruction    Patient understands how to develop strategies to address psychosocial issues. Needs Instruction    Patient understands how to develop strategies to promote health/change behavior. Needs Instruction      Complications   Last HgB A1C per patient/outside source 8.3 %    How often do you check your blood sugar? 0 times/day (not testing)      Dietary Intake   Breakfast peanut butter sandwich or krispy kreme + gronla    Lunch chic fila or sub    Dinner cereal or mac n cheese and hamburger    Snack (evening) choclaote covered pretzels or peanuts    Beverage(s) diet coke, water      Activity / Exercise   Activity / Exercise Type Light (walking / raking leaves)    How many days per week do you exercise? 3    How many minutes per day do you exercise? 45    Total minutes per week of exercise 135      Patient Education   Previous Diabetes Education No    Disease Pathophysiology Definition of diabetes, type 1 and 2, and the diagnosis of diabetes    Healthy Eating Food label reading, portion sizes and measuring food.;Plate Method;Role of diet in the treatment of diabetes and the relationship between the three main macronutrients and blood glucose level;Information on hints to eating out and maintain blood glucose control.;Meal options for control of blood glucose  level and chronic complications.    Being Active Role of exercise on diabetes management, blood pressure control and cardiac health.;Helped patient identify appropriate exercises in relation to his/her diabetes, diabetes complications and other health issue.    Medications Reviewed patients medication for diabetes, action, purpose, timing of dose and side effects.    Chronic complications Dental care;Retinopathy and reason for yearly dilated eye exams;Nephropathy, what it is, prevention of, the use of ACE, ARB's and early  detection of through urine microalbumia.;Lipid levels, blood glucose control and heart disease;Relationship between chronic complications and blood glucose control    Diabetes Stress and Support Role of stress on diabetes;Brainstormed with patient on coping mechanisms for social situations, getting support from significant others, dealing with feelings about diabetes;Identified and addressed patients feelings and concerns about diabetes    Lifestyle and Health Coping Lifestyle issues that need to be addressed for better diabetes care;Helped patient develop diabetes management plan for (enter comment)      Individualized Goals (developed by patient)   Nutrition General guidelines for healthy choices and portions discussed;Follow meal plan discussed    Physical Activity Exercise 5-7 days per week;45 minutes per day    Monitoring  Test my blood glucose as discussed    Problem Solving Eating Pattern    Reducing Risk do foot checks daily;treat hypoglycemia with 15 grams of carbs if blood glucose less than 70mg /dL      Post-Education Assessment   Patient understands the diabetes disease and treatment process. Comprehends key points    Patient understands incorporating nutritional management into lifestyle. Comprehends key points    Patient undertands incorporating physical activity into lifestyle. Comprehends key points    Patient understands using medications safely. Comphrehends key points    Patient understands monitoring blood glucose, interpreting and using results Comprehends key points    Patient understands prevention, detection, and treatment of acute complications. Comprehends key points    Patient understands prevention, detection, and treatment of chronic complications. Comprehends key points    Patient understands how to develop strategies to address psychosocial issues. Comprehends key points    Patient understands how to develop strategies to promote health/change behavior. Comprehends  key points      Outcomes   Expected Outcomes Demonstrated limited interest in learning.  Expect minimal changes    Future DMSE 3-4 months    Program Status Completed             Individualized Plan for Diabetes Self-Management Training:   Learning Objective:  Patient will have a greater understanding of diabetes self-management. Patient education plan is to attend individual and/or group sessions per assessed needs and concerns.   Expected Outcomes:  Demonstrated limited interest in learning.  Expect minimal changes  Education material provided: ADA - How to Thrive: A Guide for Your Journey with Diabetes, Food label handouts, A1C conversion sheet, Meal plan card, and My Plate  If problems or questions, patient to contact team via:  Phone and Email  Future DSME appointment: 3-4 months

## 2023-07-30 DIAGNOSIS — H2513 Age-related nuclear cataract, bilateral: Secondary | ICD-10-CM | POA: Diagnosis not present

## 2023-07-30 DIAGNOSIS — E119 Type 2 diabetes mellitus without complications: Secondary | ICD-10-CM | POA: Diagnosis not present

## 2023-07-30 LAB — HM DIABETES EYE EXAM

## 2023-08-04 ENCOUNTER — Encounter: Payer: Self-pay | Admitting: Skilled Nursing Facility1

## 2023-08-27 ENCOUNTER — Encounter: Payer: BC Managed Care – PPO | Admitting: Family Medicine

## 2023-09-16 ENCOUNTER — Encounter: Payer: BC Managed Care – PPO | Attending: Family Medicine | Admitting: Skilled Nursing Facility1

## 2023-09-16 ENCOUNTER — Encounter: Payer: Self-pay | Admitting: Skilled Nursing Facility1

## 2023-09-16 NOTE — Progress Notes (Signed)
 Pt states he walks 3-3.5 miles daily.   DM medications: Farxiga Metformin  Semaglutide   Diabetes Self-Management Education  Visit Type:    Appt. Start Time: 7:24 Appt. End Time: 7:35  09/16/2023  Mr. Elim Economou, identified by name and date of birth, is a 63 y.o. male with a diagnosis of Diabetes:  .   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 Pt states he feels he has a lot more work to do with changes: Dietitian encouraged pt that he has made fantastic changes such as not eating doughnuts daily, continuing his daily walks, and limiting how much beef he eats.  Pt states he was due to get his A1C 2 weeks ago but the appt was cancelled so he does not have an appt until May.   Pt states he does not require any education or further discussion stating he knows what he needs to work on.

## 2023-10-17 ENCOUNTER — Other Ambulatory Visit (HOSPITAL_COMMUNITY): Payer: Self-pay

## 2023-10-17 ENCOUNTER — Telehealth: Payer: Self-pay

## 2023-10-17 NOTE — Telephone Encounter (Signed)
 Pharmacy Patient Advocate Encounter  Received notification from [facility] that Prior Authorization for Ozempic 1mg  pen has been APPROVED from 10/17/23 to 10/15/24. Ran test claim, Copay is $977.83. This test claim was processed through [facility]- copay amounts may vary at other pharmacies due to pharmacy/plan contracts, or as the patient moves through the different stages of their insurance plan.   PA #/Case ID/Reference #: --  *patients copay is due to a high deductible

## 2023-10-20 NOTE — Telephone Encounter (Signed)
 Patient was notified.

## 2023-11-11 ENCOUNTER — Ambulatory Visit: Admitting: Family Medicine

## 2023-11-11 ENCOUNTER — Encounter: Payer: Self-pay | Admitting: Family Medicine

## 2023-11-11 VITALS — BP 115/72 | HR 66 | Temp 97.2°F | Ht 74.0 in | Wt 220.0 lb

## 2023-11-11 DIAGNOSIS — N529 Male erectile dysfunction, unspecified: Secondary | ICD-10-CM | POA: Diagnosis not present

## 2023-11-11 DIAGNOSIS — Z23 Encounter for immunization: Secondary | ICD-10-CM

## 2023-11-11 DIAGNOSIS — E1169 Type 2 diabetes mellitus with other specified complication: Secondary | ICD-10-CM

## 2023-11-11 DIAGNOSIS — H9190 Unspecified hearing loss, unspecified ear: Secondary | ICD-10-CM | POA: Diagnosis not present

## 2023-11-11 DIAGNOSIS — E785 Hyperlipidemia, unspecified: Secondary | ICD-10-CM

## 2023-11-11 DIAGNOSIS — Z0000 Encounter for general adult medical examination without abnormal findings: Secondary | ICD-10-CM | POA: Diagnosis not present

## 2023-11-11 DIAGNOSIS — Z0001 Encounter for general adult medical examination with abnormal findings: Secondary | ICD-10-CM

## 2023-11-11 DIAGNOSIS — E119 Type 2 diabetes mellitus without complications: Secondary | ICD-10-CM

## 2023-11-11 DIAGNOSIS — Z1159 Encounter for screening for other viral diseases: Secondary | ICD-10-CM

## 2023-11-11 DIAGNOSIS — Z7984 Long term (current) use of oral hypoglycemic drugs: Secondary | ICD-10-CM

## 2023-11-11 NOTE — Addendum Note (Signed)
 Addended by: Alverda Joe on: 11/11/2023 10:02 AM   Modules accepted: Orders

## 2023-11-11 NOTE — Progress Notes (Signed)
 Chief Complaint:  Preston Gamble is a 63 y.o. male who presents today for his annual comprehensive physical exam.    Assessment/Plan:  Chronic Problems Addressed Today: Diabetes mellitus without complication (HCC) He is working a lot so interventions.  Will check A1c today with labs.  He is currently on Farxiga  10 mg daily, metformin  1000 mg daily, and Ozempic  1 mg weekly.  He currently has a high deductible health plan and is paying quite a bit out-of-pocket for medications.  He may be interested in going through [place] direct for some of these prescriptions though he will let us  know if he would like for us  to change this.  Declined making any changes today.  Gave sample of Farxiga  today.  Dyslipidemia associated with type 2 diabetes mellitus (HCC) Is working on lifestyle interventions.  On Lipitor  80 mg daily as well.  Check lipids.  Erectile dysfunction Stable on Cialis  10 mg daily as needed.  Will refill today.  Decreased hearing Likely age-related presbycusis.  No red flags.  Discussed referral to audiology however he would like to hold off on this for now.  Preventative Healthcare: He will come back for fasting labs.  Prevnar 20 given today.  Up-to-date on Colon cancer screening and other vaccines.  Patient Counseling(The following topics were reviewed and/or handout was given):  -Nutrition: Stressed importance of moderation in sodium/caffeine intake, saturated fat and cholesterol, caloric balance, sufficient intake of fresh fruits, vegetables, and fiber.  -Stressed the importance of regular exercise.   -Substance Abuse: Discussed cessation/primary prevention of tobacco, alcohol, or other drug use; driving or other dangerous activities under the influence; availability of treatment for abuse.   -Injury prevention: Discussed safety belts, safety helmets, smoke detector, smoking near bedding or upholstery.   -Sexuality: Discussed sexually transmitted diseases, partner selection,  use of condoms, avoidance of unintended pregnancy and contraceptive alternatives.   -Dental health: Discussed importance of regular tooth brushing, flossing, and dental visits.  -Health maintenance and immunizations reviewed. Please refer to Health maintenance section.  Return to care in 1 year for next preventative visit.     Subjective:  HPI:  He has no acute complaints today.   Lifestyle Diet: Trying to cut down on sweets and carbohydrates.  Exercise: Walking routinely     11/11/2023    9:23 AM  Depression screen PHQ 2/9  Decreased Interest 0  Down, Depressed, Hopeless 0  PHQ - 2 Score 0    Health Maintenance Due  Topic Date Due   FOOT EXAM  Never done   Hepatitis C Screening  Never done   Pneumococcal Vaccine 28-74 Years old (1 of 2 - PCV) Never done   Diabetic kidney evaluation - Urine ACR  06/19/2023   Diabetic kidney evaluation - eGFR measurement  11/13/2023     ROS: Per HPI, otherwise a complete review of systems was negative.   PMH:  The following were reviewed and entered/updated in epic: Past Medical History:  Diagnosis Date   Acquired dilation of ascending aorta and aortic root (HCC) 05/27/2015   43 mm ascending aorta and 40 mm aortic root by echo 06/2023   Cancer (HCC)    lung cancer- dx 06-2020   Chest pain    due to injury   Diabetes mellitus without complication (HCC)    Family history of early CAD 05/17/2016   Hyperlipidemia    Mitral valve prolapse    Mild MR by echo 06/2023   Umbilical hernia    Patient Active Problem  List   Diagnosis Date Noted   Decreased hearing 11/11/2023   Erectile dysfunction 03/13/2022   Left upper lobe pulmonary nodule 09/06/2020   Stage I Lung cancer [facility]) s/p resection 09/2020 07/25/2020   Family history of early CAD 05/17/2016   MVP (mitral valve prolapse) 05/16/2016   Acquired dilation of ascending aorta and aortic root (HCC) 05/27/2015   Diabetes mellitus without complication (HCC)    Dyslipidemia associated  with type 2 diabetes mellitus [facility])    Past Surgical History:  Procedure Laterality Date   BRONCHIAL BIOPSY  09/06/2020   part of lobe removed (upper) Procedure: BRONCHIAL BIOPSIES;  Surgeon: Prudy Brownie, DO;  Location: [facility];  Service: Pulmonary;;   BRONCHIAL BRUSHINGS  09/06/2020   Procedure: BRONCHIAL BRUSHINGS;  Surgeon: Prudy Brownie, DO;  Location: [facility];  Service: Pulmonary;;   BRONCHIAL NEEDLE ASPIRATION BIOPSY  09/06/2020   Procedure: BRONCHIAL NEEDLE ASPIRATION BIOPSIES;  Surgeon: Prudy Brownie, DO;  Location: [facility];  Service: Pulmonary;;   COLONOSCOPY     HERNIA REPAIR     INTERCOSTAL NERVE BLOCK Left 09/06/2020   Procedure: INTERCOSTAL NERVE BLOCK;  Surgeon: Hilarie Lovely, MD;  Location: [facility] OR;  Service: Thoracic;  Laterality: Left;   NECK SURGERY     PLANTAR FASCIA RELEASE     SCLEROTHERAPY  09/06/2020   Procedure: SCLEROTHERAPY;  Surgeon: Prudy Brownie, DO;  Location: [facility];  Service: Pulmonary;;   SEPTOPLASTY  1994   UMBILICAL HERNIA REPAIR  07/09/2012   Procedure: HERNIA REPAIR UMBILICAL ADULT;  Surgeon: Darcella Earnest, MD;  Location: [facility];  Service: General;  Laterality: N/A;  repair umbilical hernia   VIDEO BRONCHOSCOPY WITH ENDOBRONCHIAL NAVIGATION Left 09/06/2020   Procedure: VIDEO BRONCHOSCOPY WITH ENDOBRONCHIAL NAVIGATION;  Surgeon: Prudy Brownie, DO;  Location: [facility];  Service: Pulmonary;  Laterality: Left;  biopsies and fiducial dye marking, MB + ICG    Family History  Problem Relation Age of Onset   CAD Father    Diabetes Father    Hyperlipidemia Sister    Diabetes Sister    Diabetes Maternal Grandmother    Diabetes Maternal Grandfather    Diabetes Paternal Grandmother    Diabetes Paternal Grandfather    Colon cancer Neg Hx    Pancreatic cancer Neg Hx    Stomach cancer Neg Hx    Esophageal cancer Neg Hx    Rectal cancer Neg Hx     Medications- reviewed and updated Current  Outpatient Medications  Medication Sig Dispense Refill   aspirin  EC 81 MG tablet Take 81 mg by mouth daily.     atorvastatin  (LIPITOR ) 80 MG tablet Take 1 tablet (80 mg total) by mouth daily. 90 tablet 3   cholecalciferol (VITAMIN D3) 25 MCG (1000 UNIT) tablet Take 1,000 Units by mouth daily.     FARXIGA  10 MG TABS tablet TAKE 1 TABLET BY MOUTH EVERY DAY BEFORE BREAKFAST 90 tablet 3   metFORMIN  (GLUCOPHAGE ) 1000 MG tablet TAKE 1 TABLET BY MOUTH EVERY DAY 90 tablet 3   Semaglutide , 1 MG/DOSE, 4 MG/3ML SOPN Inject 1 mg as directed once a week. 3 mL 1   tadalafil  (CIALIS ) 10 MG tablet Take 1 tablet (10 mg total) by mouth daily. 30 tablet 11   No current facility-administered medications for this visit.    Allergies-reviewed and updated No Known Allergies  Social History   Socioeconomic History   Marital status: Married    Spouse name: Not on file  Number of children: Not on file   Years of education: Not on file   Highest education level: Not on file  Occupational History   Not on file  Tobacco Use   Smoking status: Former    Current packs/day: 0.00    Types: Cigarettes    Quit date: 05/20/1997    Years since quitting: 26.4   Smokeless tobacco: Never  Vaping Use   Vaping status: Never Used  Substance and Sexual Activity   Alcohol use: Yes    Alcohol/week: 3.0 standard drinks of alcohol    Types: 3 Cans of beer per week    Comment: occasional   Drug use: No   Sexual activity: Not on file  Other Topics Concern   Not on file  Social History Narrative   Not on file   Social Drivers of Health   Financial Resource Strain: Not on file  Food Insecurity: Not on file  Transportation Needs: Not on file  Physical Activity: Not on file  Stress: Not on file  Social Connections: Not on file        Objective:  Physical Exam: BP 115/72   Pulse 66   Temp (!) 97.2 F (36.2 C) (Temporal)   Ht 6\' 2"  (1.88 m)   Wt 220 lb (99.8 kg)   SpO2 97%   BMI 28.25 kg/m   Body mass  index is 28.25 kg/m. Wt Readings from Last 3 Encounters:  11/11/23 220 lb (99.8 kg)  05/27/23 244 lb 3.2 oz (110.8 kg)  12/26/22 240 lb 6.4 oz (109 kg)   Gen: NAD, resting comfortably HEENT: TMs normal bilaterally. OP clear. No thyromegaly noted.  CV: RRR with no murmurs appreciated Pulm: NWOB, CTAB with no crackles, wheezes, or rhonchi GI: Normal bowel sounds present. Soft, Nontender, Nondistended. MSK: no edema, cyanosis, or clubbing noted Skin: warm, dry Neuro: CN2-12 grossly intact. Strength 5/5 in upper and lower extremities. Reflexes symmetric and intact bilaterally.  Psych: Normal affect and thought content     Melanie Pellot M. Daneil Dunker, MD 11/11/2023 9:58 AM

## 2023-11-11 NOTE — Patient Instructions (Addendum)
 It was very nice to see you today!  We will check blood work today.  Please let us  know if you would like for us  to send a prescription to [facility] direct pharmacy for your medications.  Let us  know if you need referral to see an audiologist.  Please continue to work on diet and exercise.  Please come back next week for labs.  Please continue to work on diet and exercise.  Will see you back in year for your next physical.  We will see you back in 3 to 6 months for follow-up visit depending on your lab results.  Return in about 1 year (around 11/10/2024) for Annual Physical.   Take care, Dr Daneil Dunker  PLEASE NOTE:  If you had any lab tests, please let us  know if you have not heard back within a few days. You may see your results on mychart before we have a chance to review them but we will give you a call once they are reviewed by us .   If we ordered any referrals today, please let us  know if you have not heard from their office within the next week.   If you had any urgent prescriptions sent in today, please check with the pharmacy within an hour of our visit to make sure the prescription was transmitted appropriately.   Please try these tips to maintain a healthy lifestyle:  Eat at least 3 REAL meals and 1-2 snacks per day.  Aim for no more than 5 hours between eating.  If you eat breakfast, please do so within one hour of getting up.   Each meal should contain half fruits/vegetables, one quarter protein, and one quarter carbs (no bigger than a computer mouse)  Cut down on sweet beverages. This includes juice, soda, and sweet tea.   Drink at least 1 glass of water with each meal and aim for at least 8 glasses per day  Exercise at least 150 minutes every week.    Preventive Care 43-10 Years Old, Male Preventive care refers to lifestyle choices and visits with your health care provider that can promote health and wellness. Preventive care visits are also called wellness exams. What  can I expect for my preventive care visit? Counseling During your preventive care visit, your health care provider may ask about your: Medical history, including: Past medical problems. Family medical history. Current health, including: Emotional well-being. Home life and relationship well-being. Sexual activity. Lifestyle, including: Alcohol, nicotine or tobacco, and drug use. Access to firearms. Diet, exercise, and sleep habits. Safety issues such as seatbelt and bike helmet use. Sunscreen use. Work and work Astronomer. Physical exam Your health care provider will check your: Height and weight. These may be used to calculate your BMI (body mass index). BMI is a measurement that tells if you are at a healthy weight. Waist circumference. This measures the distance around your waistline. This measurement also tells if you are at a healthy weight and may help predict your risk of certain diseases, such as type 2 diabetes and high blood pressure. Heart rate and blood pressure. Body temperature. Skin for abnormal spots. What immunizations do I need?  Vaccines are usually given at various ages, according to a schedule. Your health care provider will recommend vaccines for you based on your age, medical history, and lifestyle or other factors, such as travel or where you work. What tests do I need? Screening Your health care provider may recommend screening tests for certain conditions. This may include:  Lipid and cholesterol levels. Diabetes screening. This is done by checking your blood sugar (glucose) after you have not eaten for a while (fasting). Hepatitis B test. Hepatitis C test. HIV (human immunodeficiency virus) test. STI (sexually transmitted infection) testing, if you are at risk. Lung cancer screening. Prostate cancer screening. Colorectal cancer screening. Talk with your health care provider about your test results, treatment options, and if necessary, the need for more  tests. Follow these instructions at home: Eating and drinking  Eat a diet that includes fresh fruits and vegetables, whole grains, lean protein, and low-fat dairy products. Take vitamin and mineral supplements as recommended by your health care provider. Do not drink alcohol if your health care provider tells you not to drink. If you drink alcohol: Limit how much you have to 0-2 drinks a day. Know how much alcohol is in your drink. In the [place] one drink equals one 12 oz bottle of beer (355 mL), one 5 oz glass of wine (148 mL), or one 1 oz glass of hard liquor (44 mL). Lifestyle Brush your teeth every morning and night with fluoride toothpaste. Floss one time each day. Exercise for at least 30 minutes 5 or more days each week. Do not use any products that contain nicotine or tobacco. These products include cigarettes, chewing tobacco, and vaping devices, such as e-cigarettes. If you need help quitting, ask your health care provider. Do not use drugs. If you are sexually active, practice safe sex. Use a condom or other form of protection to prevent STIs. Take aspirin  only as told by your health care provider. Make sure that you understand how much to take and what form to take. Work with your health care provider to find out whether it is safe and beneficial for you to take aspirin  daily. Find healthy ways to manage stress, such as: Meditation, yoga, or listening to music. Journaling. Talking to a trusted person. Spending time with friends and family. Minimize exposure to UV radiation to reduce your risk of skin cancer. Safety Always wear your seat belt while driving or riding in a vehicle. Do not drive: If you have been drinking alcohol. Do not ride with someone who has been drinking. When you are tired or distracted. While texting. If you have been using any mind-altering substances or drugs. Wear a helmet and other protective equipment during sports activities. If you have firearms  in your house, make sure you follow all gun safety procedures. What's next? Go to your health care provider once a year for an annual wellness visit. Ask your health care provider how often you should have your eyes and teeth checked. Stay up to date on all vaccines. This information is not intended to replace advice given to you by your health care provider. Make sure you discuss any questions you have with your health care provider. Document Revised: 12/20/2020 Document Reviewed: 12/20/2020 Elsevier Patient Education  2024 [employer].

## 2023-11-11 NOTE — Assessment & Plan Note (Signed)
 He is working a lot so interventions.  Will check A1c today with labs.  He is currently on Farxiga  10 mg daily, metformin  1000 mg daily, and Ozempic  1 mg weekly.  He currently has a high deductible health plan and is paying quite a bit out-of-pocket for medications.  He may be interested in going through [place] direct for some of these prescriptions though he will let us  know if he would like for us  to change this.  Declined making any changes today.  Gave sample of Farxiga  today.

## 2023-11-11 NOTE — Assessment & Plan Note (Signed)
Stable on Cialis 10 mg daily as needed.  Will refill today.

## 2023-11-11 NOTE — Assessment & Plan Note (Signed)
 Is working on lifestyle interventions.  On Lipitor  80 mg daily as well.  Check lipids.

## 2023-11-11 NOTE — Assessment & Plan Note (Signed)
 Likely age-related presbycusis.  No red flags.  Discussed referral to audiology however he would like to hold off on this for now.

## 2023-11-13 ENCOUNTER — Other Ambulatory Visit (INDEPENDENT_AMBULATORY_CARE_PROVIDER_SITE_OTHER)

## 2023-11-13 DIAGNOSIS — Z0001 Encounter for general adult medical examination with abnormal findings: Secondary | ICD-10-CM | POA: Diagnosis not present

## 2023-11-13 DIAGNOSIS — E785 Hyperlipidemia, unspecified: Secondary | ICD-10-CM

## 2023-11-13 DIAGNOSIS — Z1159 Encounter for screening for other viral diseases: Secondary | ICD-10-CM | POA: Diagnosis not present

## 2023-11-13 DIAGNOSIS — E119 Type 2 diabetes mellitus without complications: Secondary | ICD-10-CM

## 2023-11-13 DIAGNOSIS — E1169 Type 2 diabetes mellitus with other specified complication: Secondary | ICD-10-CM | POA: Diagnosis not present

## 2023-11-13 LAB — LIPID PANEL
Cholesterol: 108 mg/dL (ref 0–200)
HDL: 37.9 mg/dL — ABNORMAL LOW (ref >39.00)
LDL Cholesterol: 46 mg/dL (ref 0–99)
NonHDL: 70.59
Total CHOL/HDL Ratio: 3
Triglycerides: 122 mg/dL (ref 0.0–149.0)
VLDL: 24.4 mg/dL (ref 0.0–40.0)

## 2023-11-13 LAB — CBC
HCT: 48.8 % (ref 39.0–52.0)
Hemoglobin: 16.6 g/dL (ref 13.0–17.0)
MCHC: 34.1 g/dL (ref 30.0–36.0)
MCV: 84.9 fl (ref 78.0–100.0)
Platelets: 226 10*3/uL (ref 150.0–400.0)
RBC: 5.75 Mil/uL (ref 4.22–5.81)
RDW: 13.6 % (ref 11.5–15.5)
WBC: 6.8 10*3/uL (ref 4.0–10.5)

## 2023-11-13 LAB — COMPREHENSIVE METABOLIC PANEL WITH GFR
ALT: 31 U/L (ref 0–53)
AST: 20 U/L (ref 0–37)
Albumin: 4.4 g/dL (ref 3.5–5.2)
Alkaline Phosphatase: 91 U/L (ref 39–117)
BUN: 16 mg/dL (ref 6–23)
CO2: 22 meq/L (ref 19–32)
Calcium: 9 mg/dL (ref 8.4–10.5)
Chloride: 105 meq/L (ref 96–112)
Creatinine, Ser: 1 mg/dL (ref 0.40–1.50)
GFR: 81.65 mL/min (ref >60.00)
Glucose, Bld: 165 mg/dL — ABNORMAL HIGH (ref 70–99)
Potassium: 4 meq/L (ref 3.5–5.1)
Sodium: 136 meq/L (ref 135–145)
Total Bilirubin: 1.5 mg/dL — ABNORMAL HIGH (ref 0.2–1.2)
Total Protein: 6.6 g/dL (ref 6.0–8.3)

## 2023-11-13 LAB — MICROALBUMIN / CREATININE URINE RATIO
Creatinine,U: 173.5 mg/dL
Microalb Creat Ratio: 17 mg/g (ref 0.0–30.0)
Microalb, Ur: 2.9 mg/dL — ABNORMAL HIGH (ref 0.0–1.9)

## 2023-11-13 LAB — HEMOGLOBIN A1C: Hgb A1c MFr Bld: 8.1 % — ABNORMAL HIGH (ref 4.6–6.5)

## 2023-11-13 LAB — TSH: TSH: 1.42 u[IU]/mL (ref 0.35–5.50)

## 2023-11-13 LAB — VITAMIN B12: Vitamin B-12: 163 pg/mL — ABNORMAL LOW (ref 211–911)

## 2023-11-14 LAB — HEPATITIS C ANTIBODY: Hepatitis C Ab: NONREACTIVE

## 2023-11-17 ENCOUNTER — Inpatient Hospital Stay: Payer: BC Managed Care – PPO | Attending: Internal Medicine

## 2023-11-17 ENCOUNTER — Ambulatory Visit (HOSPITAL_COMMUNITY)
Admission: RE | Admit: 2023-11-17 | Discharge: 2023-11-17 | Disposition: A | Discharge status: Home / Self Care | Source: Ambulatory Visit | Attending: Internal Medicine | Admitting: Internal Medicine

## 2023-11-17 ENCOUNTER — Encounter: Payer: Self-pay | Admitting: Family Medicine

## 2023-11-17 DIAGNOSIS — C3490 Malignant neoplasm of unspecified part of unspecified bronchus or lung: Secondary | ICD-10-CM | POA: Insufficient documentation

## 2023-11-17 DIAGNOSIS — Z85118 Personal history of other malignant neoplasm of bronchus and lung: Secondary | ICD-10-CM | POA: Insufficient documentation

## 2023-11-17 DIAGNOSIS — I700 Atherosclerosis of aorta: Secondary | ICD-10-CM | POA: Diagnosis not present

## 2023-11-17 DIAGNOSIS — Z87891 Personal history of nicotine dependence: Secondary | ICD-10-CM | POA: Insufficient documentation

## 2023-11-17 DIAGNOSIS — Z902 Acquired absence of lung [part of]: Secondary | ICD-10-CM | POA: Insufficient documentation

## 2023-11-17 DIAGNOSIS — I2510 Atherosclerotic heart disease of native coronary artery without angina pectoris: Secondary | ICD-10-CM | POA: Insufficient documentation

## 2023-11-17 DIAGNOSIS — Z8249 Family history of ischemic heart disease and other diseases of the circulatory system: Secondary | ICD-10-CM | POA: Insufficient documentation

## 2023-11-17 LAB — CMP (CANCER CENTER ONLY)
ALT: 26 U/L (ref 0–44)
AST: 18 U/L (ref 15–41)
Albumin: 4.5 g/dL (ref 3.5–5.0)
Alkaline Phosphatase: 91 U/L (ref 38–126)
Anion gap: 5 (ref 5–15)
BUN: 14 mg/dL (ref 6–20)
CO2: 27 mmol/L (ref 22–32)
Calcium: 9.1 mg/dL (ref 8.9–10.3)
Chloride: 105 mmol/L (ref 98–111)
Creatinine: 1.01 mg/dL (ref 0.61–1.24)
GFR, Estimated: >60 mL/min (ref >60)
Glucose, Bld: 149 mg/dL — ABNORMAL HIGH (ref 70–99)
Potassium: 4 mmol/L (ref 3.5–5.1)
Sodium: 137 mmol/L (ref 135–145)
Total Bilirubin: 1.7 mg/dL — ABNORMAL HIGH (ref 0.0–1.2)
Total Protein: 6.7 g/dL (ref 6.5–8.1)

## 2023-11-17 LAB — CBC WITH DIFFERENTIAL (CANCER CENTER ONLY)
Abs Immature Granulocytes: 0.01 10*3/uL (ref 0.00–0.07)
Basophils Absolute: 0.1 10*3/uL (ref 0.0–0.1)
Basophils Relative: 1 %
Eosinophils Absolute: 0.3 10*3/uL (ref 0.0–0.5)
Eosinophils Relative: 6 %
HCT: 47.2 % (ref 39.0–52.0)
Hemoglobin: 17.1 g/dL — ABNORMAL HIGH (ref 13.0–17.0)
Immature Granulocytes: 0 %
Lymphocytes Relative: 19 %
Lymphs Abs: 1 10*3/uL (ref 0.7–4.0)
MCH: 29.5 pg (ref 26.0–34.0)
MCHC: 36.2 g/dL — ABNORMAL HIGH (ref 30.0–36.0)
MCV: 81.4 fL (ref 80.0–100.0)
Monocytes Absolute: 0.4 10*3/uL (ref 0.1–1.0)
Monocytes Relative: 7 %
Neutro Abs: 3.5 10*3/uL (ref 1.7–7.7)
Neutrophils Relative %: 67 %
Platelet Count: 217 10*3/uL (ref 150–400)
RBC: 5.8 MIL/uL (ref 4.22–5.81)
RDW: 12.6 % (ref 11.5–15.5)
WBC Count: 5.3 10*3/uL (ref 4.0–10.5)
nRBC: 0 % (ref 0.0–0.2)

## 2023-11-17 MED ORDER — IOHEXOL 300 MG/ML  SOLN
75.0000 mL | Freq: Once | INTRAMUSCULAR | Status: AC | PRN
  Administered 2023-11-17: 75 mL via INTRAVENOUS

## 2023-11-17 MED ORDER — SODIUM CHLORIDE (PF) 0.9 % IJ SOLN
INTRAMUSCULAR | Status: AC
  Filled 2023-11-17: qty 50

## 2023-11-17 NOTE — Progress Notes (Signed)
 A1c is 8.1.  This is stable to his previous values though we should work on getting this a bit lower.  Recommend we increase his Ozempic  to 2 mg weekly.  Please send in prescription if he is agreeable.  We should recheck his A1c in 3 months or so.  His B12 is low.  Recommend he start B12 replacement protocol here.  We should recheck in 3 months.  All this other labs are stable and we can recheck in a year.

## 2023-11-18 ENCOUNTER — Ambulatory Visit: Payer: Self-pay | Admitting: *Deleted

## 2023-11-18 ENCOUNTER — Other Ambulatory Visit: Payer: Self-pay | Admitting: *Deleted

## 2023-11-18 MED ORDER — SEMAGLUTIDE (2 MG/DOSE) 8 MG/3ML ~~LOC~~ SOPN: 2.0000 mg | PEN_INJECTOR | SUBCUTANEOUS | 1 refills | Status: DC

## 2023-11-18 NOTE — Telephone Encounter (Signed)
 Spoke with patient aware Rx Ozempic  was send to pharmacy  B12 nurse visit schedule

## 2023-11-19 ENCOUNTER — Ambulatory Visit (INDEPENDENT_AMBULATORY_CARE_PROVIDER_SITE_OTHER): Admitting: *Deleted

## 2023-11-19 ENCOUNTER — Encounter: Payer: Self-pay | Admitting: Cardiology

## 2023-11-19 ENCOUNTER — Inpatient Hospital Stay (HOSPITAL_BASED_OUTPATIENT_CLINIC_OR_DEPARTMENT_OTHER): Payer: BC Managed Care – PPO | Admitting: Internal Medicine

## 2023-11-19 VITALS — BP 136/66 | HR 64 | Temp 97.5°F | Resp 17 | Ht 74.0 in | Wt 244.5 lb

## 2023-11-19 DIAGNOSIS — Z8249 Family history of ischemic heart disease and other diseases of the circulatory system: Secondary | ICD-10-CM | POA: Diagnosis not present

## 2023-11-19 DIAGNOSIS — Z87891 Personal history of nicotine dependence: Secondary | ICD-10-CM | POA: Diagnosis not present

## 2023-11-19 DIAGNOSIS — I2510 Atherosclerotic heart disease of native coronary artery without angina pectoris: Secondary | ICD-10-CM | POA: Diagnosis not present

## 2023-11-19 DIAGNOSIS — E538 Deficiency of other specified B group vitamins: Secondary | ICD-10-CM

## 2023-11-19 DIAGNOSIS — C3490 Malignant neoplasm of unspecified part of unspecified bronchus or lung: Secondary | ICD-10-CM

## 2023-11-19 DIAGNOSIS — Z902 Acquired absence of lung [part of]: Secondary | ICD-10-CM | POA: Diagnosis not present

## 2023-11-19 MED ORDER — CYANOCOBALAMIN 1000 MCG/ML IJ SOLN
1000.0000 ug | Freq: Once | INTRAMUSCULAR | Status: AC
  Administered 2023-11-19: 1000 ug via INTRAMUSCULAR

## 2023-11-19 NOTE — Progress Notes (Signed)
 [facility] [phone]   [phone]  OFFICE PROGRESS NOTE  Rodney Clamp, MD [address] [place] [address]  DIAGNOSIS: Stage IA (T1b, N0, M0) non-small cell lung cancer, adenocarcinoma, acinar predominant diagnosed in March 2022  PRIOR THERAPY: Status post left upper lobe wedge resection under the care of Dr. Deloise Ferries on September 06, 2020.  CURRENT THERAPY: Observation.  INTERVAL HISTORY: Preston Gamble 63 y.o. male returns to the clinic today for annual follow-up visit.Discussed the use of AI scribe software for clinical note transcription with the patient, who gave verbal consent to proceed.  History of Present Illness   Preston Gamble "Preston Gamble" is a 63 year old male with stage Ia non-small cell lung cancer, adenocarcinoma, who presents for evaluation and repeat CT scan of the chest for restaging of his disease.  He was diagnosed with stage Ia non-small cell lung cancer, adenocarcinoma, in March 2022 and underwent a left upper lobe wedge resection. He is currently under observation and is here for a repeat CT scan of the chest to restage his disease.  He has no new symptoms and no shortness of breath with exertion. He maintains a routine of walking four miles a day, including uphill, and finds uphill walking challenging but manageable.  He is on cholesterol medication, although the specific medication, dose, and frequency were not discussed. Coronary artery calcification was found incidentally on a previous scan.       MEDICAL HISTORY: Past Medical History:  Diagnosis Date   Acquired dilation of ascending aorta and aortic root (HCC) 05/27/2015   43 mm ascending aorta and 40 mm aortic root by echo 06/2023   Cancer (HCC)    lung cancer- dx 06-2020   Chest pain    due to injury   Diabetes mellitus without complication [facility])    Family history of early CAD 05/17/2016   Hyperlipidemia    Mitral valve prolapse    Mild MR by  echo 06/2023   Umbilical hernia     ALLERGIES:  has no known allergies.  MEDICATIONS:  Current Outpatient Medications  Medication Sig Dispense Refill   aspirin  EC 81 MG tablet Take 81 mg by mouth daily.     atorvastatin  (LIPITOR ) 80 MG tablet Take 1 tablet (80 mg total) by mouth daily. 90 tablet 3   cholecalciferol (VITAMIN D3) 25 MCG (1000 UNIT) tablet Take 1,000 Units by mouth daily.     FARXIGA  10 MG TABS tablet TAKE 1 TABLET BY MOUTH EVERY DAY BEFORE BREAKFAST 90 tablet 3   metFORMIN  (GLUCOPHAGE ) 1000 MG tablet TAKE 1 TABLET BY MOUTH EVERY DAY 90 tablet 3   Semaglutide , 2 MG/DOSE, 8 MG/3ML SOPN Inject 2 mg as directed once a week. 3 mL 1   tadalafil  (CIALIS ) 10 MG tablet Take 1 tablet (10 mg total) by mouth daily. 30 tablet 11   No current facility-administered medications for this visit.    SURGICAL HISTORY:  Past Surgical History:  Procedure Laterality Date   BRONCHIAL BIOPSY  09/06/2020   part of lobe removed (upper) Procedure: BRONCHIAL BIOPSIES;  Surgeon: Prudy Brownie, DO;  Location: [facility];  Service: Pulmonary;;   BRONCHIAL BRUSHINGS  09/06/2020   Procedure: BRONCHIAL BRUSHINGS;  Surgeon: Prudy Brownie, DO;  Location: [facility];  Service: Pulmonary;;   BRONCHIAL NEEDLE ASPIRATION BIOPSY  09/06/2020   Procedure: BRONCHIAL NEEDLE ASPIRATION BIOPSIES;  Surgeon: Prudy Brownie, DO;  Location: [facility];  Service: Pulmonary;;  COLONOSCOPY     HERNIA REPAIR     INTERCOSTAL NERVE BLOCK Left 09/06/2020   Procedure: INTERCOSTAL NERVE BLOCK;  Surgeon: Hilarie Lovely, MD;  Location: [facility];  Service: Thoracic;  Laterality: Left;   NECK SURGERY     PLANTAR FASCIA RELEASE     SCLEROTHERAPY  09/06/2020   Procedure: SCLEROTHERAPY;  Surgeon: Prudy Brownie, DO;  Location: [facility];  Service: Pulmonary;;   SEPTOPLASTY  1994   UMBILICAL HERNIA REPAIR  07/09/2012   Procedure: HERNIA REPAIR UMBILICAL ADULT;  Surgeon: Darcella Earnest, MD;  Location: [facility];  Service: General;  Laterality: N/A;  repair umbilical hernia   VIDEO BRONCHOSCOPY WITH ENDOBRONCHIAL NAVIGATION Left 09/06/2020   Procedure: VIDEO BRONCHOSCOPY WITH ENDOBRONCHIAL NAVIGATION;  Surgeon: Prudy Brownie, DO;  Location: [facility];  Service: Pulmonary;  Laterality: Left;  biopsies and fiducial dye marking, MB + ICG    REVIEW OF SYSTEMS:  A comprehensive review of systems was negative except for: Respiratory: positive for dyspnea on exertion   PHYSICAL EXAMINATION: General appearance: alert, cooperative, and no distress Head: Normocephalic, without obvious abnormality, atraumatic Neck: no adenopathy, no JVD, supple, symmetrical, trachea midline, and thyroid  not enlarged, symmetric, no tenderness/mass/nodules Lymph nodes: Cervical, supraclavicular, and axillary nodes normal. Resp: clear to auscultation bilaterally Back: symmetric, no curvature. ROM normal. No CVA tenderness. Cardio: regular rate and rhythm, S1, S2 normal, no murmur, click, rub or gallop GI: soft, non-tender; bowel sounds normal; no masses,  no organomegaly Extremities: extremities normal, atraumatic, no cyanosis or edema  ECOG PERFORMANCE STATUS: 0 - Asymptomatic  Blood pressure 136/66, pulse 64, temperature (!) 97.5 F (36.4 C), temperature source Temporal, resp. rate 17, height 6\' 2"  (1.88 m), weight 244 lb 8 oz (110.9 kg), SpO2 98%.  LABORATORY DATA: Lab Results  Component Value Date   WBC 5.3 11/17/2023   HGB 17.1 (H) 11/17/2023   HCT 47.2 11/17/2023   MCV 81.4 11/17/2023   PLT 217 11/17/2023      Chemistry      Component Value Date/Time   NA 137 11/17/2023 0918   NA 139 11/20/2020 0000   K 4.0 11/17/2023 0918   CL 105 11/17/2023 0918   CO2 27 11/17/2023 0918   BUN 14 11/17/2023 0918   BUN 15 11/20/2020 0000   CREATININE 1.01 11/17/2023 0918   CREATININE 1.08 06/05/2015 1120   GLU 188 11/20/2020 0000      Component Value Date/Time   CALCIUM  9.1 11/17/2023 0918   ALKPHOS  91 11/17/2023 0918   AST 18 11/17/2023 0918   ALT 26 11/17/2023 0918   BILITOT 1.7 (H) 11/17/2023 0918       RADIOGRAPHIC STUDIES: CT Chest W Contrast Result Date: 11/18/2023 CLINICAL DATA:  Restaging non-small-cell lung cancer status post left upper lobectomy. * Tracking Code: BO * EXAM: CT CHEST WITH CONTRAST TECHNIQUE: Multidetector CT imaging of the chest was performed during intravenous contrast administration. RADIATION DOSE REDUCTION: This exam was performed according to the departmental dose-optimization program which includes automated exposure control, adjustment of the mA and/or kV according to patient size and/or use of iterative reconstruction technique. CONTRAST:  75mL OMNIPAQUE  IOHEXOL  300 MG/ML  SOLN COMPARISON:  CT chest dated 11/13/2022, MR abdomen dated 06/10/2015 FINDINGS: Cardiovascular: Normal heart size. No significant pericardial fluid/thickening. Great vessels are normal in course and caliber. No central pulmonary emboli. Coronary artery calcifications and aortic atherosclerosis. Mediastinum/Nodes: Imaged thyroid  gland without nodules meeting criteria for imaging follow-up by size. Normal esophagus.  No pathologically enlarged axillary, supraclavicular, mediastinal, or hilar lymph nodes. Lungs/Pleura: The central airways are patent. Postsurgical changes of left upper lobectomy. No focal consolidation. No pneumothorax. No pleural effusion. Upper abdomen: Spleen measures 13.8 cm, normal for patient height. 4.4 cm hypoattenuating posterior hepatic segment 7 lesion (2:145), previously characterized as a venous malformation. Musculoskeletal: No acute or abnormal lytic or blastic osseous lesions. Cervical spinal fixation hardware appears intact. Multilevel degenerative changes of the thoracic spine. IMPRESSION: 1. Postsurgical changes of left upper lobectomy. No evidence of recurrent or metastatic disease in the chest. 2. Aortic Atherosclerosis (ICD10-I70.0). Coronary artery  calcifications. Assessment for potential risk factor modification, dietary therapy or pharmacologic therapy may be warranted, if clinically indicated. Electronically Signed   By: Limin  Xu M.D.   On: 11/18/2023 19:30    ASSESSMENT AND PLAN: This is a very pleasant 63 years old white male with a stage IA (T1b, N0, M0) non-small cell lung cancer, adenocarcinoma, acinar predominant diagnosed in March 2022 status post left upper lobe wedge resection under the care of Dr. Deloise Ferries on September 06, 2020. The patient is currently on observation. He has no concerning complaints except for shortness of breath with exertion especially walking uphill. He had repeat CT scan of the chest performed recently.  I personally and independently reviewed the scan and discussed results with the patient today.  His scan showed no concerning findings for disease recurrence.    Stage Ia non-small cell lung cancer, adenocarcinoma Status post left upper lobe wedge resection with no evidence of recurrence on recent CT scan. Disease is well-managed and he is asymptomatic post-surgery. - Schedule follow-up in one year for re-evaluation. - Advise to contact if any concerning symptoms arise.  Coronary artery calcification Incidental coronary artery calcification on CT scan, likely age-related, with no symptoms such as exertional dyspnea. Under Dr. Charl Concha care for cardiac evaluation and on cholesterol medication. - Instruct to contact Dr. Micael Adas to review CT scan findings and determine if further cardiac evaluation is needed. - Consider dietary modifications and potential stress test as advised by Dr. Micael Adas.   The patient was advised to call immediately if he has any concerning symptoms in the interval. The patient voices understanding of current disease status and treatment options and is in agreement with the current care plan.  All questions were answered. The patient knows to call the clinic with any problems, questions or  concerns. We can certainly see the patient much sooner if necessary. The total time spent in the appointment was 20 minutes.  Disclaimer: This note was dictated with voice recognition software. Similar sounding words can inadvertently be transcribed and may not be corrected upon review.

## 2023-11-19 NOTE — Progress Notes (Signed)
 Patient presents for B12 injection today. Patient received her B12 injection in Right Deltoid. Patient tolerated injection well.  Documentation entered in [facility] in EpicCare.

## 2023-11-25 ENCOUNTER — Ambulatory Visit

## 2023-11-27 ENCOUNTER — Ambulatory Visit (INDEPENDENT_AMBULATORY_CARE_PROVIDER_SITE_OTHER): Admitting: *Deleted

## 2023-11-27 DIAGNOSIS — H903 Sensorineural hearing loss, bilateral: Secondary | ICD-10-CM | POA: Diagnosis not present

## 2023-11-27 DIAGNOSIS — E538 Deficiency of other specified B group vitamins: Secondary | ICD-10-CM | POA: Diagnosis not present

## 2023-11-27 MED ORDER — CYANOCOBALAMIN 1000 MCG/ML IJ SOLN
1000.0000 ug | Freq: Once | INTRAMUSCULAR | Status: AC
  Administered 2023-11-27: 1000 ug via INTRAMUSCULAR

## 2023-11-27 NOTE — Progress Notes (Signed)
 Patient presents for B12 injection today. Patient received her B12 injection in Right Deltoid. Patient tolerated injection well.  Documentation entered in [facility] in EpicCare.

## 2023-12-02 ENCOUNTER — Other Ambulatory Visit (HOSPITAL_BASED_OUTPATIENT_CLINIC_OR_DEPARTMENT_OTHER): Payer: Self-pay

## 2023-12-02 ENCOUNTER — Ambulatory Visit (INDEPENDENT_AMBULATORY_CARE_PROVIDER_SITE_OTHER): Admitting: *Deleted

## 2023-12-02 DIAGNOSIS — E538 Deficiency of other specified B group vitamins: Secondary | ICD-10-CM

## 2023-12-02 MED ORDER — SEMAGLUTIDE (2 MG/DOSE) 8 MG/3ML ~~LOC~~ SOPN
2.0000 mg | PEN_INJECTOR | SUBCUTANEOUS | 1 refills | Status: DC
  Filled 2023-12-02: qty 3, 28d supply, fill #0
  Filled 2024-01-22: qty 3, 28d supply, fill #1

## 2023-12-02 MED ORDER — CYANOCOBALAMIN 1000 MCG/ML IJ SOLN
1000.0000 ug | Freq: Once | INTRAMUSCULAR | Status: AC
  Administered 2023-12-02: 1000 ug via INTRAMUSCULAR

## 2023-12-02 NOTE — Progress Notes (Signed)
 Patient presents for B12 injection today. Patient received her B12 injection in Left Deltoid. Patient tolerated injection well.  Documentation entered in [facility] in EpicCare.

## 2023-12-09 ENCOUNTER — Ambulatory Visit (INDEPENDENT_AMBULATORY_CARE_PROVIDER_SITE_OTHER): Admitting: *Deleted

## 2023-12-09 DIAGNOSIS — E538 Deficiency of other specified B group vitamins: Secondary | ICD-10-CM

## 2023-12-09 MED ORDER — CYANOCOBALAMIN 1000 MCG/ML IJ SOLN
1000.0000 ug | Freq: Once | INTRAMUSCULAR | Status: AC
  Administered 2023-12-09: 1000 ug via INTRAMUSCULAR

## 2023-12-09 NOTE — Progress Notes (Signed)
 Patient presents for B12 injection today. Patient received her B12 injection in Right Deltoid. Patient tolerated injection well.  Documentation entered in [facility] in EpicCare.

## 2024-01-13 ENCOUNTER — Ambulatory Visit

## 2024-01-22 ENCOUNTER — Other Ambulatory Visit (HOSPITAL_BASED_OUTPATIENT_CLINIC_OR_DEPARTMENT_OTHER): Payer: Self-pay

## 2024-01-22 ENCOUNTER — Ambulatory Visit (INDEPENDENT_AMBULATORY_CARE_PROVIDER_SITE_OTHER)

## 2024-01-22 DIAGNOSIS — E538 Deficiency of other specified B group vitamins: Secondary | ICD-10-CM | POA: Diagnosis not present

## 2024-01-22 MED ORDER — CYANOCOBALAMIN 1000 MCG/ML IJ SOLN
1000.0000 ug | Freq: Once | INTRAMUSCULAR | Status: AC
  Administered 2024-01-22: 1000 ug via INTRAMUSCULAR

## 2024-01-22 NOTE — Progress Notes (Signed)
 Patient is in office today for a nurse visit for B12 Injection. Patient Injection was given in the  Right deltoid. Patient tolerated injection well. Next appt 02/10/2024.

## 2024-01-23 ENCOUNTER — Other Ambulatory Visit (HOSPITAL_BASED_OUTPATIENT_CLINIC_OR_DEPARTMENT_OTHER): Payer: Self-pay

## 2024-02-06 ENCOUNTER — Telehealth: Payer: Self-pay | Admitting: *Deleted

## 2024-02-06 ENCOUNTER — Other Ambulatory Visit: Payer: Self-pay | Admitting: *Deleted

## 2024-02-06 ENCOUNTER — Other Ambulatory Visit (HOSPITAL_BASED_OUTPATIENT_CLINIC_OR_DEPARTMENT_OTHER): Payer: Self-pay

## 2024-02-06 MED ORDER — FARXIGA 10 MG PO TABS
10.0000 mg | ORAL_TABLET | Freq: Every day | ORAL | 3 refills | Status: AC
  Filled 2024-02-06: qty 30, 30d supply, fill #0
  Filled 2024-02-06: qty 90, 90d supply, fill #0
  Filled 2024-03-15: qty 30, 30d supply, fill #1
  Filled 2024-03-26 – 2024-04-09 (×2): qty 30, 30d supply, fill #2
  Filled 2024-05-09: qty 30, 30d supply, fill #3
  Filled 2024-05-31 – 2024-06-07 (×2): qty 30, 30d supply, fill #4
  Filled 2024-07-06: qty 30, 30d supply, fill #5

## 2024-02-06 NOTE — Telephone Encounter (Signed)
 Copied from CRM #[id number]. Topic: Clinical - Medication Question >> Feb 06, 2024  8:54 AM Preston Gamble wrote: Reason for CRM: Patient would like to have prescription FARXIGA  10 MG TABS tablet sent to  [facility]  Phone: [phone] Fax: [phone]   .Patient stated that prescription is cheaper there.   Rx send to [facility]

## 2024-02-09 ENCOUNTER — Other Ambulatory Visit (HOSPITAL_BASED_OUTPATIENT_CLINIC_OR_DEPARTMENT_OTHER): Payer: Self-pay

## 2024-02-10 ENCOUNTER — Ambulatory Visit (INDEPENDENT_AMBULATORY_CARE_PROVIDER_SITE_OTHER)

## 2024-02-10 DIAGNOSIS — E538 Deficiency of other specified B group vitamins: Secondary | ICD-10-CM

## 2024-02-10 MED ORDER — CYANOCOBALAMIN 1000 MCG/ML IJ SOLN
1000.0000 ug | Freq: Once | INTRAMUSCULAR | Status: AC
  Administered 2024-02-10: 1000 ug via INTRAMUSCULAR

## 2024-02-10 NOTE — Progress Notes (Signed)
 Patient is in office today for a nurse visit for B12 Injection, per PCP's order. Patient Injection was given in the  Right deltoid. Patient tolerated injection well.

## 2024-02-19 ENCOUNTER — Telehealth: Payer: Self-pay | Admitting: *Deleted

## 2024-02-19 NOTE — Telephone Encounter (Signed)
 Copied from CRM #[id number]. Topic: Clinical - Request for Lab/Test Order >> Feb 19, 2024 10:49 AM Dedra B wrote: Reason for CRM: Pt said he received a call saying that he had an appt for labs at [facility]. The appt was cancelled and he is unsure why the appt was scheduled for [facility]. He said that he thinks he needs labs done in September and would like to speak with the nurse regarding this matter.

## 2024-02-20 ENCOUNTER — Other Ambulatory Visit: Payer: Self-pay | Admitting: Family Medicine

## 2024-02-20 ENCOUNTER — Other Ambulatory Visit (HOSPITAL_BASED_OUTPATIENT_CLINIC_OR_DEPARTMENT_OTHER): Payer: Self-pay

## 2024-02-20 MED ORDER — OZEMPIC (2 MG/DOSE) 8 MG/3ML ~~LOC~~ SOPN
2.0000 mg | PEN_INJECTOR | SUBCUTANEOUS | 1 refills | Status: DC
  Filled 2024-02-20: qty 3, 28d supply, fill #0
  Filled 2024-03-15: qty 3, 28d supply, fill #1

## 2024-03-03 ENCOUNTER — Ambulatory Visit (INDEPENDENT_AMBULATORY_CARE_PROVIDER_SITE_OTHER): Admitting: Family Medicine

## 2024-03-03 VITALS — BP 110/74 | HR 64 | Temp 97.3°F | Ht 74.4 in | Wt 232.0 lb

## 2024-03-03 DIAGNOSIS — E119 Type 2 diabetes mellitus without complications: Secondary | ICD-10-CM

## 2024-03-03 DIAGNOSIS — Z7985 Long-term (current) use of injectable non-insulin antidiabetic drugs: Secondary | ICD-10-CM

## 2024-03-03 DIAGNOSIS — Z7984 Long term (current) use of oral hypoglycemic drugs: Secondary | ICD-10-CM | POA: Diagnosis not present

## 2024-03-03 DIAGNOSIS — M545 Low back pain, unspecified: Secondary | ICD-10-CM | POA: Diagnosis not present

## 2024-03-03 DIAGNOSIS — N529 Male erectile dysfunction, unspecified: Secondary | ICD-10-CM | POA: Diagnosis not present

## 2024-03-03 MED ORDER — TADALAFIL 10 MG PO TABS: 10.0000 mg | ORAL_TABLET | Freq: Every day | ORAL | 11 refills | Status: DC

## 2024-03-03 MED ORDER — CYCLOBENZAPRINE HCL 10 MG PO TABS: 10.0000 mg | ORAL_TABLET | Freq: Three times a day (TID) | ORAL | 0 refills | Status: DC | PRN

## 2024-03-03 MED ORDER — MELOXICAM 15 MG PO TABS: 15.0000 mg | ORAL_TABLET | Freq: Every day | ORAL | 0 refills | Status: DC

## 2024-03-03 NOTE — Patient Instructions (Addendum)
 It was very nice to see you today!  VISIT SUMMARY: Today, you were seen for worsening lower back pain that started about a month ago after moving your son out of school. We also discussed your ongoing treatment for erectile dysfunction.  YOUR PLAN: LOW BACK MUSCLE STRAIN: You have an acute low back muscle strain likely from moving heavy objects, with pain and muscle spasm that has worsened over the past month. -Start taking Flexeril  (cyclobenzaprine ) three times daily as needed, preferably at night. -Take meloxicam  daily for two weeks. -Follow the stretching exercises handout provided, focusing on knee-to-chest stretches. -Use a heating pad for relief. -Avoid activities like golf that exacerbate pain until improvement. -We will discuss potential further interventions if there is no improvement.  ERECTILE DYSFUNCTION: We discussed your ongoing treatment for erectile dysfunction. -Refill Cialis  prescription.  Return if symptoms worsen or fail to improve.   Take care, Dr Kennyth  PLEASE NOTE:  If you had any lab tests, please let us  know if you have not heard back within a few days. You may see your results on mychart before we have a chance to review them but we will give you a call once they are reviewed by us .   If we ordered any referrals today, please let us  know if you have not heard from their office within the next week.   If you had any urgent prescriptions sent in today, please check with the pharmacy within an hour of our visit to make sure the prescription was transmitted appropriately.   Please try these tips to maintain a healthy lifestyle:  Eat at least 3 REAL meals and 1-2 snacks per day.  Aim for no more than 5 hours between eating.  If you eat breakfast, please do so within one hour of getting up.   Each meal should contain half fruits/vegetables, one quarter protein, and one quarter carbs (no bigger than a computer mouse)  Cut down on sweet beverages. This includes  juice, soda, and sweet tea.   Drink at least 1 glass of water with each meal and aim for at least 8 glasses per day  Exercise at least 150 minutes every week.

## 2024-03-03 NOTE — Assessment & Plan Note (Signed)
 He is currently on Farxiga  10 mg daily, metformin  1000 mg daily, and Ozempic  2 mg weekly.  Last A1c was 8.1 however this was before we increase the dose of Ozempic .  He will be coming back in a couple of weeks to recheck A1c.

## 2024-03-03 NOTE — Progress Notes (Signed)
   Preston Gamble is a 63 y.o. male who presents today for an office visit.  Assessment/Plan:  New/Acute Problems: Low Back Pain  No red flags.  Exam and history consistent with muscular strain.  We discussed treatment options.  Will start meloxicam  and Flexeril .  Also discussed home exercise program and handout was given.  Recommended use of heating pad.  He will let us  know if not proving in the next few weeks and would consider imaging and referral to PT or sports medicine at that time.  Chronic Problems Addressed Today: Diabetes mellitus without complication (HCC) He is currently on Farxiga  10 mg daily, metformin  1000 mg daily, and Ozempic  2 mg weekly.  Last A1c was 8.1 however this was before we increase the dose of Ozempic .  He will be coming back in a couple of weeks to recheck A1c.  Erectile dysfunction Stable on Cialis  10 mg daily as needed.  Will refill today.     Subjective:  HPI:  See assessment / plan for status of chronic conditions.    Discussed the use of AI scribe software for clinical note transcription with the patient, who gave verbal consent to proceed.  History of Present Illness Preston Gamble is a 63 year old male who presents with worsening lower back pain.  He has been experiencing lower back pain for about a month, which began after moving his son out of school. The pain has gradually worsened, making it difficult for him to stand at times. It is primarily located in the lower back and occasionally radiates down his leg. Certain movements, such as sitting or turning, exacerbate the pain. He has been taking Advil for pain relief, but it provides minimal relief. No numbness or tingling associated with the pain. This is the first occurrence of such back pain, and he is concerned about his ability to perform activities like moving furniture.  No reported bowel or bladder incontinence.  No reported urinary retention.  He is currently taking Cialis .  He has a follow-up appointment scheduled for September 15th for monitoring his blood sugar levels and receiving B12 injections.         Objective:  Physical Exam: BP 110/74   Pulse 64   Temp (!) 97.3 F (36.3 C) (Temporal)   Ht 6' 2.4 (1.89 m)   Wt 232 lb (105.2 kg)   SpO2 96%   BMI 29.47 kg/m   Gen: No acute distress, resting comfortably CV: Regular rate and rhythm with no murmurs appreciated Pulm: Normal work of breathing, clear to auscultation bilaterally with no crackles, wheezes, or rhonchi MUSCULOSKELETAL: - Back: No deformities.  Tender to palpation along right lower lumbar paraspinal muscle groups.  Neurovascular intact distally.  Straight leg raise negative. Neuro: Grossly normal, moves all extremities Psych: Normal affect and thought content      Annaleah Arata M. Kennyth, MD 03/03/2024 10:02 AM

## 2024-03-03 NOTE — Assessment & Plan Note (Signed)
Stable on Cialis 10 mg daily as needed.  Will refill today.

## 2024-03-12 ENCOUNTER — Other Ambulatory Visit: Payer: Self-pay | Admitting: Cardiology

## 2024-03-12 DIAGNOSIS — D225 Melanocytic nevi of trunk: Secondary | ICD-10-CM | POA: Diagnosis not present

## 2024-03-12 DIAGNOSIS — L814 Other melanin hyperpigmentation: Secondary | ICD-10-CM | POA: Diagnosis not present

## 2024-03-12 DIAGNOSIS — D2371 Other benign neoplasm of skin of right lower limb, including hip: Secondary | ICD-10-CM | POA: Diagnosis not present

## 2024-03-12 DIAGNOSIS — L821 Other seborrheic keratosis: Secondary | ICD-10-CM | POA: Diagnosis not present

## 2024-03-12 MED ORDER — ATORVASTATIN CALCIUM 80 MG PO TABS
80.0000 mg | ORAL_TABLET | Freq: Every day | ORAL | 0 refills | Status: DC
Start: 2024-03-12 — End: 2024-03-12

## 2024-03-16 ENCOUNTER — Ambulatory Visit

## 2024-03-16 ENCOUNTER — Other Ambulatory Visit

## 2024-03-26 ENCOUNTER — Other Ambulatory Visit (HOSPITAL_BASED_OUTPATIENT_CLINIC_OR_DEPARTMENT_OTHER): Payer: Self-pay

## 2024-03-26 ENCOUNTER — Other Ambulatory Visit: Payer: Self-pay | Admitting: Family Medicine

## 2024-03-26 ENCOUNTER — Other Ambulatory Visit: Payer: Self-pay | Admitting: Cardiology

## 2024-03-26 MED ORDER — OZEMPIC (2 MG/DOSE) 8 MG/3ML ~~LOC~~ SOPN
2.0000 mg | PEN_INJECTOR | SUBCUTANEOUS | 1 refills | Status: DC
  Filled 2024-03-26 – 2024-04-12 (×2): qty 3, 28d supply, fill #0
  Filled 2024-05-09: qty 3, 28d supply, fill #1

## 2024-03-30 ENCOUNTER — Other Ambulatory Visit

## 2024-03-30 ENCOUNTER — Ambulatory Visit (INDEPENDENT_AMBULATORY_CARE_PROVIDER_SITE_OTHER): Admitting: *Deleted

## 2024-03-30 DIAGNOSIS — E538 Deficiency of other specified B group vitamins: Secondary | ICD-10-CM

## 2024-03-30 DIAGNOSIS — Z23 Encounter for immunization: Secondary | ICD-10-CM | POA: Diagnosis not present

## 2024-03-30 MED ORDER — CYANOCOBALAMIN 1000 MCG/ML IJ SOLN
1000.0000 ug | Freq: Once | INTRAMUSCULAR | Status: AC
  Administered 2024-03-30: 1000 ug via INTRAMUSCULAR

## 2024-03-30 NOTE — Progress Notes (Signed)
 Patient presents for B12 injection today. Patient received her B12 injection in Right Deltoid. Patient tolerated injection well.  Documentation entered in [facility] in EpicCare.    Flu vaccine given on Left deltoid  Patient tolerated well

## 2024-03-31 ENCOUNTER — Other Ambulatory Visit: Payer: Self-pay | Admitting: Family Medicine

## 2024-04-05 ENCOUNTER — Other Ambulatory Visit: Payer: Self-pay | Admitting: Cardiology

## 2024-04-05 ENCOUNTER — Telehealth: Payer: Self-pay | Admitting: Cardiology

## 2024-04-05 MED ORDER — ATORVASTATIN CALCIUM 80 MG PO TABS: 80.0000 mg | ORAL_TABLET | Freq: Every day | ORAL | 1 refills | Status: DC

## 2024-04-05 NOTE — Telephone Encounter (Signed)
 Pt's medication was sent to pt's pharmacy as requested. Confirmation received.

## 2024-04-05 NOTE — Telephone Encounter (Signed)
*  STAT* If patient is at the pharmacy, call can be transferred to refill team.   1. Which medications need to be refilled? (please list name of each medication and dose if known) atorvastatin  (LIPITOR ) 80 MG tablet    2. Would you like to learn more about the convenience, safety, & potential cost savings by using the [facility]? NO   3. Are you open to using the [facility] NO   4. Which pharmacy/location (including street and city if local pharmacy) is medication to be sent to?  [id number] - [place], [place] - [address] AT [facility] [address]     5. Do they need a 30 day or 90 day supply? 90  Patient has been out of medication for 2 weeks, has appt 11/21 with Dr. Shlomo

## 2024-04-12 ENCOUNTER — Other Ambulatory Visit (HOSPITAL_BASED_OUTPATIENT_CLINIC_OR_DEPARTMENT_OTHER): Payer: Self-pay

## 2024-04-13 ENCOUNTER — Ambulatory Visit: Payer: Self-pay | Admitting: Family Medicine

## 2024-04-13 ENCOUNTER — Other Ambulatory Visit (INDEPENDENT_AMBULATORY_CARE_PROVIDER_SITE_OTHER)

## 2024-04-13 DIAGNOSIS — E538 Deficiency of other specified B group vitamins: Secondary | ICD-10-CM

## 2024-04-13 LAB — VITAMIN B12: Vitamin B-12: 304 pg/mL (ref 211–911)

## 2024-04-13 NOTE — Progress Notes (Signed)
 B12 is improving though still in the lower range of normal.  It is okay for him to transition to oral supplementation 1000 mcg daily.  We should recheck again in 3 to 6 months.

## 2024-05-03 ENCOUNTER — Other Ambulatory Visit: Payer: Self-pay | Admitting: Family Medicine

## 2024-05-28 ENCOUNTER — Ambulatory Visit: Admitting: Cardiology

## 2024-05-28 MED ORDER — METFORMIN HCL 1000 MG PO TABS: 1000.0000 mg | ORAL_TABLET | Freq: Every day | ORAL | 3 refills | Status: AC

## 2024-05-28 NOTE — Addendum Note (Signed)
 Addended by: FRANCIS ROULEAU A on: 05/28/2024 04:28 PM   Modules accepted: Orders

## 2024-05-31 ENCOUNTER — Encounter: Payer: Self-pay | Admitting: Cardiology

## 2024-05-31 ENCOUNTER — Other Ambulatory Visit (HOSPITAL_BASED_OUTPATIENT_CLINIC_OR_DEPARTMENT_OTHER): Payer: Self-pay

## 2024-05-31 ENCOUNTER — Other Ambulatory Visit: Payer: Self-pay | Admitting: Family Medicine

## 2024-05-31 MED ORDER — OZEMPIC (2 MG/DOSE) 8 MG/3ML ~~LOC~~ SOPN
2.0000 mg | PEN_INJECTOR | SUBCUTANEOUS | 1 refills | Status: AC
  Filled 2024-05-31 – 2024-06-04 (×2): qty 3, 28d supply, fill #0
  Filled 2024-07-02: qty 3, 28d supply, fill #1

## 2024-06-01 MED ORDER — ATORVASTATIN CALCIUM 80 MG PO TABS: 80.0000 mg | ORAL_TABLET | Freq: Every day | ORAL | 0 refills | Status: AC

## 2024-06-02 ENCOUNTER — Other Ambulatory Visit: Payer: Self-pay | Admitting: Cardiology

## 2024-06-02 ENCOUNTER — Other Ambulatory Visit: Payer: Self-pay | Admitting: Family Medicine

## 2024-06-02 NOTE — Telephone Encounter (Signed)
 03/03/2024 LOV  05/03/2024 fill date  15/0 refills

## 2024-06-04 ENCOUNTER — Other Ambulatory Visit (HOSPITAL_BASED_OUTPATIENT_CLINIC_OR_DEPARTMENT_OTHER): Payer: Self-pay

## 2024-06-08 ENCOUNTER — Ambulatory Visit: Attending: General Practice | Admitting: Emergency Medicine

## 2024-06-08 ENCOUNTER — Encounter: Payer: Self-pay | Admitting: General Practice

## 2024-06-08 VITALS — BP 118/70 | HR 68 | Ht 74.4 in | Wt 238.0 lb

## 2024-06-08 DIAGNOSIS — I341 Nonrheumatic mitral (valve) prolapse: Secondary | ICD-10-CM

## 2024-06-08 DIAGNOSIS — R931 Abnormal findings on diagnostic imaging of heart and coronary circulation: Secondary | ICD-10-CM | POA: Diagnosis not present

## 2024-06-08 DIAGNOSIS — I2510 Atherosclerotic heart disease of native coronary artery without angina pectoris: Secondary | ICD-10-CM | POA: Diagnosis not present

## 2024-06-08 DIAGNOSIS — I77810 Thoracic aortic ectasia: Secondary | ICD-10-CM | POA: Diagnosis not present

## 2024-06-08 DIAGNOSIS — E7800 Pure hypercholesterolemia, unspecified: Secondary | ICD-10-CM

## 2024-06-08 MED ORDER — METOPROLOL TARTRATE 50 MG PO TABS: ORAL_TABLET | ORAL | 0 refills | Status: AC

## 2024-06-08 NOTE — Progress Notes (Signed)
 Cardiology Clinic Note   Patient Name: Preston Gamble Date of Encounter: 06/08/2024  Primary Care Provider:  Kennyth Worth HERO, MD Primary Cardiologist:  Wilbert Bihari, MD  Patient Profile    Preston Gamble 63 year old male presents to the clinic today for follow-up evaluation of his coronary artery disease and mitral valve prolapse.  Past Medical History    Past Medical History:  Diagnosis Date   Acquired dilation of ascending aorta and aortic root 05/27/2015   43 mm ascending aorta and 40 mm aortic root by echo 06/2023   Cancer (HCC)    lung cancer- dx 06-2020   Chest pain    due to injury   Diabetes mellitus without complication (HCC)    Family history of early CAD 05/17/2016   Hyperlipidemia    Mitral valve prolapse    Mild MR by echo 06/2023   Umbilical hernia    Past Surgical History:  Procedure Laterality Date   BRONCHIAL BIOPSY  09/06/2020   part of lobe removed (upper) Procedure: BRONCHIAL BIOPSIES;  Surgeon: Brenna Adine CROME, DO;  Location: [facility];  Service: Pulmonary;;   BRONCHIAL BRUSHINGS  09/06/2020   Procedure: BRONCHIAL BRUSHINGS;  Surgeon: Brenna Adine CROME, DO;  Location: [facility];  Service: Pulmonary;;   BRONCHIAL NEEDLE ASPIRATION BIOPSY  09/06/2020   Procedure: BRONCHIAL NEEDLE ASPIRATION BIOPSIES;  Surgeon: Brenna Adine CROME, DO;  Location: [facility];  Service: Pulmonary;;   COLONOSCOPY     HERNIA REPAIR     INTERCOSTAL NERVE BLOCK Left 09/06/2020   Procedure: INTERCOSTAL NERVE BLOCK;  Surgeon: Shyrl Linnie KIDD, MD;  Location: [facility];  Service: Thoracic;  Laterality: Left;   NECK SURGERY     PLANTAR FASCIA RELEASE     SCLEROTHERAPY  09/06/2020   Procedure: SCLEROTHERAPY;  Surgeon: Brenna Adine CROME, DO;  Location: [facility];  Service: Pulmonary;;   SEPTOPLASTY  1994   UMBILICAL HERNIA REPAIR  07/09/2012   Procedure: HERNIA REPAIR UMBILICAL ADULT;  Surgeon: Sherlean JINNY Laughter, MD;  Location: [facility];  Service:  General;  Laterality: N/A;  repair umbilical hernia   VIDEO BRONCHOSCOPY WITH ENDOBRONCHIAL NAVIGATION Left 09/06/2020   Procedure: VIDEO BRONCHOSCOPY WITH ENDOBRONCHIAL NAVIGATION;  Surgeon: Brenna Adine CROME, DO;  Location: [facility];  Service: Pulmonary;  Laterality: Left;  biopsies and fiducial dye marking, MB + ICG    Allergies  No Known Allergies  History of Present Illness    Preston Gamble has a PMH of mitral valve prolapse, dilated aortic root, hyperlipidemia, left upper lobe adenocarcinoma (status post lobectomy), and coronary artery disease.  He had a coronary calcium  scoring which showed a score of 101 and placed him in the 77th percentile for age and sex matched controls.  He was seen in follow-up by Dr. Bihari on 12/26/2022.  During that time he remained stable from a cardiac standpoint.  He denied chest pain and pressure.  He denied shortness of breath and dyspnea.  He denied presyncope and syncope.  He denied palpitations.  He was tolerating his medications well.  His EKG showed sinus bradycardia.  Presents independently, appears to be doing well from a cardiovascular standpoint. He denies chest pain, palpitations, dyspnea, orthopnea, n, v,  dark/tarry/bloody stools, hematuria, dizziness, syncope, edema, weight gain.  His primary concern was in regards to his previous result of his mild-moderately elevated coronary calcium  score of 101 in 2021.  States his oncologist told him he needed to have this further evaluated.  Reports his  regular exercise includes walking 3-4 miles a day.  Occasionally experiences DOE when having to walk up long hills, does not need to stop for a break when walking up long hills, questions whether this could be related to his history of a previous left lobectomy.  Does not feel as though his stamina has changed.   Home Medications    Prior to Admission medications   Medication Sig Start Date End Date Taking? Authorizing Provider  aspirin  EC 81 MG  tablet Take 81 mg by mouth daily.    [provider]  atorvastatin  (LIPITOR ) 80 MG tablet Take 1 tablet (80 mg total) by mouth daily. 06/01/24   Shlomo Wilbert SAUNDERS, MD  cholecalciferol (VITAMIN D3) 25 MCG (1000 UNIT) tablet Take 1,000 Units by mouth daily.    [provider]  cyclobenzaprine  (FLEXERIL ) 10 MG tablet Take 1 tablet (10 mg total) by mouth 3 (three) times daily as needed for muscle spasms. 03/03/24   Kennyth Worth HERO, MD  FARXIGA  10 MG TABS tablet Take 1 tablet (10 mg total) by mouth daily. 02/06/24   Kennyth Worth HERO, MD  meloxicam  (MOBIC ) 15 MG tablet TAKE 1 TABLET(15 MG) BY MOUTH DAILY 06/02/24   Kennyth Worth HERO, MD  metFORMIN  (GLUCOPHAGE ) 1000 MG tablet Take 1 tablet (1,000 mg total) by mouth daily. 05/28/24   Kennyth Worth HERO, MD  Semaglutide , 2 MG/DOSE, (OZEMPIC , 2 MG/DOSE,) 8 MG/3ML SOPN Inject 2 mg as directed once a week. 05/31/24   Kennyth Worth HERO, MD  tadalafil  (CIALIS ) 10 MG tablet Take 1 tablet (10 mg total) by mouth daily. 03/03/24   Kennyth Worth HERO, MD    Family History    Family History  Problem Relation Age of Onset   CAD Father    Diabetes Father    Hyperlipidemia Sister    Diabetes Sister    Diabetes Maternal Grandmother    Diabetes Maternal Grandfather    Diabetes Paternal Grandmother    Diabetes Paternal Grandfather    Colon cancer Neg Hx    Pancreatic cancer Neg Hx    Stomach cancer Neg Hx    Esophageal cancer Neg Hx    Rectal cancer Neg Hx    He indicated that his mother is alive. He indicated that his father is alive. He indicated that his sister is alive. He indicated that his brother is alive. He indicated that his maternal grandmother is deceased. He indicated that his maternal grandfather is deceased. He indicated that his paternal grandmother is deceased. He indicated that his paternal grandfather is deceased. He indicated that the status of his neg hx is unknown.  Social History    Social History   Socioeconomic History   Marital  status: Married    Spouse name: Not on file   Number of children: Not on file   Years of education: Not on file   Highest education level: Not on file  Occupational History   Not on file  Tobacco Use   Smoking status: Former    Current packs/day: 0.00    Types: Cigarettes    Quit date: 05/20/1997    Years since quitting: 27.0   Smokeless tobacco: Never  Vaping Use   Vaping status: Never Used  Substance and Sexual Activity   Alcohol use: Yes    Alcohol/week: 3.0 standard drinks of alcohol    Types: 3 Cans of beer per week    Comment: occasional   Drug use: No   Sexual activity: Not on file  Other Topics Concern   Not on file  Social History Narrative   Not on file   Social Drivers of Health   Financial Resource Strain: Not on file  Food Insecurity: Not on file  Transportation Needs: Not on file  Physical Activity: Not on file  Stress: Not on file  Social Connections: Not on file  Intimate Partner Violence: Not on file     Review of Systems    See HPI. All other systems reviewed and are otherwise negative except as noted above.  Physical Exam    VS:  BP 118/70   Pulse 68   Ht 6' 2.4 (1.89 m)   Wt 238 lb (108 kg)   SpO2 96%   BMI 30.23 kg/m  , BMI Body mass index is 30.23 kg/m. GEN: Well nourished, well developed, in no acute distress. HEENT: normal. Neck: Supple, carotid bruits, or masses. Cardiac: RRR, no murmurs, rubs, or gallops. No clubbing, cyanosis, edema.   Respiratory:  Respirations regular and unlabored, clear to auscultation bilaterally. Skin: warm and dry, no rash. Neuro:  Strength and sensation are intact. Psych: Normal affect.  Accessory Clinical Findings    Recent Labs: 11/13/2023: TSH 1.42 11/17/2023: ALT 26; BUN 14; Creatinine 1.01; Hemoglobin 17.1; Platelet Count 217; Potassium 4.0; Sodium 137   Recent Lipid Panel    Component Value Date/Time   CHOL 108 11/13/2023 0800   CHOL 126 11/20/2021 0925   TRIG 122.0 11/13/2023 0800   HDL  37.90 (L) 11/13/2023 9199   HDL 43 11/20/2021 0925   CHOLHDL 3 11/13/2023 0800   VLDL 24.4 11/13/2023 0800   LDLCALC 46 11/13/2023 0800   LDLCALC 64 11/20/2021 0925         ECG personally reviewed by me today- EKG Interpretation Date/Time:  Tuesday June 08 2024 11:21:38 EST Ventricular Rate:  63 PR Interval:  178 QRS Duration:  80 QT Interval:  402 QTC Calculation: 411 R Axis:   39  Text Interpretation: Normal sinus rhythm Normal ECG When compared with ECG of 26-Dec-2022 13:02, No significant change was found Confirmed by Emelia Hazy [phone]) on 06/08/2024 11:28:09 AM    Echocardiogram 06/17/2022 IMPRESSIONS     1. Artifact in some apical images tht suggest Lateral area of dyskinesis  but not seen in true on axis images with no artifact. Left ventricular  ejection fraction, by estimation, is 55 to 60%. The left ventricle has  normal function. The left ventricle  has no regional wall motion abnormalities. Left ventricular diastolic  parameters were normal.   2. Right ventricular systolic function is normal. The right ventricular  size is normal.   3. Left atrial size was moderately dilated.   4. The mitral valve is abnormal. Trivial mitral valve regurgitation. No  evidence of mitral stenosis.   5. The aortic valve is tricuspid. Aortic valve regurgitation is not  visualized. No aortic stenosis is present.   6. Aortic dilatation noted. There is mild dilatation of the aortic root,  measuring 41 mm. There is mild dilatation of the ascending aorta,  measuring 40 mm.   7. The inferior vena cava is normal in size with greater than 50%  respiratory variability, suggesting right atrial pressure of 3 mmHg.   FINDINGS   Left Ventricle: Artifact in some apical images tht suggest Lateral area  of dyskinesis but not seen in true on axis images with no artifact. Left  ventricular ejection fraction, by estimation, is 55 to 60%. The left  ventricle has normal function.  The left    ventricle has no regional wall motion abnormalities. The left ventricular  internal cavity size was normal in size. There is no left ventricular  hypertrophy. Left ventricular diastolic parameters were normal.   Right Ventricle: The right ventricular size is normal. No increase in  right ventricular wall thickness. Right ventricular systolic function is  normal.   Left Atrium: Left atrial size was moderately dilated.   Right Atrium: Right atrial size was normal in size.   Pericardium: There is no evidence of pericardial effusion.   Mitral Valve: The mitral valve is abnormal. There is mild thickening of  the mitral valve leaflet(s). Trivial mitral valve regurgitation. No  evidence of mitral valve stenosis.   Tricuspid Valve: The tricuspid valve is normal in structure. Tricuspid  valve regurgitation is not demonstrated. No evidence of tricuspid  stenosis.   Aortic Valve: The aortic valve is tricuspid. Aortic valve regurgitation is  not visualized. No aortic stenosis is present.   Pulmonic Valve: The pulmonic valve was normal in structure. Pulmonic valve  regurgitation is trivial. No evidence of pulmonic stenosis.   Aorta: Aortic dilatation noted. There is mild dilatation of the aortic  root, measuring 41 mm. There is mild dilatation of the ascending aorta,  measuring 40 mm.   Venous: The inferior vena cava is normal in size with greater than 50%  respiratory variability, suggesting right atrial pressure of 3 mmHg.   IAS/Shunts: No atrial level shunt detected by color flow Doppler.   Echocardiogram 06/17/2023  IMPRESSIONS     1. Left ventricular ejection fraction, by estimation, is 60 to 65%. Left  ventricular ejection fraction by 3D volume is 61 %. The left ventricle has  normal function. The left ventricle has no regional wall motion  abnormalities. There is mild left  ventricular hypertrophy. Left ventricular diastolic parameters were  normal. The average left  ventricular global longitudinal strain is -19.8  %. The global longitudinal strain is normal.   2. Right ventricular systolic function is normal. The right ventricular  size is normal. Tricuspid regurgitation signal is inadequate for assessing  PA pressure.   3. The mitral valve is grossly normal. Mild mitral valve regurgitation.  No evidence of mitral stenosis. There is mild prolapse of of the mitral  valve.   4. The aortic valve is tricuspid. Aortic valve regurgitation is not  visualized. No aortic stenosis is present.   5. Aortic dilatation noted. There is dilatation of the aortic root,  measuring 40 mm. There is dilatation of the ascending aorta, measuring 43  mm.   6. The inferior vena cava is dilated in size with >50% respiratory  variability, suggesting right atrial pressure of 8 mmHg.   Comparison(s): A prior study was performed on 06/17/2022. Aortic root was  reported 41mm and ascending aorta was reported 40mm.   FINDINGS   Left Ventricle: Left ventricular ejection fraction, by estimation, is 60  to 65%. Left ventricular ejection fraction by 3D volume is 61 %. The left  ventricle has normal function. The left ventricle has no regional wall  motion abnormalities. The average  left ventricular global longitudinal strain is -19.8 %. The global  longitudinal strain is normal. The left ventricular internal cavity size  was normal in size. There is mild left ventricular hypertrophy. Left  ventricular diastolic parameters were normal.   Right Ventricle: The right ventricular size is normal. Right vetricular  wall thickness was not well visualized. Right ventricular systolic  function is normal. Tricuspid regurgitation  signal is inadequate for  assessing PA pressure.   Left Atrium: Left atrial size was normal in size.   Right Atrium: Right atrial size was normal in size.   Pericardium: There is no evidence of pericardial effusion.   Mitral Valve: The mitral valve is grossly  normal. There is mild prolapse  of of the mitral valve. Mild mitral valve regurgitation. No evidence of  mitral valve stenosis.   Tricuspid Valve: The tricuspid valve is normal in structure. Tricuspid  valve regurgitation is trivial. No evidence of tricuspid stenosis.   Aortic Valve: The aortic valve is tricuspid. Aortic valve regurgitation is  not visualized. No aortic stenosis is present.   Pulmonic Valve: The pulmonic valve was not well visualized. Pulmonic valve  regurgitation is not visualized. No evidence of pulmonic stenosis.   Aorta: Aortic dilatation noted. There is dilatation of the aortic root,  measuring 40 mm. There is dilatation of the ascending aorta, measuring 43  mm.   Venous: The inferior vena cava is dilated in size with greater than 50%  respiratory variability, suggesting right atrial pressure of 8 mmHg.   IAS/Shunts: The interatrial septum was not well visualized.     Assessment & Plan   Coronary artery disease-no chest pain today. EKG: NSR today.  Denies exertional chest discomfort.  Prior coronary calcium  scoring showed a value of 101.  He underwent treadmill stress test 12/21 which showed no ischemia.  Patient expresses significant concern over the finding of coronary artery disease on his previous calcium  scoring.  States his oncologist told him he needed further follow-up of this.  We discussed what the findings implied, and patient is requesting further work up to rule out significant coronary artery disease. Will order coronary CTA given hx of mild-moderate calcifications on previous exam.   Mitral valve prolapse-no increased DOE activity intolerance.  Echocardiogram 12/24 showed an LVEF of 60 to 65% and mild mitral valve regurgitation, mild prolapse of the mitral valve was noted. Dr. Shlomo has already placed order for repeat echo, which is scheduled next week.   Aortic root dilation-denies chest and back discomfort.  Echocardiogram 06/17/2023 showed  aortic root dilation of 40 mm and dilation of the ascending aorta measuring 43 mm.  Stable from previous 12/23 echo Repeat echocardiogram  Hyperlipidemia - LDL 46. High-fiber diet Maintain physical activity Continue statin therapy, aspirin  therapy  Disposition: Follow-up with Dr. Shlomo or me in 12 months.

## 2024-06-08 NOTE — Patient Instructions (Addendum)
 Medication Instructions:  Your physician recommends that you continue on your current medications as directed. Please refer to the Current Medication list given to you today. *If you need a refill on your cardiac medications before your next appointment, please call your pharmacy*  Lab Work: TODAY-BMET If you have labs (blood work) drawn today and your tests are completely normal, you will receive your results only by: MyChart Message (if you have MyChart) OR A paper copy in the mail If you have any lab test that is abnormal or we need to change your treatment, we will call you to review the results.  Testing/Procedures: CORONARY CTA INSTRUCTION BELOW  Follow-Up: At [facility], you and your health needs are our priority.  As part of our continuing mission to provide you with exceptional heart care, our providers are all part of one team.  This team includes your primary Cardiologist (physician) and Advanced Practice Providers or APPs (Physician Assistants and Nurse Practitioners) who all work together to provide you with the care you need, when you need it.  Your next appointment:   3 month(s)  Provider:   Wilbert Bihari, MD    We recommend signing up for the patient portal called MyChart.  Sign up information is provided on this After Visit Summary.  MyChart is used to connect with patients for Virtual Visits (Telemedicine).  Patients are able to view lab/test results, encounter notes, upcoming appointments, etc.  Non-urgent messages can be sent to your provider as well.   To learn more about what you can do with MyChart, go to [email or web address].   Other Instructions    Your cardiac CT will be scheduled at one of the below locations:   [facility] [address]  [place], [place] [address]  If scheduled at the [facility] at [address], please enter the parking lot using the [address] entrance and use the FREE  valet service at the patient drop-off area. Enter the building and check-in with registration on the main floor.  Please follow these instructions carefully (unless otherwise directed):  An IV will be required for this test and Nitroglycerin will be given.  Hold all erectile dysfunction medications at least 3 days (72 hrs) prior to test. (Ie viagra, cialis , sildenafil, tadalafil , etc)   On the Night Before the Test: Be sure to Drink plenty of water. Do not consume any caffeinated/decaffeinated beverages or chocolate 12 hours prior to your test. Do not take any antihistamines 12 hours prior to your test.  On the Day of the Test: Drink plenty of water until 1 hour prior to the test. Do not eat any food 1 hour prior to test. You may take your regular medications prior to the test.  Take metoprolol (Lopressor) two hours prior to test. If you take Furosemide/Hydrochlorothiazide/Spironolactone/Chlorthalidone, please HOLD on the morning of the test. Patients who wear a continuous glucose monitor MUST remove the device prior to scanning.  After the Test: Drink plenty of water. After receiving IV contrast, you may experience a mild flushed feeling. This is normal. On occasion, you may experience a mild rash up to 24 hours after the test. This is not dangerous. If this occurs, you can take Benadryl 25 mg, Zyrtec, Claritin, or Allegra and increase your fluid intake. (Patients taking Tikosyn should avoid Benadryl, and may take Zyrtec, Claritin, or Allegra) If you experience trouble breathing, this can be serious. If it is severe call 911 IMMEDIATELY. If it is  mild, please call our office.  We will call to schedule your test 2-4 weeks out understanding that some insurance companies will need an authorization prior to the service being performed.   For more information and frequently asked questions, please visit our website : [email or web address]  For non-scheduling related questions,  please contact the cardiac imaging nurse navigator should you have any questions/concerns: Cardiac Imaging Nurse Navigators Direct Office Dial: [phone]   For scheduling needs, including cancellations and rescheduling, please call Brittany, [phone].

## 2024-06-09 ENCOUNTER — Other Ambulatory Visit (HOSPITAL_BASED_OUTPATIENT_CLINIC_OR_DEPARTMENT_OTHER): Payer: Self-pay

## 2024-06-09 LAB — BASIC METABOLIC PANEL WITH GFR
BUN/Creatinine Ratio: 14 (ref 10–24)
BUN: 13 mg/dL (ref 8–27)
CO2: 21 mmol/L (ref 20–29)
Calcium: 9.4 mg/dL (ref 8.6–10.2)
Chloride: 103 mmol/L (ref 96–106)
Creatinine, Ser: 0.95 mg/dL (ref 0.76–1.27)
Glucose: 155 mg/dL — ABNORMAL HIGH (ref 70–99)
Potassium: 4.5 mmol/L (ref 3.5–5.2)
Sodium: 141 mmol/L (ref 134–144)
eGFR: 91 mL/min/1.73 (ref >59)

## 2024-06-11 ENCOUNTER — Ambulatory Visit: Payer: Self-pay | Admitting: Emergency Medicine

## 2024-06-14 ENCOUNTER — Ambulatory Visit (HOSPITAL_COMMUNITY): Admission: RE | Admit: 2024-06-14 | Discharge: 2024-06-14 | Attending: Cardiology | Admitting: Cardiology

## 2024-06-14 DIAGNOSIS — I349 Nonrheumatic mitral valve disorder, unspecified: Secondary | ICD-10-CM

## 2024-06-14 DIAGNOSIS — I77810 Thoracic aortic ectasia: Secondary | ICD-10-CM | POA: Diagnosis not present

## 2024-06-14 LAB — ECHOCARDIOGRAM COMPLETE
Area-P 1/2: 2.96 cm2
S' Lateral: 3.04 cm

## 2024-06-15 ENCOUNTER — Encounter: Payer: Self-pay | Admitting: Cardiology

## 2024-06-15 ENCOUNTER — Other Ambulatory Visit: Payer: Self-pay

## 2024-06-15 ENCOUNTER — Ambulatory Visit: Payer: Self-pay | Admitting: Cardiology

## 2024-06-15 DIAGNOSIS — I77810 Thoracic aortic ectasia: Secondary | ICD-10-CM

## 2024-06-15 DIAGNOSIS — I341 Nonrheumatic mitral (valve) prolapse: Secondary | ICD-10-CM

## 2024-06-28 ENCOUNTER — Telehealth (HOSPITAL_COMMUNITY): Payer: Self-pay | Admitting: Emergency Medicine

## 2024-06-28 NOTE — Telephone Encounter (Signed)
 Reaching out to patient to offer assistance regarding upcoming cardiac imaging study; pt verbalizes understanding of appt date/time, parking situation and where to check in, pre-test NPO status and medications ordered, and verified current allergies; name and call back number provided for further questions should they arise Rockwell Alexandria RN Navigator Cardiac Imaging [facility] [phone] office [phone] cell

## 2024-06-29 ENCOUNTER — Ambulatory Visit (HOSPITAL_COMMUNITY)
Admission: RE | Admit: 2024-06-29 | Discharge: 2024-06-29 | Disposition: A | Discharge status: Home / Self Care | Source: Ambulatory Visit | Attending: Internal Medicine | Admitting: Internal Medicine

## 2024-06-29 DIAGNOSIS — R931 Abnormal findings on diagnostic imaging of heart and coronary circulation: Secondary | ICD-10-CM | POA: Insufficient documentation

## 2024-06-29 DIAGNOSIS — E7800 Pure hypercholesterolemia, unspecified: Secondary | ICD-10-CM | POA: Insufficient documentation

## 2024-06-29 DIAGNOSIS — I341 Nonrheumatic mitral (valve) prolapse: Secondary | ICD-10-CM | POA: Insufficient documentation

## 2024-06-29 DIAGNOSIS — I77810 Thoracic aortic ectasia: Secondary | ICD-10-CM | POA: Insufficient documentation

## 2024-06-29 MED ORDER — IOHEXOL 350 MG/ML SOLN
100.0000 mL | Freq: Once | INTRAVENOUS | Status: AC | PRN
  Administered 2024-06-29: 100 mL via INTRAVENOUS

## 2024-06-29 MED ORDER — NITROGLYCERIN 0.4 MG SL SUBL
0.8000 mg | SUBLINGUAL_TABLET | Freq: Once | SUBLINGUAL | Status: AC
  Administered 2024-06-29: 0.8 mg via SUBLINGUAL

## 2024-06-29 NOTE — Progress Notes (Signed)
 Results seen via mychart

## 2024-07-06 ENCOUNTER — Other Ambulatory Visit (HOSPITAL_BASED_OUTPATIENT_CLINIC_OR_DEPARTMENT_OTHER): Payer: Self-pay

## 2024-07-07 ENCOUNTER — Other Ambulatory Visit (HOSPITAL_BASED_OUTPATIENT_CLINIC_OR_DEPARTMENT_OTHER): Payer: Self-pay

## 2024-07-07 MED ORDER — TADALAFIL 10 MG PO TABS
10.0000 mg | ORAL_TABLET | Freq: Every day | ORAL | 11 refills | Status: AC
  Filled 2024-07-07: qty 30, 30d supply, fill #0
  Filled 2024-08-01: qty 30, 30d supply, fill #1

## 2024-07-14 ENCOUNTER — Ambulatory Visit: Admitting: Emergency Medicine

## 2024-08-04 ENCOUNTER — Other Ambulatory Visit (HOSPITAL_BASED_OUTPATIENT_CLINIC_OR_DEPARTMENT_OTHER): Payer: Self-pay

## 2024-08-04 ENCOUNTER — Other Ambulatory Visit: Payer: Self-pay

## 2024-08-04 LAB — OPHTHALMOLOGY REPORT-SCANNED

## 2024-09-09 ENCOUNTER — Ambulatory Visit: Admitting: Cardiology

## 2024-09-10 ENCOUNTER — Ambulatory Visit: Admitting: Cardiology

## 2024-11-08 ENCOUNTER — Inpatient Hospital Stay

## 2024-11-15 ENCOUNTER — Encounter: Admitting: Family Medicine

## 2024-11-16 ENCOUNTER — Inpatient Hospital Stay: Admitting: Internal Medicine
